# Patient Record
Sex: Male | Born: 1955 | Race: White | Hispanic: No | Marital: Married | Smoking: Former smoker
Health system: Southern US, Community
[De-identification: ages and names within clinical notes are randomized; demographics above are authoritative.]

## PROBLEM LIST (undated history)

## (undated) DIAGNOSIS — G43909 Migraine, unspecified, not intractable, without status migrainosus: Secondary | ICD-10-CM

## (undated) DIAGNOSIS — N189 Chronic kidney disease, unspecified: Secondary | ICD-10-CM

## (undated) DIAGNOSIS — K635 Polyp of colon: Secondary | ICD-10-CM

## (undated) DIAGNOSIS — T7840XA Allergy, unspecified, initial encounter: Secondary | ICD-10-CM

## (undated) DIAGNOSIS — G3184 Mild cognitive impairment, so stated: Secondary | ICD-10-CM

## (undated) DIAGNOSIS — C801 Malignant (primary) neoplasm, unspecified: Secondary | ICD-10-CM

## (undated) DIAGNOSIS — R208 Other disturbances of skin sensation: Secondary | ICD-10-CM

## (undated) DIAGNOSIS — I10 Essential (primary) hypertension: Secondary | ICD-10-CM

## (undated) HISTORY — DX: Migraine, unspecified, not intractable, without status migrainosus: G43.909

## (undated) HISTORY — PX: VARICOSE VEIN SURGERY: SHX832

## (undated) HISTORY — DX: Malignant (primary) neoplasm, unspecified: C80.1

## (undated) HISTORY — DX: Chronic kidney disease, unspecified: N18.9

## (undated) HISTORY — DX: Essential (primary) hypertension: I10

## (undated) HISTORY — DX: Mild cognitive impairment, so stated: G31.84

## (undated) HISTORY — DX: Allergy, unspecified, initial encounter: T78.40XA

## (undated) HISTORY — DX: Other disturbances of skin sensation: R20.8

## (undated) HISTORY — DX: Polyp of colon: K63.5

---
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
## 1999-07-15 DIAGNOSIS — C4491 Basal cell carcinoma of skin, unspecified: Secondary | ICD-10-CM

## 1999-07-15 HISTORY — DX: Basal cell carcinoma of skin, unspecified: C44.91

## 2009-04-06 HISTORY — PX: OTHER SURGICAL HISTORY: SHX169

## 2009-04-18 ENCOUNTER — Encounter: Admission: RE | Admit: 2009-04-18 | Discharge: 2009-04-18 | Payer: Self-pay | Admitting: Orthopedic Surgery

## 2010-04-06 HISTORY — PX: ABDOMINAL HERNIA REPAIR: SHX539

## 2010-04-28 ENCOUNTER — Encounter
Admission: RE | Admit: 2010-04-28 | Discharge: 2010-04-28 | Payer: Self-pay | Source: Home / Self Care | Attending: General Surgery | Admitting: General Surgery

## 2010-11-10 ENCOUNTER — Other Ambulatory Visit: Payer: Self-pay | Admitting: *Deleted

## 2010-11-10 DIAGNOSIS — M545 Low back pain, unspecified: Secondary | ICD-10-CM

## 2010-11-16 ENCOUNTER — Ambulatory Visit
Admission: RE | Admit: 2010-11-16 | Discharge: 2010-11-16 | Disposition: A | Discharge status: Home / Self Care | Payer: BC Managed Care – PPO | Source: Ambulatory Visit | Attending: *Deleted | Admitting: *Deleted

## 2010-11-16 DIAGNOSIS — M545 Low back pain, unspecified: Secondary | ICD-10-CM

## 2011-04-07 DIAGNOSIS — C801 Malignant (primary) neoplasm, unspecified: Secondary | ICD-10-CM

## 2011-04-07 HISTORY — DX: Malignant (primary) neoplasm, unspecified: C80.1

## 2011-07-14 DIAGNOSIS — D229 Melanocytic nevi, unspecified: Secondary | ICD-10-CM

## 2011-07-14 HISTORY — DX: Melanocytic nevi, unspecified: D22.9

## 2011-09-08 ENCOUNTER — Ambulatory Visit: Payer: BC Managed Care – PPO | Admitting: Family Medicine

## 2011-09-09 ENCOUNTER — Encounter: Payer: Self-pay | Admitting: Family Medicine

## 2011-09-09 ENCOUNTER — Ambulatory Visit (INDEPENDENT_AMBULATORY_CARE_PROVIDER_SITE_OTHER): Payer: BC Managed Care – PPO | Admitting: Family Medicine

## 2011-09-09 VITALS — BP 160/110 | HR 72 | Temp 97.7°F | Resp 12 | Ht 72.0 in | Wt 209.0 lb

## 2011-09-09 DIAGNOSIS — N200 Calculus of kidney: Secondary | ICD-10-CM

## 2011-09-09 DIAGNOSIS — K635 Polyp of colon: Secondary | ICD-10-CM | POA: Insufficient documentation

## 2011-09-09 DIAGNOSIS — F419 Anxiety disorder, unspecified: Secondary | ICD-10-CM | POA: Insufficient documentation

## 2011-09-09 DIAGNOSIS — C4491 Basal cell carcinoma of skin, unspecified: Secondary | ICD-10-CM | POA: Insufficient documentation

## 2011-09-09 DIAGNOSIS — I10 Essential (primary) hypertension: Secondary | ICD-10-CM

## 2011-09-09 DIAGNOSIS — F411 Generalized anxiety disorder: Secondary | ICD-10-CM

## 2011-09-09 DIAGNOSIS — D126 Benign neoplasm of colon, unspecified: Secondary | ICD-10-CM

## 2011-09-09 MED ORDER — LISINOPRIL-HYDROCHLOROTHIAZIDE 10-12.5 MG PO TABS: 1.0000 | ORAL_TABLET | Freq: Every day | ORAL | Status: DC

## 2011-09-09 NOTE — Patient Instructions (Signed)

## 2011-09-09 NOTE — Progress Notes (Signed)
  Subjective:    Patient ID: Micheal Graham, male    DOB: 11/29/1955, 56 y.o.   MRN: [id number]  HPI  New patient to establish care. Patient was seen recently by his primary care physician in another city [place]) and diagnosed with severe hypertension. He had presented there with increasing headaches. Blood pressure reportedly 217/134 5 days ago. Also reportedly had elevated heart rate around 110. Patient was placed on metoprolol 100 mg and headaches have improved. Home blood pressure readings ranging 453-646 systolic. Patient reportedly had lab work done last week and was told was normal. We do not have a copy of his labs. He had previously been diagnosed with mild hypertension several years ago and briefly took low-dose metoprolol then.  Question of social anxiety disorder. He was recently placed by other physician on Xanax 0.5 mg 3 times a day though he states he does not generally feel anxious most days, only in certain situations.  Other medical problems include history of kidney stones, colon polyps, and basal cell skin cancer. Recent dysplastic nevus removed from right leg.  Surgical history significant for appendectomy, varicose vein surgery, right knee arthroscopy, and abdominal hernia surgery.  Family history father had heart disease his 41s. Mother with hypertension. Paternal grandfather with stroke.  Patient is married. Ex-smoker. Quit 2001. Only occasional alcohol use. Walks occasionally for exercise. Works for [employer].   Review of Systems  Constitutional: Negative for fever, chills, activity change and appetite change.  HENT: Negative for trouble swallowing.   Eyes: Negative for visual disturbance.  Respiratory: Negative for cough, shortness of breath and wheezing.   Cardiovascular: Negative for chest pain, palpitations and leg swelling.  Gastrointestinal: Negative for abdominal pain.  Neurological: Positive for headaches. Negative for dizziness, syncope and weakness.    Hematological: Negative for adenopathy. Does not bruise/bleed easily.  Psychiatric/Behavioral: Negative for dysphoric mood. The patient is nervous/anxious.        Objective:   Physical Exam  Constitutional: He is oriented to person, place, and time. He appears well-developed and well-nourished.  HENT:  Mouth/Throat: Oropharynx is clear and moist.  Neck: Neck supple. No thyromegaly present.       No carotid bruits  Cardiovascular: Normal rate and regular rhythm.   Pulmonary/Chest: Effort normal and breath sounds normal. No respiratory distress. He has no wheezes. He has no rales.  Abdominal: Soft. He exhibits no distension and no mass. There is no tenderness. There is no rebound and no guarding.       No abdominal bruits  Musculoskeletal: He exhibits no edema.  Lymphadenopathy:    He has no cervical adenopathy.  Neurological: He is alert and oriented to person, place, and time. No cranial nerve deficit.  Psychiatric: He has a normal mood and affect. His behavior is normal.          Assessment & Plan:  #1 severe hypertension. Still considerably elevated today. Send for old labs. Baseline EKG. Start lisinopril HCTZ and continue metoprolol. Reassess him in 2 weeks. No heavy exertional activity until then. #2 history of basal cell skin cancers followed by dermatology  #3 history of colon polyps #4 history of kidney stones  EKG shows sinus rhythm with no acute changes

## 2011-09-22 ENCOUNTER — Encounter (HOSPITAL_COMMUNITY): Payer: Self-pay | Admitting: Emergency Medicine

## 2011-09-22 DIAGNOSIS — R51 Headache: Secondary | ICD-10-CM | POA: Insufficient documentation

## 2011-09-22 DIAGNOSIS — N289 Disorder of kidney and ureter, unspecified: Secondary | ICD-10-CM | POA: Insufficient documentation

## 2011-09-22 DIAGNOSIS — I10 Essential (primary) hypertension: Secondary | ICD-10-CM | POA: Insufficient documentation

## 2011-09-22 LAB — DIFFERENTIAL
Basophils Absolute: 0 10*3/uL (ref 0.0–0.1)
Basophils Relative: 0 % (ref 0–1)
Eosinophils Absolute: 0.1 10*3/uL (ref 0.0–0.7)
Eosinophils Relative: 1 % (ref 0–5)
Lymphocytes Relative: 16 % (ref 12–46)
Lymphs Abs: 1.7 10*3/uL (ref 0.7–4.0)
Monocytes Absolute: 0.5 10*3/uL (ref 0.1–1.0)
Monocytes Relative: 5 % (ref 3–12)
Neutro Abs: 7.9 10*3/uL — ABNORMAL HIGH (ref 1.7–7.7)
Neutrophils Relative %: 77 % (ref 43–77)

## 2011-09-22 LAB — BASIC METABOLIC PANEL
BUN: 29 mg/dL — ABNORMAL HIGH (ref 6–23)
CO2: 24 mEq/L (ref 19–32)
Calcium: 9.1 mg/dL (ref 8.4–10.5)
Chloride: 98 mEq/L (ref 96–112)
Creatinine, Ser: 1.68 mg/dL — ABNORMAL HIGH (ref 0.50–1.35)
GFR calc Af Amer: 51 mL/min — ABNORMAL LOW (ref >90)
GFR calc non Af Amer: 44 mL/min — ABNORMAL LOW (ref >90)
Glucose, Bld: 101 mg/dL — ABNORMAL HIGH (ref 70–99)
Potassium: 4.6 mEq/L (ref 3.5–5.1)
Sodium: 133 mEq/L — ABNORMAL LOW (ref 135–145)

## 2011-09-22 LAB — CBC
HCT: 39.5 % (ref 39.0–52.0)
Hemoglobin: 13.5 g/dL (ref 13.0–17.0)
MCH: 30.3 pg (ref 26.0–34.0)
MCHC: 34.2 g/dL (ref 30.0–36.0)
MCV: 88.6 fL (ref 78.0–100.0)
Platelets: 221 10*3/uL (ref 150–400)
RBC: 4.46 MIL/uL (ref 4.22–5.81)
RDW: 14.3 % (ref 11.5–15.5)
WBC: 10.3 10*3/uL (ref 4.0–10.5)

## 2011-09-22 NOTE — ED Notes (Signed)
Patient with headache since yesterday at 6pm.  Patient having dizziness, feeling weak, nauseated and feels like he might pass out.  Patient is flushed.  Mild photophobia.

## 2011-09-23 ENCOUNTER — Emergency Department (HOSPITAL_COMMUNITY)
Admission: EM | Admit: 2011-09-23 | Discharge: 2011-09-23 | Disposition: A | Discharge status: Home / Self Care | Payer: BC Managed Care – PPO | Attending: Emergency Medicine | Admitting: Emergency Medicine

## 2011-09-23 ENCOUNTER — Ambulatory Visit (INDEPENDENT_AMBULATORY_CARE_PROVIDER_SITE_OTHER): Payer: BC Managed Care – PPO | Admitting: Family Medicine

## 2011-09-23 ENCOUNTER — Encounter: Payer: Self-pay | Admitting: Family Medicine

## 2011-09-23 ENCOUNTER — Emergency Department (HOSPITAL_COMMUNITY): Payer: BC Managed Care – PPO

## 2011-09-23 ENCOUNTER — Encounter (HOSPITAL_COMMUNITY): Payer: Self-pay | Admitting: Radiology

## 2011-09-23 VITALS — BP 120/86 | Temp 98.0°F | Wt 203.0 lb

## 2011-09-23 DIAGNOSIS — I10 Essential (primary) hypertension: Secondary | ICD-10-CM

## 2011-09-23 DIAGNOSIS — R7989 Other specified abnormal findings of blood chemistry: Secondary | ICD-10-CM

## 2011-09-23 DIAGNOSIS — R42 Dizziness and giddiness: Secondary | ICD-10-CM

## 2011-09-23 DIAGNOSIS — N289 Disorder of kidney and ureter, unspecified: Secondary | ICD-10-CM

## 2011-09-23 DIAGNOSIS — R51 Headache: Secondary | ICD-10-CM

## 2011-09-23 DIAGNOSIS — R799 Abnormal finding of blood chemistry, unspecified: Secondary | ICD-10-CM

## 2011-09-23 MED ORDER — NAPROXEN 500 MG PO TABS: 500.0000 mg | ORAL_TABLET | Freq: Two times a day (BID) | ORAL | Status: DC

## 2011-09-23 MED ORDER — LISINOPRIL 10 MG PO TABS: 10.0000 mg | ORAL_TABLET | Freq: Every day | ORAL | Status: DC

## 2011-09-23 NOTE — Progress Notes (Signed)
  Subjective:    Patient ID: Micheal Graham, male    DOB: 05/10/1955, 56 y.o.   MRN: [id number]  HPI  Patient seen for followup. Went to emergency room last night with severe headache. More generalized headache. CT of head unremarkable. Had been seen here recently with severe hypertension. Had been already started on metoprolol and we added lisinopril HCTZ.   After just two days had some dizziness due to orthostatic type symptoms. He had some standing blood pressures around 532 systolic at home. Headache is fully resolved this time.    Patient had CT head unremarkable. Labs significant for creatinine 1.68 which is up from recent creatinine in late May of 1.17. Patient might have been somewhat dehydrated because of headache issues. His BUN was also elevated at 29. Sodium 133. CBC normal. Potassium normal.  Past Medical History  Diagnosis Date  . 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 Allergy   . Hypertension   . Chronic kidney disease   . Migraines   . Colon polyps    Past Surgical History  Procedure Date  . Appendectomy 1974    reports that he quit smoking about 12 years ago. His smoking use included Cigarettes. He has a 60 pack-year smoking history. He does not have any smokeless tobacco history on file. He reports that he does not drink alcohol or use illicit drugs. family history includes Cancer in his maternal uncle; Heart disease in his father; Hypertension in his mother; and Stroke in his paternal grandfather. No Known Allergies   Review of Systems  Constitutional: Negative for fatigue.  Eyes: Negative for visual disturbance.  Respiratory: Negative for cough, chest tightness and shortness of breath.   Cardiovascular: Negative for chest pain, palpitations and leg swelling.  Neurological: Negative for dizziness, syncope, weakness, light-headedness and headaches (none today).       Objective:   Physical Exam  Constitutional: He is oriented to person, place, and time. He appears  well-developed and well-nourished.  Neck: Neck supple. No thyromegaly present.  Cardiovascular: Normal rate and regular rhythm.  Exam reveals no gallop.   Pulmonary/Chest: Effort normal and breath sounds normal. No respiratory distress. He has no wheezes. He has no rales.  Lymphadenopathy:    He has no cervical adenopathy.  Neurological: He is alert and oriented to person, place, and time. No cranial nerve deficit.  Psychiatric: He has a normal mood and affect. His behavior is normal.          Assessment & Plan:  Severe hypertension tremendously improved. In fact he now has some orthostatic symptoms intermittently. Sitting blood pressure left arm today 101/68 and decreased to 90/60 standing. Discontinue lisinopril HCTZ and continue lisinopril 10 mg daily and Toprol. Recent elevation in creatinine-recheck basic metabolic panel in 2 weeks. Suspect this was dehydration related.  If creatinine still elevated at that point stop ACE inhibitor.

## 2011-09-23 NOTE — ED Notes (Signed)
Patient with dizziness, headache and feeling like he was going to pass out.  Patient has been diagnosed of recent hypertension and on two new medications.  Patient with equal hand grips, pedal pushes, no slurred speech, no facial droop.  Patient is CAOx3, family at bedside.

## 2011-09-23 NOTE — Discharge Instructions (Signed)
Your CT scan was normal, your blood work show that your kidneys are not working perfectly. I have attached her laboratory data so that he can shared this with your physician this week. Return to the hospital for severe or worsening pain, weakness, numbness or change in vision. Please take the Naprosyn twice a day to prevent headaches from coming on.

## 2011-09-23 NOTE — ED Provider Notes (Signed)
History     CSN: [id number]  Arrival date & time 09/22/11  1955   First MD Initiated Contact with Patient 09/23/11 0036      Chief Complaint  Patient presents with  . Dizziness  . Headache  . Blurred Vision    (Consider location/radiation/quality/duration/timing/severity/associated sxs/prior treatment) HPI Comments: 56 year old male with a history of recent diagnosis of hypertension who has started both a beta blocker, ACE inhibitor and hydrochlorothiazide in the last 3 weeks. He states that over the last 2 days he has had an intermittent headache, it starts in his right temporal area, spreads across his head and in supple and the left temporal area. This resolved spontaneously while he was waiting in the waiting room this evening. He admits to having associated strange symptoms in his eyes, nausea and a feeling of his whole body going weak though he did not notice that it was actually weak. He states he felt like he could not use his arms or legs right though they performed normal when he tried to function. There was no difficulty ambulating, has been no fevers, no stiff neck, at this time he has no complaints except for a feeling of a mild numbness on the top of his head. He denies chest pain, neck pain, back pain, swelling, rashes, belly pain. He does not have a history of seizures, no history of migraines, no history of trauma to the head.  Patient is a 56 y.o. male presenting with headaches. The history is provided by the patient, the spouse and a relative.  Headache     Past Medical History  Diagnosis Date  . 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 Allergy   . Hypertension   . Chronic kidney disease   . Migraines   . Colon polyps     Past Surgical History  Procedure Date  . Appendectomy 1974    Family History  Problem Relation Age of Onset  . Hypertension Mother   . Heart disease Father   . Cancer Maternal Uncle     prostate  . Stroke Paternal Grandfather     History    Substance Use Topics  . Smoking status: Former Smoker -- 2.0 packs/day for 30 years    Types: Cigarettes    Quit date: 09/09/1999  . Smokeless tobacco: Not on file  . Alcohol Use: No      Review of Systems  Neurological: Positive for headaches.  All other systems reviewed and are negative.    Allergies  Review of patient's allergies indicates no known allergies.  Home Medications   Current Outpatient Rx  Name Route Sig Dispense Refill  . ALPRAZOLAM 0.5 MG PO TABS Oral Take 0.5 mg by mouth 3 (three) times daily as needed. anxiety    . FEXOFENADINE HCL 180 MG PO TABS Oral Take 180 mg by mouth daily.    Marland Kitchen LISINOPRIL-HYDROCHLOROTHIAZIDE 10-12.5 MG PO TABS Oral Take 1 tablet by mouth daily. 30 tablet 11  . METOPROLOL SUCCINATE ER 100 MG PO TB24 Oral Take 100 mg by mouth daily. Take with or immediately following a meal.    . NAPROXEN 500 MG PO TABS Oral Take 1 tablet (500 mg total) by mouth 2 (two) times daily with a meal. 30 tablet 0    BP 130/77  Pulse 64  Temp 97.9 F (36.6 C) (Oral)  Resp 19  SpO2 100%  Physical Exam  Nursing note and vitals reviewed. Constitutional: He appears well-developed and well-nourished. No distress.  HENT:  Head: Normocephalic and atraumatic.  Mouth/Throat: Oropharynx is clear and moist. No oropharyngeal exudate.  Eyes: Conjunctivae and EOM are normal. Pupils are equal, round, and reactive to light. Right eye exhibits no discharge. Left eye exhibits no discharge. No scleral icterus.  Neck: Normal range of motion. Neck supple. No JVD present. No thyromegaly present.  Cardiovascular: Normal rate, regular rhythm, normal heart sounds and intact distal pulses.  Exam reveals no gallop and no friction rub.   No murmur heard. Pulmonary/Chest: Effort normal and breath sounds normal. No respiratory distress. He has no wheezes. He has no rales.  Abdominal: Soft. Bowel sounds are normal. He exhibits no distension and no mass. There is no tenderness.   Musculoskeletal: Normal range of motion. He exhibits no edema and no tenderness.  Lymphadenopathy:    He has no cervical adenopathy.  Neurological: He is alert. Coordination normal.       Neurologic exam:  Speech clear, pupils equal round reactive to light, extraocular movements intact  Normal peripheral visual fields Cranial nerves III through XII normal including no facial droop Follows commands, moves all extremities x4, normal strength to bilateral upper and lower extremities at all major muscle groups including grip Sensation normal to light touch and pinprick Coordination intact, no limb ataxia, finger-nose-finger normal Rapid alternating movements normal No pronator drift Gait normal   Skin: Skin is warm and dry. No rash noted. No erythema.  Psychiatric: He has a normal mood and affect. His behavior is normal.    ED Course  Procedures (including critical care time)  Labs Reviewed  DIFFERENTIAL - Abnormal; Notable for the following:    Neutro Abs 7.9 (*)     All other components within normal limits  BASIC METABOLIC PANEL - Abnormal; Notable for the following:    Sodium 133 (*)     Glucose, Bld 101 (*)     BUN 29 (*)     Creatinine, Ser 1.68 (*)     GFR calc non Af Amer 44 (*)     GFR calc Af Amer 51 (*)     All other components within normal limits  CBC   Ct Head Wo Contrast  09/23/2011  *RADIOLOGY REPORT*  Clinical Data: Severe headache.  CT HEAD WITHOUT CONTRAST  Technique:  Contiguous axial images were obtained from the base of the skull through the vertex without contrast.  Comparison: None.  Findings: There is no evidence of acute infarction, mass lesion, or intra- or extra-axial hemorrhage on CT.  Mild periventricular white matter change likely reflects small vessel ischemic microangiopathy.  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  The posterior fossa, including the cerebellum, brainstem and fourth ventricle, is within normal  limits.  The third and lateral ventricles, and basal ganglia are unremarkable in appearance.  The cerebral hemispheres are symmetric in appearance, with normal gray- white differentiation.  No mass effect or midline shift is seen.  There is no evidence of fracture; visualized osseous structures are unremarkable in appearance.  The visualized portions of the orbits are within normal limits.  The paranasal sinuses and mastoid air cells are well-aerated.  No significant soft tissue abnormalities are seen.  IMPRESSION:  1.  No acute intracranial pathology seen on CT. 2.  Mild small vessel ischemic microangiopathy,  Original Report Authenticated By: Santa Lighter, M.D.     1. Headache   2. Renal insufficiency       MDM  At this time the patient appears  stable, has normal vital signs other than a blood pressure of 158/75. He has a normal neurologic exam, clear heart and lung sounds and has no headache. Due to new onset of headache over 46 years old which has been associated with some neurologic signs this evening will obtain a CT scan of the head. He declines pain medications as he is not having any pain at this time. He has a followup with his doctor tomorrow to further evaluate his medications for his recent onset of hypertension.  CT negative, I especially reviewed this and find her to be no signs of hemorrhage, mass or stroke. I have reevaluated the patient at this time he has no headache, no symptoms, normal vital signs including blood pressure and appears stable for discharge. I have informed him of his results including his renal insufficiency. He will followup with his family doctor this week for a recheck.  Discharge Prescriptions include:  Naprosyn   Johnna Acosta, MD 09/23/11 [phone]

## 2011-10-16 ENCOUNTER — Telehealth: Payer: Self-pay | Admitting: Family Medicine

## 2011-10-16 NOTE — Telephone Encounter (Signed)
Pts spouse called req to get Kidney Function results from labs and any other lab results avail. Pt said that these labs were done at [facility] on 10/07/11 or 10/09/11. Leave detailed message if not avail.

## 2011-10-16 NOTE — Telephone Encounter (Signed)
Pt is aware MD out of office

## 2011-10-19 NOTE — Telephone Encounter (Signed)
Patient called to check status on getting his lab results. Please assist.

## 2011-10-19 NOTE — Telephone Encounter (Signed)
Pt was at work, [employer] on chart, pt wife Nevin Bloodgood informed

## 2011-10-19 NOTE — Telephone Encounter (Signed)
I do not see these labs scanned to chart yet, perhaps they are in your reports to review

## 2011-10-19 NOTE — Telephone Encounter (Signed)
Just signed off this AM.  They should be scanned in soon.  I do not recall any abnormalities.

## 2011-12-18 ENCOUNTER — Ambulatory Visit (INDEPENDENT_AMBULATORY_CARE_PROVIDER_SITE_OTHER): Payer: BC Managed Care – PPO | Admitting: Family Medicine

## 2011-12-18 ENCOUNTER — Encounter: Payer: Self-pay | Admitting: Family Medicine

## 2011-12-18 VITALS — BP 160/90 | Temp 98.2°F | Wt 212.0 lb

## 2011-12-18 DIAGNOSIS — I10 Essential (primary) hypertension: Secondary | ICD-10-CM

## 2011-12-18 MED ORDER — AMLODIPINE BESY-BENAZEPRIL HCL 5-10 MG PO CAPS: 1.0000 | ORAL_CAPSULE | Freq: Every day | ORAL | Status: DC

## 2011-12-18 NOTE — Progress Notes (Signed)
  Subjective:    Patient ID: Micheal Graham, male    DOB: Jun 23, 1955, 56 y.o.   MRN: [id number]  HPI  Followup hypertension. Refer to recent notes. Seen here with severe elevation of blood pressure initially and was taking only Toprol at that time. WE started lisinopril HCTZ and he had subsequent bump in creatinine and some dehydration issues and orthostasis. We transitioned to plain lisinopril. Tolerated well but blood pressures been inching back up. Has had several readings of 648E systolic and 72W diastolic. His headaches had improved greatly but a few mild headaches recently as blood pressure has increased. He stays quite active. No chest pains. No dyspnea. No orthostasis. No peripheral edema.   Review of Systems  Constitutional: Negative for fatigue.  Eyes: Negative for visual disturbance.  Respiratory: Negative for cough, chest tightness and shortness of breath.   Cardiovascular: Negative for chest pain, palpitations and leg swelling.  Neurological: Positive for headaches. Negative for dizziness, syncope, weakness and light-headedness.       Objective:   Physical Exam  Constitutional: He appears well-developed and well-nourished.  Neck: Neck supple. No thyromegaly present.  Cardiovascular: Normal rate and regular rhythm.   No murmur heard. Pulmonary/Chest: Effort normal and breath sounds normal. No respiratory distress. He has no wheezes. He has no rales.  Musculoskeletal: He exhibits no edema.          Assessment & Plan:  Hypertension. Increasing  Blood pressure over the past few weeks. Transition lisinopril to Lotrel 5/10 mg one daily. Reviewed possible side effects. Closely monitor the next couple weeks. Be in touch in 2-3 weeks if blood pressure is not consistently less than 140/90. Continue metoprolol

## 2011-12-18 NOTE — Patient Instructions (Signed)
Touch base in 2 weeks if BP not consistently < 140/90

## 2011-12-24 ENCOUNTER — Ambulatory Visit: Payer: BC Managed Care – PPO | Admitting: Family Medicine

## 2012-01-26 ENCOUNTER — Telehealth: Payer: Self-pay | Admitting: Family Medicine

## 2012-01-26 NOTE — Telephone Encounter (Signed)
Caller: Paula/Spouse; Patient Name: Micheal Graham; PCP: Carolann Littler [facility]); Best Callback Phone Number: [phone] Nevin Bloodgood is calling for [name] who is at work. They want to know what he can take for congestion due to his HTN. Hx of allergies. Afebrile. Onset sx's approx 7-10 days ago.  Upper Respiratory Infections, all ED sx's ruled out. Gave homecare and discussed call back parameters per guideline.

## 2012-01-27 ENCOUNTER — Ambulatory Visit (INDEPENDENT_AMBULATORY_CARE_PROVIDER_SITE_OTHER): Payer: BC Managed Care – PPO | Admitting: Family Medicine

## 2012-01-27 ENCOUNTER — Encounter: Payer: Self-pay | Admitting: Family Medicine

## 2012-01-27 VITALS — BP 130/70 | Temp 98.2°F | Wt 216.0 lb

## 2012-01-27 DIAGNOSIS — J209 Acute bronchitis, unspecified: Secondary | ICD-10-CM

## 2012-01-27 MED ORDER — AZITHROMYCIN 250 MG PO TABS: ORAL_TABLET | ORAL | Status: AC

## 2012-01-27 NOTE — Progress Notes (Signed)
  Subjective:    Patient ID: Micheal Graham, male    DOB: 06-Jan-1956, 56 y.o.   MRN: [id number]  HPI  Acute visit. Onset last weekend of cough and sore throat and now has sinus pressure and greenish nasal discharge. Symptoms are worsening. Chills off and on but no documented fever. He's had body aches and malaise. Quit smoking about 12 years ago. No headaches  Review of Systems  Constitutional: Positive for chills and fatigue.  HENT: Positive for congestion and sore throat.   Respiratory: Positive for cough. Negative for shortness of breath.        Objective:   Physical Exam  Constitutional: He appears well-developed and well-nourished.  HENT:  Right Ear: External ear normal.  Left Ear: External ear normal.       Oropharynx slightly erythematous. No exudate  Neck: Neck supple.  Cardiovascular: Normal rate and regular rhythm.   Pulmonary/Chest: Effort normal and breath sounds normal. No respiratory distress. He has no wheezes. He has no rales.  Lymphadenopathy:    He has no cervical adenopathy.          Assessment & Plan:  Acute bronchitis. Question viral. Patient feels symptoms are worsening after several days. Start Zithromax. Followup promptly for any fever worsening symptoms.

## 2012-01-27 NOTE — Patient Instructions (Addendum)

## 2012-02-09 ENCOUNTER — Other Ambulatory Visit: Payer: Self-pay | Admitting: *Deleted

## 2012-02-09 MED ORDER — AMLODIPINE BESY-BENAZEPRIL HCL 5-10 MG PO CAPS: 1.0000 | ORAL_CAPSULE | Freq: Every day | ORAL | Status: DC

## 2012-04-19 ENCOUNTER — Ambulatory Visit (INDEPENDENT_AMBULATORY_CARE_PROVIDER_SITE_OTHER): Payer: BC Managed Care – PPO | Admitting: Family Medicine

## 2012-04-19 ENCOUNTER — Encounter: Payer: Self-pay | Admitting: Family Medicine

## 2012-04-19 VITALS — BP 138/78 | Temp 98.6°F | Wt 218.0 lb

## 2012-04-19 DIAGNOSIS — I10 Essential (primary) hypertension: Secondary | ICD-10-CM

## 2012-04-19 MED ORDER — AMLODIPINE BESY-BENAZEPRIL HCL 5-20 MG PO CAPS: 1.0000 | ORAL_CAPSULE | Freq: Every day | ORAL | Status: DC

## 2012-04-19 NOTE — Patient Instructions (Addendum)
Monitor blood pressure and be in touch if BP consistently > 150/90

## 2012-04-19 NOTE — Progress Notes (Signed)
  Subjective:    Patient ID: Micheal Graham, male    DOB: 10-25-1955, 57 y.o.   MRN: [id number]  HPI  Followup hypertension. Patient takes metoprolol and amlodipine /benazepril. Recently has had several blood pressure readings up in the 553 systolic and high 74M to 27M diastolic. He had increased stress and anxiety with work and caring for his wife who has chronic back difficulties. He thinks this may be raising his blood pressure. Has felt more anxious. He takes alprazolam but rarely. Only getting about 6-7 hours sleep per night because of work issues. Denies depression. Rare headaches. No chest pain. No dizziness. Compliant with therapy.  Past Medical History  Diagnosis Date  . 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 Allergy   . Hypertension   . Chronic kidney disease   . Migraines   . Colon polyps    Past Surgical History  Procedure Date  . Appendectomy 1974    reports that he quit smoking about 12 years ago. His smoking use included Cigarettes. He has a 60 pack-year smoking history. He does not have any smokeless tobacco history on file. He reports that he does not drink alcohol or use illicit drugs. family history includes Cancer in his maternal uncle; Heart disease in his father; Hypertension in his mother; and Stroke in his paternal grandfather. No Known Allergies   Review of Systems  Constitutional: Negative for fatigue.  Eyes: Negative for visual disturbance.  Respiratory: Negative for cough, chest tightness and shortness of breath.   Cardiovascular: Negative for chest pain, palpitations and leg swelling.  Neurological: Negative for dizziness, syncope, weakness, light-headedness and headaches.       Objective:   Physical Exam  Constitutional: He appears well-developed and well-nourished.  Neck: Neck supple. No thyromegaly present.  Cardiovascular: Normal rate and regular rhythm.  Exam reveals no gallop.   Pulmonary/Chest: Effort normal and breath sounds normal. No respiratory  distress. He has no wheezes. He has no rales.  Musculoskeletal: He exhibits no edema.          Assessment & Plan:  Hypertension. Suboptimal control. Titrate Lotrel to 5/20 one daily. Continue metoprolol. He has followup in March. Monitor blood pressures and be in touch if consistently blood pressure greater than 150/90 over the next couple months

## 2012-06-17 ENCOUNTER — Ambulatory Visit (INDEPENDENT_AMBULATORY_CARE_PROVIDER_SITE_OTHER): Payer: BC Managed Care – PPO | Admitting: Family Medicine

## 2012-06-17 ENCOUNTER — Encounter: Payer: Self-pay | Admitting: Family Medicine

## 2012-06-17 VITALS — BP 120/68 | Temp 98.1°F | Wt 214.0 lb

## 2012-06-17 DIAGNOSIS — I10 Essential (primary) hypertension: Secondary | ICD-10-CM

## 2012-06-17 NOTE — Progress Notes (Signed)
  Subjective:    Patient ID: Micheal Graham, male    DOB: Feb 28, 1956, 57 y.o.   MRN: [id number]  HPI Hypertension  Last visit. Titrated Lotrel to 5/20 mg one daily Blood pressure is very contrast since then. No headaches or dizziness. Compliant with therapy. Generally feels well. Also remains on metoprolol. He's had several stress issues at home with wife's chronic illness  Past Medical History  Diagnosis Date  . 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 Allergy   . Hypertension   . Chronic kidney disease   . Migraines   . Colon polyps    Past Surgical History  Procedure Laterality Date  . Appendectomy  1974    reports that he quit smoking about 12 years ago. His smoking use included Cigarettes. He has a 60 pack-year smoking history. He does not have any smokeless tobacco history on file. He reports that he does not drink alcohol or use illicit drugs. family history includes Cancer in his maternal uncle; Heart disease in his father; Hypertension in his mother; and Stroke in his paternal grandfather. No Known Allergies    Review of Systems  Constitutional: Negative for fatigue.  Eyes: Negative for visual disturbance.  Respiratory: Negative for cough, chest tightness and shortness of breath.   Cardiovascular: Negative for chest pain, palpitations and leg swelling.  Neurological: Negative for dizziness, syncope, weakness, light-headedness and headaches.       Objective:   Physical Exam  Constitutional: He appears well-developed and well-nourished.  Cardiovascular: Normal rate and regular rhythm.   Pulmonary/Chest: Effort normal and breath sounds normal. No respiratory distress. He has no wheezes. He has no rales.  Musculoskeletal: He exhibits no edema.          Assessment & Plan:  Hypertension. Improved and at goal. Continue current medication. Routine followup 6 months

## 2012-06-17 NOTE — Patient Instructions (Addendum)

## 2012-07-15 ENCOUNTER — Ambulatory Visit (INDEPENDENT_AMBULATORY_CARE_PROVIDER_SITE_OTHER): Payer: BC Managed Care – PPO | Admitting: Family Medicine

## 2012-07-15 ENCOUNTER — Encounter: Payer: Self-pay | Admitting: Family Medicine

## 2012-07-15 VITALS — BP 120/70 | Temp 97.7°F | Wt 216.0 lb

## 2012-07-15 DIAGNOSIS — I10 Essential (primary) hypertension: Secondary | ICD-10-CM

## 2012-07-15 DIAGNOSIS — M25529 Pain in unspecified elbow: Secondary | ICD-10-CM

## 2012-07-15 DIAGNOSIS — M25521 Pain in right elbow: Secondary | ICD-10-CM

## 2012-07-15 NOTE — Patient Instructions (Signed)
REDUCE METOPROLOL TO ONE HALF TABLET AT NIGHT STAY WELL HYDRATED.

## 2012-07-15 NOTE — Progress Notes (Signed)
  Subjective:    Patient ID: Micheal Graham, male    DOB: Aug 29, 1955, 57 y.o.   MRN: [id number]  HPI  Seen for evaluation blood pressure tissues. Currently takes Lotrel 5/20 mg one daily and metoprolol 100 mg at night. Blood pressures have actually been very well controlled. One day this week he was moaning and blood pressure was 95/60. He felt slightly lightheaded but no syncope. No chest pains. No dyspnea. Some generalized fatigue. TSH last May was normal Denies any headaches. Good appetite. No major weight changes. Generally staying well hydrated  Patient also complains of some right anterior elbow pain. No injury. Possibly worse with lifting or gripping. No visible swelling. No ecchymosis. No shoulder neck pain. No alleviating factors. No weakness. Duration of symptoms is about 4-6 weeks  Past Medical History  Diagnosis Date  . 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 Allergy   . Hypertension   . Chronic kidney disease   . Migraines   . Colon polyps    Past Surgical History  Procedure Laterality Date  . Appendectomy  1974    reports that he quit smoking about 12 years ago. His smoking use included Cigarettes. He has a 60 pack-year smoking history. He does not have any smokeless tobacco history on file. He reports that he does not drink alcohol or use illicit drugs. family history includes Cancer in his maternal uncle; Heart disease in his father; Hypertension in his mother; and Stroke in his paternal grandfather. No Known Allergies   Review of Systems  Constitutional: Positive for fatigue. Negative for fever, activity change, appetite change and unexpected weight change.  HENT: Negative for ear pain.   Eyes: Negative for visual disturbance.  Respiratory: Negative for cough, chest tightness and shortness of breath.   Cardiovascular: Negative for chest pain, palpitations and leg swelling.  Gastrointestinal: Negative for abdominal pain.  Skin: Negative for rash.  Neurological: Positive  for dizziness and light-headedness. Negative for syncope, weakness and headaches.  Hematological: Negative for adenopathy.       Objective:   Physical Exam  Constitutional: He appears well-developed and well-nourished.  HENT:  Right Ear: External ear normal.  Left Ear: External ear normal.  Mouth/Throat: Oropharynx is clear and moist.  Neck: Neck supple. No thyromegaly present.  Cardiovascular: Normal rate and regular rhythm.  Exam reveals no gallop.   No murmur heard. Pulmonary/Chest: Effort normal and breath sounds normal. No respiratory distress. He has no wheezes. He has no rales.  Musculoskeletal: He exhibits no edema.  Normal Range of motion right elbow. No visible swelling. Minimal tenderness over distal biceps tendon. No pain with supination or pronation. No pain with flexion or extension of elbow. No pain with extension of wrist against resistance or flex her wrist against resistance. No lateral elbow tenderness.  Lymphadenopathy:    He has no cervical adenopathy.          Assessment & Plan:  Hypertension. Possible recent orthostasis. Blood pressure today sitting 105/63 and standing 105/60. Reduce metoprolol to 50 mg at night. Continue current dose of Lotrel. Stay well hydrated. Touch base one to 2 weeks if dizziness not resolving Right anterior elbow pain. Question tendinitis. Try icing and short-term use of anti-inflammatory.  Consider x-rays in one to 2 weeks if no improvement

## 2012-08-03 ENCOUNTER — Encounter: Payer: Self-pay | Admitting: Family Medicine

## 2012-08-08 ENCOUNTER — Encounter: Payer: Self-pay | Admitting: Family Medicine

## 2012-08-09 NOTE — Telephone Encounter (Signed)
I spoke with pt wife and was given [facility] phone number, [phone].  I was instructed to call and order, something new to keep cost down for pt. They would like MRI of the right elbow on June 9th at 8 am.  Diagnosis, right elbow pain, are you authorizing MRI?  I will call and arrange.

## 2012-08-11 ENCOUNTER — Telehealth: Payer: Self-pay | Admitting: *Deleted

## 2012-08-11 DIAGNOSIS — M25521 Pain in right elbow: Secondary | ICD-10-CM

## 2012-08-11 NOTE — Telephone Encounter (Signed)
Please see additional My-Chart encounters if needed. Pt reports he heard a snap in his right elbow area and is requesting an MRI.  "I don't feel an x-ray would show any of the surrounding ligaments properly.  I requested MRI be ordered at [facility].  We were not familiar with [facility], so I called pt wife.  It is a benefit of the [employer] with BC/BS.  I spoke with Alexander at [phone], ext [phone]. He will schedule the MRI at a local Imaging center and contact the pt with date/time etc. We can fax the MRI without contrast (or with contrast if you want a arthrogram).  Fax number [phone]  Pt requesting MRI on June 9 at 8am if possible as he will be on vacation.  Please advise with or without contrast MRI

## 2012-08-11 NOTE — Telephone Encounter (Signed)
OK to set up MRI right elbow without contrast.

## 2012-08-11 NOTE — Telephone Encounter (Signed)
I was finally able to talk both with pt and I called the [facility] center.  This is a benefit for pt of the [employer] with BC/BS.  We can fax an order for MRI Right Elbow without contrast, with contrast if you want an arthrogram.  Fax number [phone], and Sheppard Coil was my contact at ext [phone].  He will schedule at a local Imaging center and contact the pt and fax results to Dr Elease Hashimoto.   Are you approving the MRI of right elbow.  Pt wants scheduled on June 9 at 8am as he is on vacation

## 2012-08-24 ENCOUNTER — Telehealth: Payer: Self-pay | Admitting: *Deleted

## 2012-08-24 NOTE — Telephone Encounter (Signed)
Patient has been notified.  Orthopedic consult if symptoms persist.

## 2012-08-24 NOTE — Telephone Encounter (Signed)
I requested MRI report from [facility], received and on your desk for review

## 2012-08-24 NOTE — Telephone Encounter (Signed)
VM received from pt wife, " we are anxious to get the report of the MRI of elbow done at [place]"

## 2012-08-30 ENCOUNTER — Encounter: Payer: Self-pay | Admitting: Family Medicine

## 2012-11-16 ENCOUNTER — Encounter: Payer: Self-pay | Admitting: Family Medicine

## 2013-01-05 ENCOUNTER — Ambulatory Visit (INDEPENDENT_AMBULATORY_CARE_PROVIDER_SITE_OTHER): Payer: BC Managed Care – PPO | Admitting: Family Medicine

## 2013-01-05 ENCOUNTER — Encounter: Payer: Self-pay | Admitting: Family Medicine

## 2013-01-05 VITALS — BP 134/72 | HR 71 | Temp 98.0°F | Wt 216.0 lb

## 2013-01-05 DIAGNOSIS — J069 Acute upper respiratory infection, unspecified: Secondary | ICD-10-CM

## 2013-01-05 DIAGNOSIS — J029 Acute pharyngitis, unspecified: Secondary | ICD-10-CM

## 2013-01-05 DIAGNOSIS — I10 Essential (primary) hypertension: Secondary | ICD-10-CM

## 2013-01-05 DIAGNOSIS — F419 Anxiety disorder, unspecified: Secondary | ICD-10-CM

## 2013-01-05 DIAGNOSIS — F411 Generalized anxiety disorder: Secondary | ICD-10-CM

## 2013-01-05 LAB — POCT RAPID STREP A (OFFICE): Rapid Strep A Screen: NEGATIVE

## 2013-01-05 MED ORDER — SERTRALINE HCL 50 MG PO TABS: 50.0000 mg | ORAL_TABLET | Freq: Every day | ORAL | Status: DC

## 2013-01-05 MED ORDER — HYDROCODONE-HOMATROPINE 5-1.5 MG/5ML PO SYRP: 5.0000 mL | ORAL_SOLUTION | Freq: Four times a day (QID) | ORAL | Status: AC | PRN

## 2013-01-05 MED ORDER — AZITHROMYCIN 250 MG PO TABS: ORAL_TABLET | ORAL | Status: DC

## 2013-01-05 NOTE — Progress Notes (Signed)
  Subjective:    Patient ID: Micheal Graham, male    DOB: 1955/07/17, 57 y.o.   MRN: [id number]  HPI Acute visit Patient seen with onset of sore throat, cough, nasal congestion, malaise yesterday Cough productive of green sputum. No dyspnea. No confirmed fever. Cough has been severe at times. Not relieved with over-the-counter medications Patient is a nonsmoker.  Other issue is chronic pervasive daily anxiety symptoms. These do not occur at any specific situation. He frequently feels anxious (most days) and sometimes has physical manifestations such as tremor. No appetite or weight changes. No diarrhea. No tachycardia. Previously took Celexa which helped a bit he had some apparent weight gain. He has taken low-dose Xanax the past.  Past Medical History  Diagnosis Date  . 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 Allergy   . Hypertension   . Chronic kidney disease   . Migraines   . Colon polyps    Past Surgical History  Procedure Laterality Date  . Appendectomy  1974    reports that he quit smoking about 13 years ago. His smoking use included Cigarettes. He has a 60 pack-year smoking history. He does not have any smokeless tobacco history on file. He reports that he does not drink alcohol or use illicit drugs. family history includes Cancer in his maternal uncle; Heart disease in his father; Hypertension in his mother; Stroke in his paternal grandfather. No Known Allergies   Review of Systems  Constitutional: Positive for fatigue.  HENT: Positive for congestion and sore throat.   Eyes: Negative for visual disturbance.  Respiratory: Positive for cough. Negative for chest tightness, shortness of breath and wheezing.   Cardiovascular: Negative for chest pain, palpitations and leg swelling.  Neurological: Negative for dizziness, syncope, weakness, light-headedness and headaches.  Psychiatric/Behavioral: Negative for confusion, dysphoric mood and agitation. The patient is nervous/anxious.         Objective:   Physical Exam  Constitutional: He appears well-developed and well-nourished.  HENT:  Right Ear: External ear normal.  Left Ear: External ear normal.  Mouth/Throat: Oropharynx is clear and moist.  Neck: Neck supple. No thyromegaly present.  Cardiovascular: Normal rate and regular rhythm.   Pulmonary/Chest: Effort normal and breath sounds normal. No respiratory distress. He has no wheezes. He has no rales.  Psychiatric: He has a normal mood and affect. His behavior is normal. Judgment and thought content normal.          Assessment & Plan:  #1 acute bronchitis. We explained these are usually viral. Hycodan cough syrup 1 teaspoon each bedtime for severe cough. No antibiotics recommend this time but if he develops any fever worsening symptoms start Zithromax #2 chronic pervasive anxiety. Question generalized anxiety. Start sertraline 50 mg once daily. Reassess in 4 weeks. Discussed preference for avoiding benzodiazepines. #3 hypertension- stable and at goal.

## 2013-01-05 NOTE — Patient Instructions (Signed)

## 2013-01-23 ENCOUNTER — Telehealth: Payer: Self-pay | Admitting: Family Medicine

## 2013-01-23 MED ORDER — METOPROLOL SUCCINATE ER 100 MG PO TB24: 100.0000 mg | ORAL_TABLET | Freq: Every day | ORAL | Status: DC

## 2013-01-23 NOTE — Telephone Encounter (Signed)
Pt needs refill on  generic toprol  xl 100 mg #90 with refills call into [facility] [phone]

## 2013-01-23 NOTE — Telephone Encounter (Signed)
RX sent to pharmacy  

## 2013-01-25 ENCOUNTER — Telehealth: Payer: Self-pay | Admitting: Family Medicine

## 2013-01-25 NOTE — Telephone Encounter (Signed)
First he took his b/p and it was 88/60. Then 94/64 after waiting 2 hours. This morning 106/71. Lightheaded when he first stands up, patient is getting up slowly. Started on Zoloft 10 days ago. Thinks that is the reason why his b/p may be low. Pt stated that this happen one time in the spring and changed his toprol. Pt wants  To know if it okay if he cuts his toprol in half. Patient does not want to see to another provider, pt was informed that there are no openings today.

## 2013-01-25 NOTE — Telephone Encounter (Signed)
Pt informed

## 2013-01-25 NOTE — Telephone Encounter (Signed)
Sertraline does not typically lower blood pressure for any significant degree. Okay to reduce Toprol to one half tablet Stay well hydrated

## 2013-01-25 NOTE — Telephone Encounter (Signed)
Pt states his bp has gone down to double digits (94/64) . no appts.  pt refused to see another provider. Pt states this issue happened last summer. pls advise.

## 2013-02-09 ENCOUNTER — Encounter: Payer: Self-pay | Admitting: Family Medicine

## 2013-02-09 ENCOUNTER — Other Ambulatory Visit: Payer: Self-pay

## 2013-02-09 ENCOUNTER — Ambulatory Visit (INDEPENDENT_AMBULATORY_CARE_PROVIDER_SITE_OTHER): Payer: BC Managed Care – PPO | Admitting: Family Medicine

## 2013-02-09 VITALS — BP 132/70 | HR 61 | Temp 97.8°F | Wt 214.0 lb

## 2013-02-09 DIAGNOSIS — Z23 Encounter for immunization: Secondary | ICD-10-CM

## 2013-02-09 DIAGNOSIS — F411 Generalized anxiety disorder: Secondary | ICD-10-CM

## 2013-02-09 DIAGNOSIS — I10 Essential (primary) hypertension: Secondary | ICD-10-CM

## 2013-02-09 DIAGNOSIS — F419 Anxiety disorder, unspecified: Secondary | ICD-10-CM

## 2013-02-09 MED ORDER — SERTRALINE HCL 50 MG PO TABS: 50.0000 mg | ORAL_TABLET | Freq: Every day | ORAL | Status: DC

## 2013-02-09 NOTE — Progress Notes (Signed)
  Subjective:    Patient ID: Micheal Graham, male    DOB: 09-Jul-1955, 57 y.o.   MRN: [id number]  HPI Followup regarding anxiety issues. Refer to recent note. He's had daily pervasive anxiety for quite some time. He had been taking Xanax. We recommended sertraline and he has done extremely well. He's not experiencing the daily anxiety symptoms that were pervasive and interfering with his daily activities. He has not had any side effects other than mild decrease in libido.  Blood pressures been well controlled. Only rare episodes of orthostasis. Compliant with medications. No chest pains. Patient still needs flu vaccine.  Past Medical History  Diagnosis Date  . 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 Allergy   . Hypertension   . Chronic kidney disease   . Migraines   . Colon polyps    Past Surgical History  Procedure Laterality Date  . Appendectomy  1974    reports that he quit smoking about 13 years ago. His smoking use included Cigarettes. He has a 60 pack-year smoking history. He does not have any smokeless tobacco history on file. He reports that he does not drink alcohol or use illicit drugs. family history includes Cancer in his maternal uncle; Heart disease in his father; Hypertension in his mother; Stroke in his paternal grandfather. No Known Allergies    Review of Systems  Constitutional: Negative for fatigue.  Eyes: Negative for visual disturbance.  Respiratory: Negative for cough, chest tightness and shortness of breath.   Cardiovascular: Negative for chest pain, palpitations and leg swelling.  Neurological: Negative for dizziness, syncope, weakness, light-headedness and headaches.  Psychiatric/Behavioral: Negative for dysphoric mood.       Objective:   Physical Exam  Constitutional: He appears well-developed and well-nourished.  Cardiovascular: Normal rate and regular rhythm.   Pulmonary/Chest: Effort normal and breath sounds normal. No respiratory distress. He has no  wheezes. He has no rales.  Psychiatric: He has a normal mood and affect. His behavior is normal.          Assessment & Plan:  Chronic anxiety. Question generalized anxiety. Improved on sertraline. Refill for one year Hypertension. Well controlled. Continue current medications. Flu vaccine given. Routine followup 6 months

## 2013-02-09 NOTE — Addendum Note (Signed)
Addended by: Marcina Millard on: 02/09/2013 08:52 AM   Modules accepted: Orders

## 2013-03-23 ENCOUNTER — Encounter: Payer: Self-pay | Admitting: Family Medicine

## 2013-04-19 ENCOUNTER — Encounter: Payer: Self-pay | Admitting: Family Medicine

## 2013-04-19 ENCOUNTER — Telehealth: Payer: Self-pay

## 2013-04-19 ENCOUNTER — Telehealth: Payer: Self-pay | Admitting: Family Medicine

## 2013-04-19 NOTE — Telephone Encounter (Signed)
Pt needs refills on amlodipine-benazepril call into [facility] [place] [phone]

## 2013-04-19 NOTE — Telephone Encounter (Signed)
RX is sent to pharmacy

## 2013-04-19 NOTE — Telephone Encounter (Signed)
Refill Xanax  Last visit  02/09/13 No refills on the Xanax

## 2013-04-19 NOTE — Telephone Encounter (Signed)
I could not see that we had refilled.  This should only be used sparingly for severe anxiety. Refill #30 with no refill.

## 2013-04-20 MED ORDER — ALPRAZOLAM 0.5 MG PO TABS: 0.5000 mg | ORAL_TABLET | Freq: Three times a day (TID) | ORAL | Status: DC | PRN

## 2013-04-20 NOTE — Telephone Encounter (Signed)
RX called in to the pharmacy.

## 2013-06-07 ENCOUNTER — Encounter: Payer: Self-pay | Admitting: *Deleted

## 2013-06-08 ENCOUNTER — Encounter: Payer: Self-pay | Admitting: Family Medicine

## 2013-06-08 ENCOUNTER — Ambulatory Visit (INDEPENDENT_AMBULATORY_CARE_PROVIDER_SITE_OTHER): Payer: BC Managed Care – PPO | Admitting: Family Medicine

## 2013-06-08 VITALS — BP 130/78 | HR 82 | Wt 224.0 lb

## 2013-06-08 DIAGNOSIS — F419 Anxiety disorder, unspecified: Secondary | ICD-10-CM

## 2013-06-08 DIAGNOSIS — I10 Essential (primary) hypertension: Secondary | ICD-10-CM

## 2013-06-08 DIAGNOSIS — F411 Generalized anxiety disorder: Secondary | ICD-10-CM

## 2013-06-08 MED ORDER — SERTRALINE HCL 100 MG PO TABS: 100.0000 mg | ORAL_TABLET | Freq: Every day | ORAL | Status: DC

## 2013-06-08 NOTE — Patient Instructions (Signed)
Let me know in 2-3 weeks if anxiety and sleep are not improving.

## 2013-06-08 NOTE — Progress Notes (Signed)
   Subjective:    Patient ID: Micheal Graham, male    DOB: 09/05/1955, 58 y.o.   MRN: [id number]  HPI Patient here to discuss blood pressure and anxiety issues Blood pressures been very well controlled with amlodipine benazepril and metoprolol. No dizziness. No chest pains. No dyspnea. Compliant with therapy.  Patient complains of some anxiety symptoms which are almost daily in 10 to be late in the day. He takes occasional alprazolam it might for insomnia. On sertraline 50 mg daily. He's never been titrated higher doses. He does not relate anxiety symptoms to any particular triggers.  We considered generalized anxiety. He was initially doing well with the lower dose Zoloft. Denies any depressive symptoms. Still has some insomnia issues intermittently at night  Patient taking Tylenol PM without much success  Past Medical History  Diagnosis Date  . 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 Allergy   . Hypertension   . Chronic kidney disease   . Migraines   . Colon polyps    Past Surgical History  Procedure Laterality Date  . Appendectomy  1974    reports that he quit smoking about 13 years ago. His smoking use included Cigarettes. He has a 60 pack-year smoking history. He does not have any smokeless tobacco history on file. He reports that he does not drink alcohol or use illicit drugs. family history includes Cancer in his maternal uncle; Heart disease in his father; Hypertension in his mother; Stroke in his paternal grandfather. No Known Allergies    Review of Systems  Constitutional: Negative for fatigue.  Eyes: Negative for visual disturbance.  Respiratory: Negative for cough, chest tightness and shortness of breath.   Cardiovascular: Negative for chest pain, palpitations and leg swelling.  Genitourinary: Negative for dysuria.  Neurological: Negative for dizziness, syncope, weakness, light-headedness and headaches.  Psychiatric/Behavioral: Positive for sleep disturbance. Negative for  suicidal ideas, dysphoric mood and agitation. The patient is nervous/anxious.        Objective:   Physical Exam  Constitutional: He is oriented to person, place, and time. He appears well-developed and well-nourished.  Neck: Neck supple. No thyromegaly present.  Cardiovascular: Normal rate.  Exam reveals no gallop.   Pulmonary/Chest: Effort normal and breath sounds normal. No respiratory distress. He has no wheezes. He has no rales.  Musculoskeletal: He exhibits no edema.  Neurological: He is alert and oriented to person, place, and time.  Psychiatric: He has a normal mood and affect. His behavior is normal.          Assessment & Plan:  #1 hypertension. Well controlled. Continue current medications #2 pervasive anxiety. Question generalized anxiety. Titrate sertraline 100 mg once daily. Touch base in 2-3 weeks if not improved. #3 chronic insomnia. Sleep hygiene discussed. Very poor duration of sleep with the above. Consider low-dose clonazepam if symptoms persist

## 2013-06-08 NOTE — Progress Notes (Signed)
Pre visit review using our clinic review tool, if applicable. No additional management support is needed unless otherwise documented below in the visit note. 

## 2013-06-09 ENCOUNTER — Telehealth: Payer: Self-pay | Admitting: Family Medicine

## 2013-06-09 NOTE — Telephone Encounter (Signed)
Relevant patient education assigned to patient using Emmi. ° °

## 2013-06-16 ENCOUNTER — Encounter: Payer: Self-pay | Admitting: Family Medicine

## 2013-06-16 ENCOUNTER — Ambulatory Visit (INDEPENDENT_AMBULATORY_CARE_PROVIDER_SITE_OTHER): Payer: BC Managed Care – PPO | Admitting: Family Medicine

## 2013-06-16 VITALS — BP 136/76 | HR 60 | Wt 220.0 lb

## 2013-06-16 DIAGNOSIS — R5381 Other malaise: Secondary | ICD-10-CM

## 2013-06-16 DIAGNOSIS — R5383 Other fatigue: Secondary | ICD-10-CM

## 2013-06-16 DIAGNOSIS — G4700 Insomnia, unspecified: Secondary | ICD-10-CM

## 2013-06-16 MED ORDER — CLONAZEPAM 0.5 MG PO TABS: 0.5000 mg | ORAL_TABLET | Freq: Every evening | ORAL | Status: DC | PRN

## 2013-06-16 NOTE — Progress Notes (Signed)
Pre visit review using our clinic review tool, if applicable. No additional management support is needed unless otherwise documented below in the visit note. 

## 2013-06-16 NOTE — Progress Notes (Signed)
   Subjective:    Patient ID: Micheal Graham, male    DOB: 02-Jul-1955, 58 y.o.   MRN: [id number]  HPI Patient seen for followup regarding anxiety. We titrated his sertraline up to 100 mg. However, he had extreme lack of energy and difficulty getting out of bed. He feels he cannot tolerate this dose He continues to have some insomnia issues. He was sleeping somewhat better the first 2 days he took the sertraline. He only took this for a couple days. He has taken low-dose alprazolam in the past and frequently. No specific stressors. Refer to previous note for details.  Past Medical History  Diagnosis Date  . 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 Allergy   . Hypertension   . Chronic kidney disease   . Migraines   . Colon polyps    Past Surgical History  Procedure Laterality Date  . Appendectomy  1974    reports that he quit smoking about 13 years ago. His smoking use included Cigarettes. He has a 60 pack-year smoking history. He does not have any smokeless tobacco history on file. He reports that he does not drink alcohol or use illicit drugs. family history includes Cancer in his maternal uncle; Heart disease in his father; Hypertension in his mother; Stroke in his paternal grandfather. No Known Allergies    Review of Systems  Constitutional: Positive for fatigue.  Neurological: Negative for dizziness and headaches.       Objective:   Physical Exam  Constitutional: He is oriented to person, place, and time. He appears well-developed and well-nourished. No distress.  Cardiovascular: Normal rate and regular rhythm.   Pulmonary/Chest: Effort normal and breath sounds normal. No respiratory distress. He has no wheezes. He has no rales.  Neurological: He is alert and oriented to person, place, and time. No cranial nerve deficit.  Psychiatric: He has a normal mood and affect. His behavior is normal.          Assessment & Plan:  . Fatigue probably related to increased dose of sertraline.  We explained that people frequently develop tolerance to this type of side effects. However, he is not willing to give this further time. He will drop dosage back to 50 mg sertraline.  low-dose clonazepam 0.5 mg 1 each bedtime for severe insomnia. Discontinue alprazolam.

## 2013-10-08 ENCOUNTER — Encounter: Payer: Self-pay | Admitting: Family Medicine

## 2013-10-09 ENCOUNTER — Telehealth: Payer: Self-pay | Admitting: Family Medicine

## 2013-10-09 NOTE — Telephone Encounter (Signed)
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 Please fax to [facility] at [phone], pt states he needs today can not get his DOT card until he gets the note.

## 2013-10-09 NOTE — Telephone Encounter (Signed)
Left message on patient Vm. Tired faxing letter 3x and its coming back no answer.

## 2013-10-09 NOTE — Telephone Encounter (Signed)
Pt gave new fax # [phone], please re-send

## 2013-10-09 NOTE — Telephone Encounter (Signed)
Re-faxed letter with the new number still not going through.

## 2013-10-10 NOTE — Telephone Encounter (Signed)
Letter went through.

## 2013-10-11 ENCOUNTER — Telehealth: Payer: Self-pay | Admitting: Family Medicine

## 2013-10-11 NOTE — Telephone Encounter (Signed)
[employer] is doing the pt's DOT physical and they need pt letter changed.  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  The original letter needs to be changed from "to operate a vehicle" to " to operate a commercial vehicle" Letter needs to be faxed back to [place] at [phone] today Pt needs the letter today bc he cannot work until he gets his DOT card [employer] is going to fax back the original letter.

## 2013-10-11 NOTE — Telephone Encounter (Signed)
Changed letter and faxed letter to [facility]

## 2013-10-12 ENCOUNTER — Ambulatory Visit (INDEPENDENT_AMBULATORY_CARE_PROVIDER_SITE_OTHER): Payer: BC Managed Care – PPO | Admitting: Family Medicine

## 2013-10-12 ENCOUNTER — Encounter: Payer: Self-pay | Admitting: Family Medicine

## 2013-10-12 VITALS — BP 120/74 | HR 60 | Temp 98.0°F | Wt 225.0 lb

## 2013-10-12 DIAGNOSIS — S6000XA Contusion of unspecified finger without damage to nail, initial encounter: Secondary | ICD-10-CM

## 2013-10-12 DIAGNOSIS — S6010XA Contusion of unspecified finger with damage to nail, initial encounter: Secondary | ICD-10-CM

## 2013-10-12 DIAGNOSIS — R809 Proteinuria, unspecified: Secondary | ICD-10-CM

## 2013-10-12 LAB — POCT URINALYSIS DIPSTICK
Bilirubin, UA: NEGATIVE
Blood, UA: NEGATIVE
Glucose, UA: NEGATIVE
Ketones, UA: NEGATIVE
Leukocytes, UA: NEGATIVE
Nitrite, UA: NEGATIVE
Spec Grav, UA: 1.015
Urobilinogen, UA: 0.2
pH, UA: 5.5

## 2013-10-12 NOTE — Progress Notes (Signed)
   Subjective:    Patient ID: Micheal Graham, male    DOB: 01-24-1956, 59 y.o.   MRN: [id number]  HPI Patient seen for the following issues  Recent DOT physical. He had apparently some trace protein on dipstick and was referred here. He has hypertension which has been well controlled. No history of any renal disorder. No recent peripheral edema.  Patient noted incidentally couple weeks ago what appears to be subungual hematoma underneath the base of the nail left fourth finger. Does not recall any trauma. No pain. He has appointment with dermatologist in 2 weeks.  No recent appetite or weight changes.  Past Medical History  Diagnosis Date  . 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 Allergy   . Hypertension   . Chronic kidney disease   . Migraines   . Colon polyps    Past Surgical History  Procedure Laterality Date  . Appendectomy  1974    reports that he quit smoking about 14 years ago. His smoking use included Cigarettes. He has a 60 pack-year smoking history. He does not have any smokeless tobacco history on file. He reports that he does not drink alcohol or use illicit drugs. family history includes Cancer in his maternal uncle; Heart disease in his father; Hypertension in his mother; Stroke in his paternal grandfather. No Known Allergies    Review of Systems  Constitutional: Negative for fever, chills, appetite change, fatigue and unexpected weight change.  Eyes: Negative for visual disturbance.  Respiratory: Negative for cough, chest tightness and shortness of breath.   Cardiovascular: Negative for chest pain, palpitations and leg swelling.  Neurological: Negative for dizziness, syncope, weakness, light-headedness and headaches.       Objective:   Physical Exam  Constitutional: He is oriented to person, place, and time. He appears well-developed and well-nourished.  HENT:  Right Ear: External ear normal.  Left Ear: External ear normal.  Mouth/Throat: Oropharynx is clear and  moist.  Eyes: Pupils are equal, round, and reactive to light.  Neck: Neck supple. No thyromegaly present.  Cardiovascular: Normal rate and regular rhythm.   Pulmonary/Chest: Effort normal and breath sounds normal. No respiratory distress. He has no wheezes. He has no rales.  Musculoskeletal: He exhibits no edema.  Neurological: He is alert and oriented to person, place, and time.  Skin:  Left fourth finger reveals what appears to be small subungual hematoma at the base of the nail and extending underneath the nail matrix region.          Assessment & Plan:  #1 proteinuria. 1+ protein on dipstick. We explained this is very nonspecific and very unlikely to be related to any significant pathology. He does not have any uncontrolled blood pressure, history of diabetes, peripheral edema, etc. to suggest significant pathology. We explained this is very nonspecific and recommended adequate hydration. No further evaluation this time #2 probable subungual hematoma left fourth digit. He has followup with dermatologist in 2 weeks. Suspect this does represent subungual hematoma and not melanoma. Evaluate as above

## 2013-10-12 NOTE — Progress Notes (Signed)
Pre visit review using our clinic review tool, if applicable. No additional management support is needed unless otherwise documented below in the visit note. 

## 2013-10-12 NOTE — Patient Instructions (Signed)
Proteinuria Proteinuria is a condition in which urine contains more protein than is normal. Proteinuria is either a sign that your body is producing too much protein or a sign that there is a problem with the kidneys. Healthy kidneys prevent most substances that the body needs, including proteins, from leaving the bloodstream and ending up in urine. CAUSES  Proteinuria may be caused by a temporary event or condition such as stress, exercise, or fever, and go away on its own. Proteinuria may also be a symptom of a more serious condition or disease. Causes of proteinuria include:  A kidney disease caused by:  Diabetes.  High blood pressure (hypertension).   A disease that affects the immune system, such as lupus.  A genetic disease, such as Alport's syndrome.  Medicines that damage the kidneys, such as long-term nonsteroidal anti-inflammatory drugs (NSAIDs).  Poisoning or exposure to toxic substances.  A reoccurring kidney or urinary infection.  Excess protein production in the body caused by:  Multiple myeloma.  Amyloidosis. SYMPTOMS You may have proteinuria without having noticeable symptoms. If there is a large amount of protein in your urine, your urine may look foamy. You may also notice swelling (edema) in your hands, feet, abdomen, or face. DIAGNOSIS To determine whether you have proteinuria, you will need to provide a urine sample. Your urine will then be tested for too much protein and the main blood protein albumin. If your test shows that you have proteinuria, you may need to take additional tests to determine its cause, how much protein is in your urine, and what type of protein is being lost. Tests may include:  Blood tests.  Urine tests.  A blood pressure measurement.  Imaging tests. TREATMENT  Treatment will depend on the cause of your proteinuria. Your caregiver will discuss treatment options with you after you have been diagnosed. If your proteinuria is mild or  temporary, no treatment may be necessary. HOME CARE INSTRUCTIONS Ask your caregiver if monitoring the level of protein in your urine at home using simple testing strips is appropriate for you. Early detection of proteinuria can lead to early and often successful treatment of the condition causing it. Document Released: 05/13/2005 Document Revised: 12/16/2011 Document Reviewed: 08/21/2011 [employer] Patient Information 2015 [employer]. This information is not intended to replace advice given to you by your health care provider. Make sure you discuss any questions you have with your health care provider.  

## 2013-11-29 ENCOUNTER — Ambulatory Visit (INDEPENDENT_AMBULATORY_CARE_PROVIDER_SITE_OTHER): Payer: BC Managed Care – PPO | Admitting: Family Medicine

## 2013-11-29 ENCOUNTER — Encounter: Payer: Self-pay | Admitting: Family Medicine

## 2013-11-29 VITALS — BP 130/72 | HR 60 | Temp 97.7°F | Wt 231.0 lb

## 2013-11-29 DIAGNOSIS — R202 Paresthesia of skin: Secondary | ICD-10-CM

## 2013-11-29 DIAGNOSIS — R209 Unspecified disturbances of skin sensation: Secondary | ICD-10-CM

## 2013-11-29 LAB — TSH: TSH: 0.66 u[IU]/mL (ref 0.35–4.50)

## 2013-11-29 LAB — BASIC METABOLIC PANEL
BUN: 11 mg/dL (ref 6–23)
CO2: 25 mEq/L (ref 19–32)
Calcium: 8.1 mg/dL — ABNORMAL LOW (ref 8.4–10.5)
Chloride: 104 mEq/L (ref 96–112)
Creatinine, Ser: 1.4 mg/dL (ref 0.4–1.5)
GFR: 56.83 mL/min — ABNORMAL LOW (ref >60.00)
Glucose, Bld: 104 mg/dL — ABNORMAL HIGH (ref 70–99)
Potassium: 3.7 mEq/L (ref 3.5–5.1)
Sodium: 135 mEq/L (ref 135–145)

## 2013-11-29 LAB — VITAMIN B12: Vitamin B-12: 263 pg/mL (ref 211–911)

## 2013-11-29 MED ORDER — CLONAZEPAM 0.5 MG PO TABS: 0.5000 mg | ORAL_TABLET | Freq: Every evening | ORAL | Status: DC | PRN

## 2013-11-29 NOTE — Patient Instructions (Signed)
Paresthesia Paresthesia is an abnormal burning or prickling sensation. This sensation is generally felt in the hands, arms, legs, or feet. However, it may occur in any part of the body. It is usually not painful. The feeling may be described as:  Tingling or numbness.  "Pins and needles."  Skin crawling.  Buzzing.  Limbs "falling asleep."  Itching. Most people experience temporary (transient) paresthesia at some time in their lives. CAUSES  Paresthesia may occur when you breathe too quickly (hyperventilation). It can also occur without any apparent cause. Commonly, paresthesia occurs when pressure is placed on a nerve. The feeling quickly goes away once the pressure is removed. For some people, however, paresthesia is a long-lasting (chronic) condition caused by an underlying disorder. The underlying disorder may be:  A traumatic, direct injury to nerves. Examples include a:  Broken (fractured) neck.  Fractured skull.  A disorder affecting the brain and spinal cord (central nervous system). Examples include:  Transverse myelitis.  Encephalitis.  Transient ischemic attack.  Multiple sclerosis.  Stroke.  Tumor or blood vessel problems, such as an arteriovenous malformation pressing against the brain or spinal cord.  A condition that damages the peripheral nerves (peripheral neuropathy). Peripheral nerves are not part of the brain and spinal cord. These conditions include:  Diabetes.  Peripheral vascular disease.  Nerve entrapment syndromes, such as carpal tunnel syndrome.  Shingles.  Hypothyroidism.  Vitamin B12 deficiencies.  Alcoholism.  Heavy metal poisoning (lead, arsenic).  Rheumatoid arthritis.  Systemic lupus erythematosus. DIAGNOSIS  Your caregiver will attempt to find the underlying cause of your paresthesia. Your caregiver may:  Take your medical history.  Perform a physical exam.  Order various lab tests.  Order imaging tests. TREATMENT   Treatment for paresthesia depends on the underlying cause. HOME CARE INSTRUCTIONS  Avoid drinking alcohol.  You may consider massage or acupuncture to help relieve your symptoms.  Keep all follow-up appointments as directed by your caregiver. SEEK IMMEDIATE MEDICAL CARE IF:   You feel weak.  You have trouble walking or moving.  You have problems with speech or vision.  You feel confused.  You cannot control your bladder or bowel movements.  You feel numbness after an injury.  You faint.  Your burning or prickling feeling gets worse when walking.  You have pain, cramps, or dizziness.  You develop a rash. MAKE SURE YOU:  Understand these instructions.  Will watch your condition.  Will get help right away if you are not doing well or get worse. Document Released: 03/13/2002 Document Revised: 06/15/2011 Document Reviewed: 12/12/2010 [facility] Patient Information 2015 [place], [place]. This information is not intended to replace advice given to you by your health care provider. Make sure you discuss any questions you have with your health care provider.

## 2013-11-29 NOTE — Progress Notes (Signed)
Pre visit review using our clinic review tool, if applicable. No additional management support is needed unless otherwise documented below in the visit note. 

## 2013-11-29 NOTE — Progress Notes (Signed)
   Subjective:    Patient ID: Micheal Graham, male    DOB: 01/25/1956, 58 y.o.   MRN: [id number]  Foot Pain Associated symptoms include numbness. Pertinent negatives include no chest pain, chills, coughing, fever or weakness.   Patient seen for evaluation Pierce lesions of the feet. Onset about one month ago left foot and 2 weeks ago right foot. He has some partial numbness to tingling sensation. All toes of left foot involved and second through fifth toes of right foot. No clear exacerbating factors. No recent change of shoes. His symptoms are continuous. No alleviating factors. Denies any back pain. No leg or thigh numbness. No loss of bladder or bowel control. No history of diabetes. He has hypertension which is been well controlled. No specific risk factors for B12 deficiency.  Past Medical History  Diagnosis Date  . 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 Allergy   . Hypertension   . Chronic kidney disease   . Migraines   . Colon polyps    Past Surgical History  Procedure Laterality Date  . Appendectomy  1974    reports that he quit smoking about 14 years ago. His smoking use included Cigarettes. He has a 60 pack-year smoking history. He does not have any smokeless tobacco history on file. He reports that he does not drink alcohol or use illicit drugs. family history includes Cancer in his maternal uncle; Heart disease in his father; Hypertension in his mother; Stroke in his paternal grandfather. No Known Allergies    Review of Systems  Constitutional: Negative for fever, chills, appetite change and unexpected weight change.  Respiratory: Negative for cough and shortness of breath.   Cardiovascular: Negative for chest pain.  Endocrine: Negative for polydipsia and polyuria.  Neurological: Positive for numbness. Negative for weakness.  Hematological: Negative for adenopathy.       Objective:   Physical Exam  Constitutional: He appears well-developed and well-nourished. No distress.    Neck: Neck supple. No thyromegaly present.  Cardiovascular: Normal rate and regular rhythm.   Pulmonary/Chest: Effort normal and breath sounds normal. No respiratory distress. He has no wheezes. He has no rales.  Musculoskeletal: He exhibits no edema.  Lymphadenopathy:    He has no cervical adenopathy.  Neurological:  Full-strength lower extremities. Symmetric reflexes. He has subjective minimal impairment to touch toes bilaterally.  Skin: No rash noted.          Assessment & Plan:  Paresthesias involving distal feet and toes bilaterally. No history of diabetes. Doubt peripheral nerve compression. No evidence clinically for lumbar involvement. Check blood sugar, TSH, B12. If all normal, consider neurology referral

## 2013-12-01 ENCOUNTER — Telehealth: Payer: Self-pay | Admitting: Family Medicine

## 2013-12-01 NOTE — Telephone Encounter (Signed)
Pt informed lab results.

## 2013-12-01 NOTE — Telephone Encounter (Signed)
Pt called to ask for his test results

## 2013-12-12 ENCOUNTER — Encounter: Payer: Self-pay | Admitting: Family Medicine

## 2013-12-13 ENCOUNTER — Other Ambulatory Visit: Payer: Self-pay | Admitting: Family Medicine

## 2013-12-13 DIAGNOSIS — R251 Tremor, unspecified: Secondary | ICD-10-CM

## 2014-01-23 ENCOUNTER — Ambulatory Visit (INDEPENDENT_AMBULATORY_CARE_PROVIDER_SITE_OTHER): Payer: BC Managed Care – PPO | Admitting: Neurology

## 2014-01-23 ENCOUNTER — Encounter: Payer: Self-pay | Admitting: Neurology

## 2014-01-23 VITALS — BP 145/88 | HR 67 | Temp 98.1°F | Resp 14 | Ht 73.0 in | Wt 226.0 lb

## 2014-01-23 DIAGNOSIS — R449 Unspecified symptoms and signs involving general sensations and perceptions: Secondary | ICD-10-CM

## 2014-01-23 DIAGNOSIS — R270 Ataxia, unspecified: Secondary | ICD-10-CM | POA: Insufficient documentation

## 2014-01-23 DIAGNOSIS — R4189 Other symptoms and signs involving cognitive functions and awareness: Principal | ICD-10-CM

## 2014-01-23 DIAGNOSIS — R278 Other lack of coordination: Secondary | ICD-10-CM

## 2014-01-23 DIAGNOSIS — R208 Other disturbances of skin sensation: Secondary | ICD-10-CM

## 2014-01-23 DIAGNOSIS — G3184 Mild cognitive impairment, so stated: Secondary | ICD-10-CM

## 2014-01-23 HISTORY — DX: Unspecified symptoms and signs involving general sensations and perceptions: R44.9

## 2014-01-23 HISTORY — DX: Mild cognitive impairment of uncertain or unknown etiology: G31.84

## 2014-01-23 NOTE — Progress Notes (Signed)
[facility]   Provider:  Larey Seat, M D  Referring Provider: [name], MD Primary Care Physician:  [name], MD  Chief Complaint  Patient presents with  . NP sleep    Rm 11, Wife    HPI:  Micheal Graham is a 58 y.o. male , who is seen here as a referral from Dr. 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 There was no vaccine given and no new medication given at that time. He had started klonopin for sleep a while earlier. He now reports a feeling as" if a pencil is scratching the bottom of his feet. He felt another sensory abnormality on the right about a month later, not to the intensity of the left- more  of a sandpaper scratching feeling, not numb but asleep. He feels sometimes as if something in his feet.  The week of labour day his right hand started shaking. He had 2 more spells of this. One last week. He has a sedentary life style. No  He had a normal MRI of the brain and some Vit B12 injection almost 8 years ago. At that time he dropped objects. That's still the case, he used the term "clumsy "/ .  He now has word finding and naming problems, that are new.  He will replace words ( sweet with smell) .  He sleeps about 5-6 hours at night. He is not restored or refreshed in the morning , but most on top  of his game at 2-3 PM.   I added Dr. Bernardo Heater notes form years ago ; he reviewed an MRI of the neck spine and several lab tests. Only B12 deficiency was noted.   Review of Systems: Out of a complete 14 system review, the patient complains of only the following symptoms, and all other reviewed systems are negative. Memory, word-finding problems. Clumsy, sensory loss.    Epworth score 4  , Fatigue severity score 47 , depression score not obtained.    History    Social History  . Marital Status: Single    Spouse Name: [name]    Number of Children: [name]  . Years of Education: [name]   Occupational History  . Not on file.   Social History Main Topics  . Smoking status: Former Smoker -- 2.00 packs/day for 30 years    Types: Cigarettes    Quit date: 09/09/1999  . Smokeless tobacco: Not on file  . Alcohol Use: No     Comment: occasional beer  . Drug Use: No  . Sexual Activity: Not on file   Other Topics Concern  . Not on file   Social History Narrative   Right handed, caffeine 2 cups daily, FT -truck driver, Married, 2 kids, HS graduate    Family History  Problem Relation Age of Onset  . Hypertension Mother   . Heart disease Father   . Cancer Maternal Uncle     prostate  . Stroke Paternal Grandfather     Past Medical History  Diagnosis Date  . 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 Allergy   . Hypertension   . Chronic kidney disease   . Migraines   . Colon polyps     Past Surgical History  Procedure  Laterality Date  . Appendectomy  1974  . Abdominal hernia repair  2012  . Left knee  2011    surgery    Current Outpatient Prescriptions  Medication Sig Dispense Refill  . amLODipine-benazepril (LOTREL) 5-20 MG per capsule Take 1 capsule by mouth daily.  90 capsule  3  . diphenhydramine-acetaminophen (TYLENOL PM) 25-500 MG TABS Take 2 tablets by mouth at bedtime.      . fexofenadine (ALLEGRA) 180 MG tablet Take 180 mg by mouth daily.      . metoprolol succinate (TOPROL-XL) 100 MG 24 hr tablet Take 50 mg by mouth at bedtime. Take ONE TABLET DAILY      . sertraline (ZOLOFT) 100 MG tablet Take 50 mg by mouth daily. One half tablet daily       No current facility-administered medications for this visit.    Allergies as of 01/23/2014  . (No Known Allergies)    Vitals: BP 145/88  Pulse 67  Temp(Src) 98.1 F (36.7 C) (Oral)  Resp 14  Ht 6' 1"  (1.854 m)  Wt 226 lb (102.513 kg)  BMI 29.82 kg/m2 Last Weight:  Wt Readings from  Last 1 Encounters:  01/23/14 226 lb (102.513 kg)       Last Height:   Ht Readings from Last 1 Encounters:  01/23/14 6' 1"  (1.854 m)    Physical exam:  General: The patient is awake, alert and appears not in acute distress. The patient is well groomed. Head: Normocephalic, atraumatic. Neck is supple. Mallampati 3,  neck circumference: 17.5  Nasal airflow unrestricted , TMJ is evident. Retrognathia is seen.  Cardiovascular:  Regular rate and rhythm , without  murmurs or carotid bruit, and without distended neck veins. Respiratory: Lungs are clear to auscultation. Skin:  Without evidence of edema, or rash Trunk: BMI is  elevated and patient  has normal posture.  Neurologic exam : The patient is awake and alert, oriented to place and time.   Memory subjective  described as intact. There is a normal attention span & concentration ability.  Speech is fluent without  dysarthria, dysphonia or aphasia. Mood and affect are appropriate.  Cranial nerves: Pupils are equal and briskly reactive to light. Funduscopic exam without  evidence of pallor or edema. Extraocular movements  in vertical and horizontal planes intact and without nystagmus. Visual fields by finger perimetry are intact. Hearing to finger rub intact.  Facial sensation intact to fine touch. Facial motor strength is symmetric and tongue and uvula move midline.  Motor exam: Normal tone, muscle bulk and symmetric, strength in all extremities.  Sensory:  Fine touch, pinprick and vibration were tested in all extremities.   He has a reduced vibratory and fine filament  sensory in the left foot, Varicose veins .  Proprioception is tested in the upper extremities only. This was  normal.  Coordination: Rapid alternating movements in the fingers/hands is normal. Finger-to-nose maneuver dysmetria on the right, the left side normal without evidence of ataxia, dysmetria or tremor.  Gait and station: Patient walks without assistive device and  is able unassisted to climb up to the exam table. Strength within normal limits. Stance is stable and normal. Tandem gait is abnormal, ataxic.  Romberg testing is  negative.  Deep tendon reflexes: in the upper and lower extremities are symmetric and intact. Babinski maneuver response is downgoing.   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  2) decline in cognitive abilities, gradual.  3) right hand dysmetria- this is the hand That he reported to "tremble " .   The patient was advised of the nature of the diagnosed sleep disorder , the treatment options and risks for general a health and wellness arising from not treating the condition.  Visit duration was 45 minutes.   Plan:  Treatment plan and additional workup : 1) NCS and EMG, right wrist and left lower extremity.  2) Labs for neuropathy. Hba1c and TSH, Vit D and Vit B12 - he had pernicious anemia.  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      Asencion Partridge Maximina Pirozzi MD  01/23/2014

## 2014-01-23 NOTE — Patient Instructions (Signed)

## 2014-01-25 LAB — METHYLMALONIC ACID, SERUM: Methylmalonic Acid: 393 nmol/L — ABNORMAL HIGH (ref 0–378)

## 2014-01-25 LAB — COMPREHENSIVE METABOLIC PANEL
ALT: 16 IU/L (ref 0–44)
AST: 17 IU/L (ref 0–40)
Albumin/Globulin Ratio: 1.4 (ref 1.1–2.5)
Albumin: 4.4 g/dL (ref 3.6–4.8)
Alkaline Phosphatase: 145 IU/L — ABNORMAL HIGH (ref 39–117)
BUN/Creatinine Ratio: 10 (ref 10–22)
BUN: 14 mg/dL (ref 8–27)
CO2: 21 mmol/L (ref 18–29)
Calcium: 8.8 mg/dL (ref 8.6–10.2)
Chloride: 99 mmol/L (ref 97–108)
Creatinine, Ser: 1.4 mg/dL — ABNORMAL HIGH (ref 0.76–1.27)
GFR calc Af Amer: 63 mL/min/{1.73_m2} (ref >59)
GFR calc non Af Amer: 54 mL/min/{1.73_m2} — ABNORMAL LOW (ref >59)
Globulin, Total: 3.2 g/dL (ref 1.5–4.5)
Glucose: 97 mg/dL (ref 65–99)
Potassium: 4.4 mmol/L (ref 3.5–5.2)
Sodium: 138 mmol/L (ref 134–144)
Total Bilirubin: 0.2 mg/dL (ref 0.0–1.2)
Total Protein: 7.6 g/dL (ref 6.0–8.5)

## 2014-01-25 LAB — CBC WITH DIFFERENTIAL/PLATELET
Basophils Absolute: 0 10*3/uL (ref 0.0–0.2)
Basos: 0 %
Eos: 3 %
Eosinophils Absolute: 0.2 10*3/uL (ref 0.0–0.4)
HCT: 39.2 % (ref 37.5–51.0)
Hemoglobin: 13.5 g/dL (ref 12.6–17.7)
Immature Grans (Abs): 0 10*3/uL (ref 0.0–0.1)
Immature Granulocytes: 0 %
Lymphocytes Absolute: 1.8 10*3/uL (ref 0.7–3.1)
Lymphs: 25 %
MCH: 29.9 pg (ref 26.6–33.0)
MCHC: 34.4 g/dL (ref 31.5–35.7)
MCV: 87 fL (ref 79–97)
Monocytes Absolute: 0.6 10*3/uL (ref 0.1–0.9)
Monocytes: 8 %
Neutrophils Absolute: 4.5 10*3/uL (ref 1.4–7.0)
Neutrophils Relative %: 64 %
RBC: 4.51 x10E6/uL (ref 4.14–5.80)
RDW: 15.1 % (ref 12.3–15.4)
WBC: 7.1 10*3/uL (ref 3.4–10.8)

## 2014-01-25 LAB — TSH: TSH: 2.03 u[IU]/mL (ref 0.450–4.500)

## 2014-01-31 ENCOUNTER — Ambulatory Visit (INDEPENDENT_AMBULATORY_CARE_PROVIDER_SITE_OTHER): Payer: BC Managed Care – PPO | Admitting: Neurology

## 2014-01-31 ENCOUNTER — Encounter (INDEPENDENT_AMBULATORY_CARE_PROVIDER_SITE_OTHER): Payer: Self-pay

## 2014-01-31 DIAGNOSIS — Z0289 Encounter for other administrative examinations: Secondary | ICD-10-CM

## 2014-01-31 DIAGNOSIS — R208 Other disturbances of skin sensation: Secondary | ICD-10-CM

## 2014-01-31 DIAGNOSIS — R449 Unspecified symptoms and signs involving general sensations and perceptions: Secondary | ICD-10-CM

## 2014-01-31 DIAGNOSIS — R278 Other lack of coordination: Secondary | ICD-10-CM

## 2014-01-31 DIAGNOSIS — R4189 Other symptoms and signs involving cognitive functions and awareness: Secondary | ICD-10-CM

## 2014-01-31 DIAGNOSIS — R270 Ataxia, unspecified: Secondary | ICD-10-CM

## 2014-01-31 NOTE — Progress Notes (Signed)
  [facility]    Provider:  Dr Jaynee Eagles Referring Provider: [name], MD Primary Care Physician:  [name], MD  HPI:  Micheal Graham is a 58 y.o. male here for evaluation of right hand weakness and sensory changes in the feet. He reports right hand clumsiness; he is dropping objects and can't touch nose with hand. Feels like he has a wadded up sock on the bottom of his feet. Denies numbness, burning or tingling in the fingers. Hand symptoms are not worse with use and not waking him up at night. Has tremors in the right arm/hand for 3-4 seconds, episodic. Endorses neck and low back pain but denies radicular symptoms.  Summary  Nerve conduction studies were performed on the right upper and bilateral lower extremities:   Right Median motor nerve showed prolonged distal onset latency(4.55m, N<4) with normal F Wave latency.  Right Ulnar motor nerve showed normal conductions with normal F Wave latency Bilateral Peroneal and Tibial motor nerves were within normal limits with normal F Wave latencies  Right second-digit Median sensory nerve showed prolonged distal peak latency (4.4, N<3.9)   Right fifth-digit Ulnar sensory nerve was within normal limits  Bilateral Sural sensory nerves were within normal limits   Bilateral H Reflexes showed borderline latencies (337m N<32)  EMG Needle study was performed on selected right upper and bilateral lower extremity muscles:   The right Abductor Hallucis showed increased spontaneous activity, increased motor unit amplitude, prolonged motor unit duration and reduced motor unit recruitment. The left Abductor Hallucis showed increased spontaneous activity, increased motor unit amplitude and decreased motor unit recruitment.  The following muscles were normal bilaterally: Vastus Medialis, Anterior Tibialis, Medial Gastrocnemius, Extensor Hallucis Longus. The following right upper extremity muscles were normal: Right deltoid,  right Triceps, right Pronator Teres, right Flexor Digitorum Profundus (ulner heads), right Opponens Pollicis and right First Dorsal Interosseous.  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 There is also concomitant right median compression at the wrist. Clinical Correlation recommended.   AnSarina IllMD  [facility] [address] [id number] [phone]

## 2014-02-01 ENCOUNTER — Other Ambulatory Visit: Payer: Self-pay | Admitting: Diagnostic Neuroimaging

## 2014-02-01 DIAGNOSIS — R270 Ataxia, unspecified: Secondary | ICD-10-CM

## 2014-02-01 DIAGNOSIS — R449 Unspecified symptoms and signs involving general sensations and perceptions: Secondary | ICD-10-CM

## 2014-02-01 DIAGNOSIS — G3184 Mild cognitive impairment, so stated: Secondary | ICD-10-CM

## 2014-02-01 DIAGNOSIS — R278 Other lack of coordination: Secondary | ICD-10-CM

## 2014-02-01 DIAGNOSIS — R4189 Other symptoms and signs involving cognitive functions and awareness: Principal | ICD-10-CM

## 2014-02-06 NOTE — Progress Notes (Signed)
Quick Note:  Results given to pt. He had additional questions about kidney function. He will address when in for 02-14-14 appt with Dr. Brett Fairy. He verbalized understanding. ______

## 2014-02-14 ENCOUNTER — Ambulatory Visit (INDEPENDENT_AMBULATORY_CARE_PROVIDER_SITE_OTHER): Payer: BC Managed Care – PPO | Admitting: Neurology

## 2014-02-14 ENCOUNTER — Encounter: Payer: Self-pay | Admitting: Neurology

## 2014-02-14 VITALS — BP 147/91 | HR 63 | Temp 97.5°F | Resp 14 | Ht 70.25 in | Wt 226.0 lb

## 2014-02-14 DIAGNOSIS — G609 Hereditary and idiopathic neuropathy, unspecified: Secondary | ICD-10-CM

## 2014-02-14 DIAGNOSIS — G5601 Carpal tunnel syndrome, right upper limb: Secondary | ICD-10-CM

## 2014-02-14 MED ORDER — B COMPLEX PO TABS: 1.0000 | ORAL_TABLET | Freq: Every day | ORAL | Status: DC

## 2014-02-14 MED ORDER — ASPIRIN 81 MG PO CHEW: 81.0000 mg | CHEWABLE_TABLET | ORAL | Status: DC

## 2014-02-14 NOTE — Patient Instructions (Addendum)

## 2014-02-14 NOTE — Progress Notes (Signed)
[facility]   Provider:  Larey Seat, M D  Referring Provider: [name], MD Primary Care Physician:  [name], MD  Chief Complaint  Patient presents with  . RV bil numbness in feet    Rm 10, wife.    HPI:  Micheal Graham is a 58 y.o. male , who is seen here as a referral from Dr. 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 There was no vaccine given and no new medication given at that time. He had started klonopin for sleep a while earlier. He now reports a feeling as" if a pencil is scratching the bottom of his feet. He felt another sensory abnormality on the right about a month later, not to the intensity of the left- more  of a sandpaper scratching feeling, not numb but asleep. He feels sometimes as if something in his feet.  The week of labour day his right hand started shaking. He had 2 more spells of this. One last week. He has a sedentary life style. No  He had a normal MRI of the brain and some Vit B12 injection almost 8 years ago. At that time he dropped objects. That's still the case, he used the term "clumsy "/ .  He now has word finding and naming problems, that are new.  He will replace words ( sweet with smell) .  He sleeps about 5-6 hours at night. He is not restored or refreshed in the morning , but most on top  of his game at 2-3 PM.   I added Dr. Bernardo Heater notes form years ago ; he reviewed an MRI of the neck spine and several lab tests. Only B12 deficiency was noted.    02-14-14 carpal tunnel :  The right Abductor Hallucis showed increased spontaneous activity, increased motor unit amplitude, prolonged motor unit duration and reduced motor unit recruitment. The left Abductor Hallucis showed increased spontaneous activity, increased motor unit amplitude and  decreased motor unit recruitment. The following muscles were normal bilaterally: Vastus Medialis, Anterior Tibialis, Medial Gastrocnemius, Extensor Hallucis Longus. The following right upper extremity muscles were normal: Right deltoid, right Triceps, right Pronator Teres, right Flexor Digitorum Profundus (ulner heads), right Opponens Pollicis and right First Dorsal Interosseous.  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 There is also concomitant right median compression at the wrist. Clinical Correlation recommended.   Micheal Ill, MD   Review of Systems: Out of a complete 14 system review, the patient complains of only the following symptoms, and all other reviewed systems are negative. Memory, word-finding problems. Clumsy, sensory loss.  Neuropathy and carpal tunnel on the right - dropping objects.    Epworth score 4  , Fatigue severity score 47 , depression score not obtained.    History   Social History  . Marital Status: Single    Spouse Name: [name]    Number of Children: [name]  . Years of Education: [name]   Occupational History  . Not on file.   Social History Main Topics  . Smoking status: Former Smoker -- 2.00 packs/day for 30 years    Types: Cigarettes    Quit date: 09/09/1999  . Smokeless tobacco: Not on file  .  Alcohol Use: No     Comment: occasional beer  . Drug Use: No  . Sexual Activity: Not on file   Other Topics Concern  . Not on file   Social History Narrative   Right handed, caffeine 2 cups daily, FT -truck driver, Married, 2 kids, HS graduate    Family History  Problem Relation Age of Onset  . Hypertension Mother   . Heart disease Father   . Cancer Maternal Uncle     prostate  . Stroke Paternal Grandfather     Past Medical History  Diagnosis Date  . 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 Allergy   . Hypertension   . Chronic kidney disease    . Migraines   . Colon polyps   . Cognitive impairment, mild, so stated 01/23/2014  . 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 Appendectomy  1974  . Abdominal hernia repair  2012  . Left knee  2011    surgery    Current Outpatient Prescriptions  Medication Sig Dispense Refill  . amLODipine-benazepril (LOTREL) 5-20 MG per capsule Take 1 capsule by mouth daily. 90 capsule 3  . diphenhydramine-acetaminophen (TYLENOL PM) 25-500 MG TABS Take 2 tablets by mouth at bedtime.    . fexofenadine (ALLEGRA) 180 MG tablet Take 180 mg by mouth daily.    . metoprolol succinate (TOPROL-XL) 100 MG 24 hr tablet Take 50 mg by mouth at bedtime. Take ONE TABLET DAILY    . sertraline (ZOLOFT) 100 MG tablet Take 50 mg by mouth daily. One half tablet daily     No current facility-administered medications for this visit.    Allergies as of 02/14/2014  . (No Known Allergies)    Vitals: BP 147/91 mmHg  Pulse 63  Temp(Src) 97.5 F (36.4 C) (Oral)  Resp 14  Ht 5' 10.25" (1.784 m)  Wt 226 lb (102.513 kg)  BMI 32.21 kg/m2 Last Weight:  Wt Readings from Last 1 Encounters:  02/14/14 226 lb (102.513 kg)       Last Height:   Ht Readings from Last 1 Encounters:  02/14/14 5' 10.25" (1.784 m)    Physical exam:  General: The patient is awake, alert and appears not in acute distress. The patient is well groomed. Head: Normocephalic, atraumatic. Neck is supple. Mallampati 3,  neck circumference: 17.5  Nasal airflow unrestricted , TMJ is evident. Retrognathia is seen.  Cardiovascular:  Regular rate and rhythm , without  murmurs or carotid bruit, and without distended neck veins. Respiratory: Lungs are clear to auscultation. Skin:  Without evidence of edema, or rash Trunk: BMI is  elevated and patient  has normal posture.  Neurologic exam : The patient is awake and alert, oriented to place and time.   Memory subjective  described as intact. There is a  normal attention span & concentration ability.  Speech is fluent without  dysarthria, dysphonia or aphasia. Mood and affect are appropriate.  Cranial nerves: Pupils are equal and briskly reactive to light. Funduscopic exam without  evidence of pallor or edema. Extraocular movements  in vertical and horizontal planes intact and without nystagmus. Visual fields by finger perimetry are intact. Hearing to finger rub intact.  Facial sensation intact to fine touch. Facial motor strength is symmetric and tongue and uvula move midline.  Motor exam: Normal tone, muscle bulk and symmetric, strength in all extremities.  Sensory:  Fine touch, pinprick and vibration were tested in all  extremities.   He has a reduced vibratory and fine filament  sensory in the left foot, Varicose veins .  Proprioception is tested in the upper extremities only. This was  normal.  Coordination: Rapid alternating movements in the fingers/hands is normal. Finger-to-nose maneuver dysmetria on the right, the left side normal without evidence of ataxia, dysmetria or tremor.  Gait and station: Patient walks without assistive device and is able unassisted to climb up to the exam table. Strength within normal limits. Stance is stable and normal. Tandem gait is abnormal, ataxic.  Romberg testing is  negative.  Deep tendon reflexes: in the upper and lower extremities are symmetric and intact. Babinski maneuver response is downgoing.   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 He has elevated creatinine and alk phos.   Vit B complex, hydrate and  Wear sensible shoes and socks.  2) decline in cognitive abilities, gradual.  MR brain reviewed, small vessel disease. No stroke.  3) right hand dysmetria- this is the hand That he  reported to "tremble " . Carpal tunnel explains this.    The patient was advised of the nature of the diagnosed  disorder ,  Visit duration was 30 minutes.   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  Wife reports snoring , he feels unrested , needs SPLIT , sleeps only 5 hours.      Asencion Partridge Len Kluver MD  02/14/2014

## 2014-03-20 ENCOUNTER — Other Ambulatory Visit: Payer: Self-pay | Admitting: *Deleted

## 2014-03-20 ENCOUNTER — Ambulatory Visit (INDEPENDENT_AMBULATORY_CARE_PROVIDER_SITE_OTHER): Payer: BC Managed Care – PPO | Admitting: Family Medicine

## 2014-03-20 ENCOUNTER — Encounter: Payer: Self-pay | Admitting: Family Medicine

## 2014-03-20 VITALS — BP 138/78 | HR 62 | Temp 97.7°F | Wt 225.0 lb

## 2014-03-20 DIAGNOSIS — J0191 Acute recurrent sinusitis, unspecified: Secondary | ICD-10-CM

## 2014-03-20 DIAGNOSIS — F418 Other specified anxiety disorders: Secondary | ICD-10-CM

## 2014-03-20 MED ORDER — ALPRAZOLAM 0.5 MG PO TABS: 0.5000 mg | ORAL_TABLET | Freq: Three times a day (TID) | ORAL | Status: DC | PRN

## 2014-03-20 MED ORDER — MOMETASONE FUROATE 0.1 % EX SOLN: Freq: Every day | CUTANEOUS | Status: DC

## 2014-03-20 MED ORDER — HYDROCODONE-HOMATROPINE 5-1.5 MG/5ML PO SYRP: 5.0000 mL | ORAL_SOLUTION | Freq: Four times a day (QID) | ORAL | Status: AC | PRN

## 2014-03-20 MED ORDER — AMOXICILLIN 875 MG PO TABS: 875.0000 mg | ORAL_TABLET | Freq: Two times a day (BID) | ORAL | Status: DC

## 2014-03-20 NOTE — Progress Notes (Signed)
Pre visit review using our clinic review tool, if applicable. No additional management support is needed unless otherwise documented below in the visit note. 

## 2014-03-20 NOTE — Progress Notes (Signed)
   Subjective:    Patient ID: Micheal Graham, male    DOB: February 18, 1956, 58 y.o.   MRN: [id number]  HPI Patient seen with sinus pressure, postnasal drip, sneezing, coughing, malaise, bodyaches over the past week. No definite fever. Occasional chills. No nausea or vomiting. No sick contacts.  Upcoming DOT physical. He has severe white coat syndrome. He states he has taken low-dose alprazolam the past secondary to white coat syndrome. He is requesting 3 tablets to take 1-1 hour prior to that exam. He is also use to take preflight for severe anxiety.  Past Medical History  Diagnosis Date  . 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 Allergy   . Hypertension   . Chronic kidney disease   . Migraines   . Colon polyps   . Cognitive impairment, mild, so stated 01/23/2014  . 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 Appendectomy  1974  . Abdominal hernia repair  2012  . Left knee  2011    surgery    reports that he quit smoking about 14 years ago. His smoking use included Cigarettes. He has a 60 pack-year smoking history. He does not have any smokeless tobacco history on file. He reports that he does not drink alcohol or use illicit drugs. family history includes Cancer in his maternal uncle; Heart disease in his father; Hypertension in his mother; Stroke in his paternal grandfather. No Known Allergies    Review of Systems  Constitutional: Positive for chills and fatigue. Negative for fever.  HENT: Positive for congestion.   Respiratory: Positive for cough. Negative for shortness of breath and wheezing.   Neurological: Positive for headaches.       Objective:   Physical Exam  Constitutional: He appears well-developed and well-nourished.  HENT:  Right Ear: External ear normal.  Left Ear: External ear normal.  Mouth/Throat: Oropharynx is clear and moist.  Neck: Neck supple.  Cardiovascular: Normal rate and regular rhythm.   Pulmonary/Chest: Effort  normal and breath sounds normal. No respiratory distress. He has no wheezes. He has no rales.  Lymphadenopathy:    He has no cervical adenopathy.          Assessment & Plan:  #1 acute upper respiratory infection with cough. We explained this is likely viral. Nonfocal exam. Would recommend observation. Hycodan cough syrup 1 teaspoon daily at bedtime for severe cough. We've not recommended antibiotics but would only start amoxicillin 875 mg twice a day for 10 days if he develops any progressive symptoms or persistent symptoms #2 situational anxiety. Xanax 0.5 mg 1 tablet 1 hour prior to flying #3 tablets with no refill

## 2014-03-20 NOTE — Patient Instructions (Signed)

## 2014-03-21 ENCOUNTER — Telehealth: Payer: Self-pay

## 2014-03-21 NOTE — Telephone Encounter (Signed)
Mometasone 0.1%  Only comes in as a cream, ointment, and solution. Which one would you like me to change it to.

## 2014-03-21 NOTE — Telephone Encounter (Signed)
Solution.

## 2014-03-22 ENCOUNTER — Telehealth: Payer: Self-pay

## 2014-03-22 MED ORDER — BENZONATATE 200 MG PO CAPS: 200.0000 mg | ORAL_CAPSULE | Freq: Three times a day (TID) | ORAL | Status: DC | PRN

## 2014-03-22 NOTE — Telephone Encounter (Signed)
Called in solution for the patient.

## 2014-03-22 NOTE — Telephone Encounter (Signed)
[facility] Night - Client [place] Call Center Patient Name: Micheal Graham Gender: Male DOB: 1956-02-14 Age: 58 Y 2 M 8 D Return Phone Number: [phone] (Primary), [phone] (Secondary) Address: [address]. City/State/Zip: [place] [address] Client [facility] Night - Client Client Site [facility] - Night Physician Carolann Littler Contact Type Call Call Type Triage / [place] Name [facility] Relationship To Patient Spouse Return Phone Number [phone] (Primary) Chief Complaint Cough Initial Comment Caller says that her husband has been coughing non stop, and the drainage is making him gag, but no vomiting. And has a sore throat PreDisposition Call Doctor Nurse Assessment Nurse: Glennon Mac, RN, Amber Date/Time Eilene Ghazi Time): 03/19/2014 10:33:08 PM Confirm and document reason for call. If symptomatic, describe symptoms. ---Caller states her husband has been experiencing symptoms since Sunday morning. Symptoms consist of coughing, drainage, sneezing. No fever noted. Stated he felt rattling in his chest. Drainage is making him nauseated. Has the patient traveled out of the country within the last 30 days? ---No Does the patient require triage? ---Yes Related visit to physician within the last 2 weeks? ---No Does the PT have any chronic conditions? (i.e. diabetes, asthma, etc.) ---No Guidelines Guideline Title Affirmed Question Affirmed Notes Nurse Date/Time (Eastern Time) Common Cold [1] SEVERE sore throat AND [2] present > 24 hours Glennon Mac, RN, [employer] 03/19/2014 10:37:30 PM Disp. Time Eilene Ghazi Time) Disposition Final User 03/19/2014 10:40:55 PM See Physician within 24 Hours Yes Glennon Mac, Therapist, sports, Agricultural consultant Understands: Yes Disagree/Comply: Comply PLEASE NOTE: All timestamps contained within this report are represented as [place] Standard  Time. CONFIDENTIALTY NOTICE: This fax transmission is intended only for the addressee. It contains information that is legally privileged, confidential or otherwise protected from use or disclosure. If you are not the intended recipient, you are strictly prohibited from reviewing, disclosing, copying using or disseminating any of this information or taking any action in reliance on or regarding this information. If you have received this fax in error, please notify us immediately by telephone so that we can arrange for its return to [place]. Phone: [phone], Toll-Free: [phone], Fax: [phone] Page: 2 of 2 Call Id: [id number] Care Advice Given Per Guideline SEE PHYSICIAN WITHIN 24 HOURS: * IF OFFICE WILL BE OPEN: You need to be examined within the next 24 hours. Call your doctor when the office opens, and make an appointment. SORE THROAT - For relief of sore throat: * Sip warm chicken broth or apple juice. * Suck on hard candy or a throat lozenge (OTC). * Gargle with warm salt water four times a day. To make salt water, put 1/2 teaspoon of salt in 8 oz (240 ml) of warm water. PAIN MEDICINES: * For pain relief, take acetaminophen, ibuprofen, or naproxen. * Use the lowest amount that makes your pain feel better. FOR A STUFFY NOSE - USE NASAL WASHES: * Introduction: Saline (salt water) nasal irrigation (nasal wash) is an effective and simple home remedy for treating stuffy nose and sinus congestion. The nose can be irrigated by pouring, spraying, or squirting salt water into the nose and then letting it run back out. * STEP 1: Lean over a sink. * STEP 2: Gently squirt or spray warm salt water into one of your nostrils. * STEP 3: Some of the water may run into the back of your throat. Spit this out. If you swallow the salt water it will not hurt you. * STEP 4: Blow  your nose to clean out the water and mucus. * STEP 5: Repeat steps 1-4 for the other nostril. You can do this a couple times a day if it  seems to help you. CALL BACK IF: * Fever over 104 F * You have difficulty breathing * You become worse. HUMIDIFIER: If the air in your home is dry, use a humidifier. CARE ADVICE given per Colds (Adult) guideline. After Care Instructions Given Call Event Type User Date / Time Description Referrals REFERRED TO PCP OFFICE

## 2014-03-22 NOTE — Telephone Encounter (Signed)
Pt informed. Per Dr. B to send in tessalon 250m one tablet every 8 hours prn for cough.

## 2014-03-26 ENCOUNTER — Ambulatory Visit: Payer: Self-pay

## 2014-03-26 ENCOUNTER — Other Ambulatory Visit: Payer: Self-pay | Admitting: Occupational Medicine

## 2014-03-26 DIAGNOSIS — R52 Pain, unspecified: Secondary | ICD-10-CM

## 2014-04-02 ENCOUNTER — Telehealth: Payer: Self-pay | Admitting: *Deleted

## 2014-04-02 NOTE — Telephone Encounter (Signed)
I do not feel that short-term (couple of weeks) use of diclofenac would be a significant interaction

## 2014-04-02 NOTE — Telephone Encounter (Signed)
[facility] Night - Client [place] Call Center Patient Name: Micheal Graham Gender: Male DOB: 07-17-1955 Age: 58 Y 2 M 21 D Return Phone Number: [phone] (Primary), [phone] (Secondary) Address: [address] City/State/Zip: [place] [address] Client [facility] Night - Client Client Site [facility] - Night Physician Carolann Littler Contact Type Call Call Type Triage / Clinical Relationship To Patient Self Return Phone Number [phone] (Primary) Chief Complaint Medication Question (non symptomatic) Initial Comment Caller states that he broke some ribs last week, went to doctor, given anti inflammatory, wants to see if it will react with his other medication. PreDisposition Home Care Nurse Assessment Nurse: Markus Daft, RN, Sherre Poot Date/Time Eilene Ghazi Time): 04/01/2014 7:08:40 PM Confirm and document reason for call. If symptomatic, describe symptoms. ---Caller states that he broke some ribs last week, went to employer's doctor as it is WC case. He was given anti inflammatory, Diclofenac NA 75 mg #30 and he's not needed it until today. He coughed and felt something "pop". And now pain with coughing, and almost any pain. He wants to see if it will react with his other medication - Toprol XL, Amlodipine/ benazepril, Allegra. -- RN advised per [employer] Drug interaction checker that significant interactions and should verify with MD. Caller verb. understanding. diclofenac + metoprolol diclofenac decreases effects of metoprolol by pharmacodynamic antagonism. Significant interaction possible, monitor closely. Long term (>1 wk) NSAID use. NSAIDs decrease prostaglandin synthesis. benazepril + diclofenac Interaction warnings: 'benazepril, diclofenac. Either increases toxicity of the other by Other (see comment). Significant interaction possible, monitor closely. Comment: May result  in renal function deterioration, particularly in elderly or volume depleted individuals. metoprolol + amlodipine metoprolol and amlodipine both increase anti-hypertensive channel blocking. Potential for dangerous interaction. Use with caution and monitor closely. diclofenac + benazepril diclofenac decreases effects of benazepril by pharmacodynamic antagonism. Potential for dangerous interaction. Use with caution and monitor closely. Potential for dangerous interaction. Use with caution and monitor closely. NSAIDs decrease synthesis of vasodilating renal prostaglandins, and thus affect fluid homeostasis and may diminish antihypertensive effect. metoprolol + diclofenac metoprolol and diclofenac both increase serum potassium. Potential for interaction, monitor." Has the patient traveled out of the country within the last 30 days? ---Not Applicable PLEASE NOTE: All timestamps contained within this report are represented as [place] Standard Time. CONFIDENTIALTY NOTICE: This fax transmission is intended only for the addressee. It contains information that is legally privileged, confidential or otherwise protected from use or disclosure. If you are not the intended recipient, you are strictly prohibited from reviewing, disclosing, copying using or disseminating any of this information or taking any action in reliance on or regarding this information. If you have received this fax in error, please notify us immediately by telephone so that we can arrange for its return to [place]. Phone: [phone], Toll-Free: [phone], Fax: [phone] Page: 2 of 2 Call Id: [id number] Nurse Assessment Does the patient require triage? ---Yes Related visit to physician within the last 2 weeks? ---Yes Does the PT have any chronic conditions? (i.e. diabetes, asthma, etc.) ---Yes List chronic conditions. ---HTN Guidelines Guideline Title Affirmed Question Affirmed Notes Nurse Date/Time Eilene Ghazi Time) Chest  Injury - Bending Lifting or Twisting [1] High-risk adult (e.g., age > 84, osteoporosis, chronic steroid use) AND [2] still hurts Markus Daft, RN, [facility] 04/01/2014 7:15:23 PM Disp. Time Eilene Ghazi Time) Disposition Final User 04/01/2014 7:16:53 PM See Physician within 24 Hours Yes Markus Daft, RN, Sherre Poot Caller Understands: Yes Disagree/Comply: Comply Care Advice  Given Per Guideline SEE PHYSICIAN WITHIN 24 HOURS: * IF OFFICE WILL BE OPEN: You need to be examined within the next 24 hours. Call your doctor when the office opens, and make an appointment. PAIN MEDICINES: * For pain relief, take acetaminophen. * Before taking any medicine, read all the instructions on the package. CALL BACK IF: * Severe pain persists over 2 hours after pain medicine and ice * Difficulty breathing occurs * You become worse. CARE ADVICE given per Chest Injury - Bending, Lifting, or Twisting (Adult) guideline. After Care Instructions Given Call Event Type User Date / Time Description Comments User: Mayford Knife, RN Date/Time Eilene Ghazi Time): 04/01/2014 7:19:22 PM RN advised that pt call his work to see if what they would recommend on whether he should go to their doctor again. Caller verb. understanding. Referrals REFERRED TO PCP OFFICE

## 2014-04-02 NOTE — Telephone Encounter (Signed)
Pt informed

## 2014-04-10 ENCOUNTER — Other Ambulatory Visit: Payer: Self-pay | Admitting: Family Medicine

## 2014-05-07 ENCOUNTER — Encounter: Payer: Self-pay | Admitting: Neurology

## 2014-05-28 ENCOUNTER — Ambulatory Visit (INDEPENDENT_AMBULATORY_CARE_PROVIDER_SITE_OTHER): Payer: BLUE CROSS/BLUE SHIELD | Admitting: Family Medicine

## 2014-05-28 ENCOUNTER — Encounter: Payer: Self-pay | Admitting: Family Medicine

## 2014-05-28 VITALS — BP 134/80 | HR 64 | Temp 97.7°F | Wt 228.0 lb

## 2014-05-28 DIAGNOSIS — J0110 Acute frontal sinusitis, unspecified: Secondary | ICD-10-CM

## 2014-05-28 MED ORDER — METOPROLOL SUCCINATE ER 50 MG PO TB24
50.0000 mg | ORAL_TABLET | Freq: Every day | ORAL | Status: DC
Start: 2014-05-28 — End: 2015-05-26

## 2014-05-28 MED ORDER — AMOXICILLIN 875 MG PO TABS: 875.0000 mg | ORAL_TABLET | Freq: Two times a day (BID) | ORAL | Status: DC

## 2014-05-28 NOTE — Progress Notes (Signed)
Pre visit review using our clinic review tool, if applicable. No additional management support is needed unless otherwise documented below in the visit note. 

## 2014-05-28 NOTE — Patient Instructions (Signed)

## 2014-05-28 NOTE — Progress Notes (Signed)
   Subjective:    Patient ID: Micheal Graham, male    DOB: 08-01-1955, 59 y.o.   MRN: [id number]  HPI Acute visit. Patient here with congestion mostly frontal sinuses for about 3 weeks now. Some postnasal drip. He has frequent pressure in the right ear. No fever. Frequent headaches. Some colored nasal discharge intermittently. Occasional cough. Increased malaise  Patient takes Toprol 100 mg XL and has been cutting these in half. Requesting change of prescription to 50 mg. He has sometimes difficulty cutting these in half.  Past Medical History  Diagnosis Date  . 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 Allergy   . Hypertension   . Chronic kidney disease   . Migraines   . Colon polyps   . Cognitive impairment, mild, so stated 01/23/2014  . 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 Appendectomy  1974  . Abdominal hernia repair  2012  . Left knee  2011    surgery    reports that he quit smoking about 14 years ago. His smoking use included Cigarettes. He has a 60 pack-year smoking history. He does not have any smokeless tobacco history on file. He reports that he does not drink alcohol or use illicit drugs. family history includes Cancer in his maternal uncle; Heart disease in his father; Hypertension in his mother; Stroke in his paternal grandfather. No Known Allergies    Review of Systems  Constitutional: Negative for fever, chills and appetite change.  HENT: Positive for congestion, postnasal drip and sinus pressure.   Respiratory: Positive for cough.   Neurological: Positive for headaches.       Objective:   Physical Exam  Constitutional: He appears well-developed and well-nourished.  HENT:  Head: Normocephalic and atraumatic.  Right Ear: External ear normal.  Left Ear: External ear normal.  Mouth/Throat: Oropharynx is clear and moist.  Neck: Neck supple. No thyromegaly present.  Cardiovascular: Normal rate and regular rhythm.     Pulmonary/Chest: Effort normal and breath sounds normal. No respiratory distress. He has no wheezes. He has no rales.          Assessment & Plan:  Acute sinusitis. Given duration of symptoms amoxicillin 875 mrem twice daily for 10 days.

## 2014-07-06 ENCOUNTER — Encounter: Payer: Self-pay | Admitting: Family Medicine

## 2014-07-09 ENCOUNTER — Other Ambulatory Visit: Payer: Self-pay | Admitting: Family Medicine

## 2014-07-09 DIAGNOSIS — R29818 Other symptoms and signs involving the nervous system: Principal | ICD-10-CM

## 2014-07-09 DIAGNOSIS — R449 Unspecified symptoms and signs involving general sensations and perceptions: Secondary | ICD-10-CM

## 2014-07-17 ENCOUNTER — Telehealth: Payer: Self-pay | Admitting: Family Medicine

## 2014-07-17 NOTE — Telephone Encounter (Signed)
PER VASCULAR AND VEIN  Wanted to know why this patient need to be seen . Need to be more  Specific than  Sensory deficit present what is  The doctor asking for pt to have done . Please advise

## 2014-07-19 NOTE — Telephone Encounter (Signed)
We do not expect a vascular problem will explain his sensory issues.  He has previously been worked up per neurology.  Pt states he had some type of "varicose vein surgery" in his 45s.  HE (the patient) is requesting evaluation.  The patient has been dogmatic in requesting referral.

## 2014-07-31 ENCOUNTER — Other Ambulatory Visit: Payer: Self-pay | Admitting: Family Medicine

## 2014-08-02 ENCOUNTER — Telehealth: Payer: Self-pay | Admitting: Neurology

## 2014-08-10 ENCOUNTER — Telehealth: Payer: Self-pay

## 2014-08-10 NOTE — Telephone Encounter (Signed)
Patient has a doctor's apt on Monday and wife wants to pick up NCV/EMG by 11:00 am .

## 2014-08-21 ENCOUNTER — Encounter: Payer: Self-pay | Admitting: Vascular Surgery

## 2014-08-23 ENCOUNTER — Encounter: Payer: Self-pay | Admitting: Vascular Surgery

## 2014-08-24 ENCOUNTER — Ambulatory Visit (HOSPITAL_COMMUNITY)
Admission: RE | Admit: 2014-08-24 | Discharge: 2014-08-24 | Disposition: A | Discharge status: Home / Self Care | Payer: BLUE CROSS/BLUE SHIELD | Source: Ambulatory Visit | Attending: Vascular Surgery | Admitting: Vascular Surgery

## 2014-08-24 ENCOUNTER — Encounter: Payer: Self-pay | Admitting: Vascular Surgery

## 2014-08-24 ENCOUNTER — Ambulatory Visit (INDEPENDENT_AMBULATORY_CARE_PROVIDER_SITE_OTHER): Payer: BLUE CROSS/BLUE SHIELD | Admitting: Vascular Surgery

## 2014-08-24 ENCOUNTER — Other Ambulatory Visit: Payer: Self-pay | Admitting: *Deleted

## 2014-08-24 VITALS — BP 121/70 | HR 77 | Resp 16 | Ht 72.0 in | Wt 228.0 lb

## 2014-08-24 DIAGNOSIS — M79604 Pain in right leg: Secondary | ICD-10-CM | POA: Insufficient documentation

## 2014-08-24 DIAGNOSIS — M79605 Pain in left leg: Secondary | ICD-10-CM | POA: Diagnosis not present

## 2014-08-24 DIAGNOSIS — G609 Hereditary and idiopathic neuropathy, unspecified: Secondary | ICD-10-CM | POA: Diagnosis not present

## 2014-08-24 NOTE — Progress Notes (Signed)
Patient name: Micheal Graham  MRN: [id number] DOB: May 19, 1955 Sex: male   Referred by: Elease Hashimoto  Reason for referral:  Chief Complaint  Patient presents with  . New Evaluation    bilateral foot pain,numbness and right hip pain    HISTORY OF PRESENT ILLNESS: The patient presents today to evaluate lower extremity neurologic changes to rule out arterial pathology. He is very pleasant 59 year old gentleman who reports that over the last 8-10 months he's had the numbness in his feet. He reports his again initially in his left toes and then extended up towards the ball of his foot. It then began affecting his right foot similarly as well. He also reports occasional shooting pain to his left hip knee and into his calf. He reports that this is not painful in his feet. He reports that is irritated and annoying. He has no sensory or motor dysfunction. Denies any prior history of stroke does not have any cardiac difficulty. Is not diabetic.  Past Medical History  Diagnosis Date  . 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 Allergy   . Hypertension   . Chronic kidney disease   . Migraines   . Colon polyps   . Cognitive impairment, mild, so stated 01/23/2014  . 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 Appendectomy  1974  . Abdominal hernia repair  2012  . Left knee  2011    surgery  . Varicose vein surgery      History   Social History  . Marital Status: Single    Spouse Name: [name]  . Number of Children: [name]  . Years of Education: [name]   Occupational History  . Not on file.   Social History Main Topics  . Smoking status: Former Smoker -- 2.00 packs/day for 30 years    Types: Cigarettes    Quit date: 09/09/1999  . Smokeless tobacco: Never Used  . Alcohol Use: No     Comment: occasional beer  . Drug Use: No  . Sexual Activity: Not on file   Other Topics Concern  . Not on file   Social History Narrative   Right handed, caffeine 2 cups  daily, FT -truck driver, Married, 2 kids, HS graduate    Family History  Problem Relation Age of Onset  . Hypertension Mother   . Heart disease Father   . Cancer Maternal Uncle     prostate  . Stroke Paternal Grandfather     Allergies as of 08/24/2014  . (No Known Allergies)    Current Outpatient Prescriptions on File Prior to Visit  Medication Sig Dispense Refill  . ALPRAZolam (XANAX) 0.5 MG tablet Take 1 tablet (0.5 mg total) by mouth 3 (three) times daily as needed for anxiety. 3 tablet 0  . amLODipine-benazepril (LOTREL) 5-20 MG per capsule TAKE 1 CAPSULE BY MOUTH DAILY. 90 capsule 3  . amoxicillin (AMOXIL) 875 MG tablet Take 1 tablet (875 mg total) by mouth 2 (two) times daily. 20 tablet 0  . b complex vitamins tablet Take 1 tablet by mouth daily. 30 tablet 11  . diphenhydramine-acetaminophen (TYLENOL PM) 25-500 MG TABS Take 2 tablets by mouth at bedtime.    . fexofenadine (ALLEGRA) 180 MG tablet Take 180 mg by mouth daily.    . metoprolol succinate (TOPROL-XL) 50 MG 24 hr tablet Take 1 tablet (50 mg total) by mouth daily. Take with or immediately following a meal. 90 tablet 3  .  sertraline (ZOLOFT) 100 MG tablet TAKE 1 TABLET BY MOUTH EVERY DAY 90 tablet 2  . aspirin (ASPIRIN CHILDRENS) 81 MG chewable tablet Chew 1 tablet (81 mg total) by mouth every other day. (Patient not taking: Reported on 08/24/2014) 30 tablet 1  . mometasone (ELOCON) 0.1 % lotion Apply topically daily. (Patient not taking: Reported on 08/24/2014) 60 mL 1   No current facility-administered medications on file prior to visit.     REVIEW OF SYSTEMS:  Positives indicated with an "X"  CARDIOVASCULAR:  [ ]  chest pain   [ ]  chest pressure   [ ]  palpitations   [ ]  orthopnea   [ ]  dyspnea on exertion   [ ]  claudication   [ ]  rest pain   [ ]  DVT   [ ]  phlebitis PULMONARY:   [ ]  productive cough   [ ]  asthma   [ ]  wheezing NEUROLOGIC:   [ x] weakness  [ ]  paresthesias  [ ]  aphasia  [ ]  amaurosis  [x ]  dizziness HEMATOLOGIC:   [ ]  bleeding problems   [ ]  clotting disorders MUSCULOSKELETAL:  [ ]  joint pain   [ ]  joint swelling GASTROINTESTINAL: [ ]   blood in stool  [ ]   hematemesis GENITOURINARY:  [ ]   dysuria  [ ]   hematuria PSYCHIATRIC:  [ ]  history of major depression INTEGUMENTARY:  [ ]  rashes  [ ]  ulcers CONSTITUTIONAL:  [ ]  fever   [ ]  chills  PHYSICAL EXAMINATION:  General: The patient is a well-nourished male, in no acute distress. Vital signs are BP 121/70 mmHg  Pulse 77  Resp 16  Ht 6' (1.829 m)  Wt 228 lb (103.42 kg)  BMI 30.92 kg/m2 Pulmonary: There is a good air exchange bilaterally without wheezing or rales. Abdomen: Soft and non-tender with normal pitch bowel sounds. No bruits noted Musculoskeletal: There are no major deformities.  There is no significant extremity pain. Neurologic: No focal weakness or paresthesias are detected, Skin: There are no ulcer or rashes noted. Psychiatric: The patient has normal affect. Cardiovascular: There is a regular rate and rhythm without significant murmur appreciated. Pulse status: 2+ radial 2+ femoral and 2+ dorsalis pedis and posterior tibial bilaterally Carotid arteries without bruits bilaterally  VVS Vascular Lab Studies:  Ordered and Independently Reviewed her extremity studies revealed normal ankle arm index and normal triphasic waveforms in the posterior tibial and dorsalis pedis and digital waveforms bilaterally  Impression and Plan:  I discussed this with the patient. I explained that there is no evidence of arterial insufficiency to explain his sensory changes in his feet. He does have some telangiectasia most prominently in his left calf and ankle. Explained this does not go along with venous pathology as well. Explained that the this does appear to be a neuropathy of some type. He was reassured that this is not arterial continue his follow-up with Dr. Pearline Cables [facility]  [place] Office: [phone]

## 2014-09-05 NOTE — Telephone Encounter (Signed)
Error

## 2014-11-23 ENCOUNTER — Encounter: Payer: Self-pay | Admitting: Family Medicine

## 2014-11-23 ENCOUNTER — Ambulatory Visit (INDEPENDENT_AMBULATORY_CARE_PROVIDER_SITE_OTHER): Payer: BLUE CROSS/BLUE SHIELD | Admitting: Family Medicine

## 2014-11-23 VITALS — BP 136/80 | HR 68 | Temp 98.1°F | Wt 226.0 lb

## 2014-11-23 DIAGNOSIS — I10 Essential (primary) hypertension: Secondary | ICD-10-CM

## 2014-11-23 DIAGNOSIS — G549 Nerve root and plexus disorder, unspecified: Secondary | ICD-10-CM

## 2014-11-23 MED ORDER — ALPRAZOLAM 0.5 MG PO TABS: 0.5000 mg | ORAL_TABLET | Freq: Three times a day (TID) | ORAL | Status: DC | PRN

## 2014-11-23 NOTE — Progress Notes (Signed)
   Subjective:    Patient ID: Micheal Graham, male    DOB: 03-14-1956, 59 y.o.   MRN: [id number]  HPI  Patient seen for medical follow-up. He's had couple year history of sensory neuropathy involving his feet. This involves only the bottom of both feet and extends somewhat laterally. He has previously seen a neurologist. He had normal B12 and TSH levels. He takes B vitamin supplement. He's had previous nerve conduction velocities which were fairly nonspecific. He had ABIs and consult with vascular surgeon several months ago and that evaluation was normal. He has never noted any motor weakness. He does not have any total numbness but more dysesthesias involving the bottoms of both feet. No associated pain.  Hypertension which is been well controlled with metoprolol and Lotrel. No recent orthostasis. Nonsmoker. No high-risk for heavy metal exposure. He has not had any prior serum protein electrophoresis testing.  No history of diabetes.  Past Medical History  Diagnosis Date  . 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 Allergy   . Hypertension   . Chronic kidney disease   . Migraines   . Colon polyps   . Cognitive impairment, mild, so stated 01/23/2014  . 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 Appendectomy  1974  . Abdominal hernia repair  2012  . Left knee  2011    surgery  . Varicose vein surgery      reports that he quit smoking about 15 years ago. His smoking use included Cigarettes. He has a 60 pack-year smoking history. He has never used smokeless tobacco. He reports that he does not drink alcohol or use illicit drugs. family history includes Cancer in his maternal uncle; Heart disease in his father; Hypertension in his mother; Stroke in his paternal grandfather. No Known Allergies   Review of Systems  Constitutional: Negative for fever, chills, appetite change, fatigue and unexpected weight change.  Eyes: Negative for visual disturbance.    Respiratory: Negative for cough, chest tightness and shortness of breath.   Cardiovascular: Negative for chest pain, palpitations and leg swelling.  Gastrointestinal: Negative for abdominal pain.  Neurological: Positive for numbness. Negative for dizziness, syncope, weakness, light-headedness and headaches.       Objective:   Physical Exam  Constitutional: He is oriented to person, place, and time. He appears well-developed and well-nourished.  HENT:  Right Ear: External ear normal.  Left Ear: External ear normal.  Mouth/Throat: Oropharynx is clear and moist.  Eyes: Pupils are equal, round, and reactive to light.  Neck: Neck supple. No thyromegaly present.  Cardiovascular: Normal rate and regular rhythm.   Pulmonary/Chest: Effort normal and breath sounds normal. No respiratory distress. He has no wheezes. He has no rales.  Musculoskeletal: He exhibits no edema.  Neurological: He is alert and oriented to person, place, and time.  Intact sensation with monofilament testing. He has full strength throughout lower extremities. 2+ patellar reflexes bilaterally and trace ankle bilaterally          Assessment & Plan:  Bilateral sensory neuropathy involving both feet. We explained this is likely idiopathic. No history of diabetes. Previous workup as above. We recommended heavy metal screen and serum protein electropheresis for completeness. Continue B vitamin supplement.

## 2014-11-23 NOTE — Progress Notes (Signed)
Pre visit review using our clinic review tool, if applicable. No additional management support is needed unless otherwise documented below in the visit note. 

## 2014-11-27 ENCOUNTER — Telehealth: Payer: Self-pay | Admitting: Family Medicine

## 2014-11-27 LAB — PROTEIN ELECTROPHORESIS, SERUM
Albumin ELP: 2.5 g/dL — ABNORMAL LOW (ref 3.8–4.8)
Alpha-1-Globulin: 0.2 g/dL (ref 0.2–0.3)
Alpha-2-Globulin: 0.5 g/dL (ref 0.5–0.9)
Beta 2: 0.3 g/dL (ref 0.2–0.5)
Beta Globulin: 0.3 g/dL — ABNORMAL LOW (ref 0.4–0.6)
Gamma Globulin: 0.9 g/dL (ref 0.8–1.7)
Total Protein, Serum Electrophoresis: 4.6 g/dL — ABNORMAL LOW (ref 6.1–8.1)

## 2014-11-27 NOTE — Telephone Encounter (Addendum)
Pharm is calling their system is not recognizing dr Elease Hashimoto dea # I verify dr Elease Hashimoto dea # and pharm would like to put alprazolam rx under another provider

## 2014-11-28 NOTE — Telephone Encounter (Signed)
Spoke with pharmacist  

## 2014-11-29 ENCOUNTER — Ambulatory Visit (INDEPENDENT_AMBULATORY_CARE_PROVIDER_SITE_OTHER): Payer: BLUE CROSS/BLUE SHIELD | Admitting: Family Medicine

## 2014-11-29 ENCOUNTER — Encounter: Payer: Self-pay | Admitting: Family Medicine

## 2014-11-29 VITALS — BP 130/80 | HR 78 | Temp 97.8°F | Wt 222.0 lb

## 2014-11-29 DIAGNOSIS — G609 Hereditary and idiopathic neuropathy, unspecified: Secondary | ICD-10-CM

## 2014-11-29 NOTE — Progress Notes (Signed)
   Subjective:    Patient ID: Micheal Graham, male    DOB: Sep 17, 1955, 59 y.o.   MRN: [id number]  HPI  patient seen for follow-up regarding his sensory neuropathy involving the feet. Refer to prior note:  "Patient seen for medical follow-up. He's had couple year history of sensory neuropathy involving his feet. This involves only the bottom of both feet and extends somewhat laterally. He has previously seen a neurologist. He had normal B12 and TSH levels. He takes B vitamin supplement. He's had previous nerve conduction velocities which were fairly nonspecific. He had ABIs and consult with vascular surgeon several months ago and that evaluation was normal. He has never noted any motor weakness. He does not have any total numbness but more dysesthesias involving the bottoms of both feet. No associated pain."   He has already seen neurologist regarding this. We added heavy metal screen which is pending. Serum protein electrophoresis. Patient was concerned because he had noted that he had some proteins that were low but none of them are elevated and we explained this is nonspecific and not concerning  He denies any low back pain and no peripheral weakness. No risk factors for HIV.  Past Medical History  Diagnosis Date  . 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 Allergy   . Hypertension   . Chronic kidney disease   . Migraines   . Colon polyps   . Cognitive impairment, mild, so stated 01/23/2014  . 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 Appendectomy  1974  . Abdominal hernia repair  2012  . Left knee  2011    surgery  . Varicose vein surgery      reports that he quit smoking about 15 years ago. His smoking use included Cigarettes. He has a 60 pack-year smoking history. He has never used smokeless tobacco. He reports that he does not drink alcohol or use illicit drugs. family history includes Cancer in his maternal uncle; Heart disease in his father;  Hypertension in his mother; Stroke in his paternal grandfather. No Known Allergies     Review of Systems  Constitutional: Negative for appetite change and unexpected weight change.  Musculoskeletal: Negative for back pain.  Neurological: Negative for dizziness, weakness and headaches.  Hematological: Negative for adenopathy.       Objective:   Physical Exam  Constitutional: He appears well-developed and well-nourished. No distress.  Cardiovascular: Normal rate and regular rhythm.   Pulmonary/Chest: Effort normal and breath sounds normal. No respiratory distress. He has no wheezes. He has no rales.  Musculoskeletal: He exhibits no edema.  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 Urine heavy metal pending. SPEP unremarkable. We recommend observation at this time. We have offered to get back in to see neurology but at this point this is not causing any major problems. He has more dysesthesia than true numbness

## 2014-11-29 NOTE — Progress Notes (Signed)
Pre visit review using our clinic review tool, if applicable. No additional management support is needed unless otherwise documented below in the visit note. 

## 2014-11-30 ENCOUNTER — Telehealth: Payer: Self-pay

## 2014-11-30 NOTE — Telephone Encounter (Signed)
Pt informed

## 2014-11-30 NOTE — Telephone Encounter (Signed)
Lab called from [facility] and stated that are not able to do the Heavy Metal urine because of a 5 day process and the urine was collected on Monday and the next time they run the urine it will be on Saturday and that is not within the 5 days.

## 2014-11-30 NOTE — Telephone Encounter (Signed)
We were not notified of this initially.. Let pt know and I guess we will have to reorder-recollect.

## 2014-12-06 LAB — HEAVY METALS SCREEN, URINE
Arsenic, 24H Ur: 3 mcg/L (ref <81)
Lead, Urine (24 Hr): <1 mcg/L (ref <80)
Mercury 24 Hr Urine: <2 mcg/L (ref <21)

## 2014-12-31 ENCOUNTER — Encounter: Payer: Self-pay | Admitting: Family Medicine

## 2015-01-08 ENCOUNTER — Ambulatory Visit (INDEPENDENT_AMBULATORY_CARE_PROVIDER_SITE_OTHER): Payer: BLUE CROSS/BLUE SHIELD | Admitting: Family Medicine

## 2015-01-08 DIAGNOSIS — Z23 Encounter for immunization: Secondary | ICD-10-CM

## 2015-03-21 ENCOUNTER — Other Ambulatory Visit: Payer: Self-pay | Admitting: Family Medicine

## 2015-04-30 ENCOUNTER — Other Ambulatory Visit: Payer: Self-pay | Admitting: Family Medicine

## 2015-05-22 ENCOUNTER — Other Ambulatory Visit: Payer: Self-pay | Admitting: Family Medicine

## 2015-05-26 ENCOUNTER — Other Ambulatory Visit: Payer: Self-pay | Admitting: Family Medicine

## 2015-05-27 ENCOUNTER — Encounter: Payer: Self-pay | Admitting: Family Medicine

## 2015-05-27 ENCOUNTER — Other Ambulatory Visit: Payer: Self-pay | Admitting: Family Medicine

## 2015-06-12 ENCOUNTER — Ambulatory Visit (INDEPENDENT_AMBULATORY_CARE_PROVIDER_SITE_OTHER): Payer: BLUE CROSS/BLUE SHIELD | Admitting: Family Medicine

## 2015-06-12 ENCOUNTER — Encounter: Payer: Self-pay | Admitting: Family Medicine

## 2015-06-12 VITALS — BP 130/84 | HR 70 | Temp 98.1°F | Ht 71.75 in | Wt 226.4 lb

## 2015-06-12 DIAGNOSIS — Z0000 Encounter for general adult medical examination without abnormal findings: Secondary | ICD-10-CM

## 2015-06-12 LAB — HEPATIC FUNCTION PANEL
ALT: 20 U/L (ref 0–53)
AST: 15 U/L (ref 0–37)
Albumin: 4.2 g/dL (ref 3.5–5.2)
Alkaline Phosphatase: 105 U/L (ref 39–117)
Bilirubin, Direct: 0.1 mg/dL (ref 0.0–0.3)
Total Bilirubin: 0.4 mg/dL (ref 0.2–1.2)
Total Protein: 7.1 g/dL (ref 6.0–8.3)

## 2015-06-12 LAB — CBC WITH DIFFERENTIAL/PLATELET
Basophils Absolute: 0 10*3/uL (ref 0.0–0.1)
Basophils Relative: 0.2 % (ref 0.0–3.0)
Eosinophils Absolute: 0.1 10*3/uL (ref 0.0–0.7)
Eosinophils Relative: 1.4 % (ref 0.0–5.0)
HCT: 38.9 % — ABNORMAL LOW (ref 39.0–52.0)
Hemoglobin: 13.2 g/dL (ref 13.0–17.0)
Lymphocytes Relative: 23.8 % (ref 12.0–46.0)
Lymphs Abs: 2 10*3/uL (ref 0.7–4.0)
MCHC: 33.9 g/dL (ref 30.0–36.0)
MCV: 90.9 fl (ref 78.0–100.0)
Monocytes Absolute: 0.6 10*3/uL (ref 0.1–1.0)
Monocytes Relative: 7.1 % (ref 3.0–12.0)
Neutro Abs: 5.6 10*3/uL (ref 1.4–7.7)
Neutrophils Relative %: 67.5 % (ref 43.0–77.0)
Platelets: 168 10*3/uL (ref 150.0–400.0)
RBC: 4.28 Mil/uL (ref 4.22–5.81)
RDW: 17.1 % — ABNORMAL HIGH (ref 11.5–15.5)
WBC: 8.3 10*3/uL (ref 4.0–10.5)

## 2015-06-12 LAB — BASIC METABOLIC PANEL
BUN: 17 mg/dL (ref 6–23)
CO2: 24 mEq/L (ref 19–32)
Calcium: 9.1 mg/dL (ref 8.4–10.5)
Chloride: 105 mEq/L (ref 96–112)
Creatinine, Ser: 1.24 mg/dL (ref 0.40–1.50)
GFR: 62.9 mL/min (ref >60.00)
Glucose, Bld: 95 mg/dL (ref 70–99)
Potassium: 4.2 mEq/L (ref 3.5–5.1)
Sodium: 138 mEq/L (ref 135–145)

## 2015-06-12 LAB — LIPID PANEL
Cholesterol: 199 mg/dL (ref 0–200)
HDL: 34.1 mg/dL — ABNORMAL LOW (ref >39.00)
NonHDL: 164.61
Total CHOL/HDL Ratio: 6
Triglycerides: 271 mg/dL — ABNORMAL HIGH (ref 0.0–149.0)
VLDL: 54.2 mg/dL — ABNORMAL HIGH (ref 0.0–40.0)

## 2015-06-12 LAB — TSH: TSH: 1.3 u[IU]/mL (ref 0.35–4.50)

## 2015-06-12 LAB — PSA: PSA: 0.3 ng/mL (ref 0.10–4.00)

## 2015-06-12 LAB — LDL CHOLESTEROL, DIRECT: Direct LDL: 124 mg/dL

## 2015-06-12 NOTE — Progress Notes (Signed)
Pre visit review using our clinic review tool, if applicable. No additional management support is needed unless otherwise documented below in the visit note. 

## 2015-06-12 NOTE — Progress Notes (Signed)
Subjective:    Patient ID: Micheal Graham, male    DOB: [birth year], 60 y.o.   MRN: [id number]  HPI  Patient here for complete physical. His chronic problems include history of hypertension, kidney stones, idiopathic peripheral neuropathy, and basal cell skin cancer. He is followed regularly by dermatology. Colonoscopy up-to-date. No progression and neuropathy symptoms. Workup last year was unrevealing. He has hypertension which has been well controlled. He has history of some chronic anxiety which is stable on sertraline. Quit smoking back in 2001.  Past Medical History  Diagnosis Date  . 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 Allergy   . Hypertension   . Chronic kidney disease   . Migraines   . Colon polyps   . Cognitive impairment, mild, so stated 01/23/2014  . 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 Appendectomy  1974  . Abdominal hernia repair  2012  . Left knee  2011    surgery  . Varicose vein surgery      reports that he quit smoking about 15 years ago. His smoking use included Cigarettes. He has a 60 pack-year smoking history. He has never used smokeless tobacco. He reports that he does not drink alcohol or use illicit drugs. family history includes Cancer in his maternal uncle; Heart disease in his father; Hypertension in his mother; Stroke in his paternal grandfather. No Known Allergies    Review of Systems  Constitutional: Negative for fever, activity change, appetite change and fatigue.  HENT: Negative for congestion, ear pain and trouble swallowing.   Eyes: Negative for pain and visual disturbance.  Respiratory: Negative for cough, shortness of breath and wheezing.   Cardiovascular: Negative for chest pain and palpitations.  Gastrointestinal: Negative for nausea, vomiting, abdominal pain, diarrhea, constipation, blood in stool, abdominal distention and rectal pain.  Endocrine: Negative for polydipsia.  Genitourinary: Negative  for dysuria, hematuria and testicular pain.  Musculoskeletal: Negative for joint swelling and arthralgias.  Skin: Negative for rash.  Neurological: Negative for dizziness, syncope and headaches.  Hematological: Negative for adenopathy.  Psychiatric/Behavioral: Negative for confusion and dysphoric mood.       Objective:   Physical Exam  Constitutional: He is oriented to person, place, and time. He appears well-developed and well-nourished. No distress.  HENT:  Head: Normocephalic and atraumatic.  Right Ear: External ear normal.  Left Ear: External ear normal.  Mouth/Throat: Oropharynx is clear and moist.  Eyes: Conjunctivae and EOM are normal. Pupils are equal, round, and reactive to light.  Neck: Normal range of motion. Neck supple. No thyromegaly present.  Cardiovascular: Normal rate, regular rhythm and normal heart sounds.   No murmur heard. Pulmonary/Chest: Effort normal and breath sounds normal. No respiratory distress. He has no wheezes. He has no rales.  Abdominal: Soft. Bowel sounds are normal. He exhibits no distension and no mass. There is no tenderness. There is no rebound and no guarding.  Musculoskeletal: He exhibits no edema.  Lymphadenopathy:    He has no cervical adenopathy.  Neurological: He is alert and oriented to person, place, and time. He displays normal reflexes. No cranial nerve deficit.  Skin: No rash noted.  Psychiatric: He has a normal mood and affect.          Assessment & Plan:   Physical exam. Immunizations up-to-date. Colonoscopy up-to-date. Obtain screening lab works. We discussed pros and cons of PSA testing and he requests this as well. Continue regular blood pressure  medications. Continue close monitoring. He is encouraged try to lose some weight.

## 2015-08-25 ENCOUNTER — Other Ambulatory Visit: Payer: Self-pay | Admitting: Family Medicine

## 2015-08-26 ENCOUNTER — Encounter: Payer: Self-pay | Admitting: Family Medicine

## 2015-08-27 ENCOUNTER — Other Ambulatory Visit: Payer: Self-pay | Admitting: Family Medicine

## 2015-08-27 MED ORDER — SERTRALINE HCL 100 MG PO TABS: 100.0000 mg | ORAL_TABLET | Freq: Every day | ORAL | Status: DC

## 2015-08-27 MED ORDER — METOPROLOL SUCCINATE ER 50 MG PO TB24: ORAL_TABLET | ORAL | Status: DC

## 2016-01-22 ENCOUNTER — Ambulatory Visit (INDEPENDENT_AMBULATORY_CARE_PROVIDER_SITE_OTHER): Payer: BLUE CROSS/BLUE SHIELD | Admitting: Family Medicine

## 2016-01-22 ENCOUNTER — Encounter: Payer: Self-pay | Admitting: Family Medicine

## 2016-01-22 VITALS — BP 132/80 | HR 70 | Temp 97.9°F | Ht 71.75 in | Wt 232.5 lb

## 2016-01-22 DIAGNOSIS — Z23 Encounter for immunization: Secondary | ICD-10-CM

## 2016-01-22 DIAGNOSIS — M542 Cervicalgia: Secondary | ICD-10-CM | POA: Diagnosis not present

## 2016-01-22 DIAGNOSIS — I10 Essential (primary) hypertension: Secondary | ICD-10-CM

## 2016-01-22 DIAGNOSIS — F419 Anxiety disorder, unspecified: Secondary | ICD-10-CM | POA: Diagnosis not present

## 2016-01-22 MED ORDER — METHOCARBAMOL 500 MG PO TABS: 500.0000 mg | ORAL_TABLET | Freq: Three times a day (TID) | ORAL | 0 refills | Status: DC | PRN

## 2016-01-22 MED ORDER — SERTRALINE HCL 100 MG PO TABS: 100.0000 mg | ORAL_TABLET | Freq: Every day | ORAL | 3 refills | Status: DC

## 2016-01-22 MED ORDER — AMLODIPINE BESY-BENAZEPRIL HCL 5-20 MG PO CAPS: ORAL_CAPSULE | ORAL | 3 refills | Status: DC

## 2016-01-22 MED ORDER — PREDNISONE 10 MG PO TABS: ORAL_TABLET | ORAL | 0 refills | Status: DC

## 2016-01-22 NOTE — Progress Notes (Signed)
Pre visit review using our clinic review tool, if applicable. No additional management support is needed unless otherwise documented below in the visit note. 

## 2016-01-22 NOTE — Patient Instructions (Signed)
Follow up for any upper extremity numbness or weakness.

## 2016-01-22 NOTE — Progress Notes (Signed)
Subjective:     Patient ID: Micheal Graham, male   DOB: December 12, 1955, 60 y.o.   MRN: [id number]  HPI Patient seen today for the following issues  New concern of 2 month history of some left upper back and lower neck symptoms. He states he woke up with what he thought was a "crick in the neck ". Denies any injury. He noticed some tightness and pain which is somewhat intermittent and occasionally worse with lateral bending and rotation. He's also noted frequent tingling sensation from the base of the left neck toward the shoulder but never below the shoulder. Denies any upper extremity weakness. He does not any pain radiating down below the shoulder region and no pain with range of motion left shoulder. He had MRI cervical spine 2009 per neurology which showed foraminal stenosis on the left at multiple levels with degenerative disc disease diffusely. Pain is relatively mild.  Hypertension treated with benazepril-amlodipine combination pill along with metoprolol. Requesting refills of Lotrel. Blood pressures been well controlled by home readings. No headaches. No dizziness. No chest pains.  History of chronic anxiety which has been controlled well with sertraline 100 mg daily. Denies side effects. Requesting refills.  Past Medical History:  Diagnosis Date  . Allergy   . Cancer [facility]) 2013   right shin  . Chronic kidney disease   . Cognitive impairment, mild, so stated 01/23/2014  . Colon polyps   . 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 Hypertension   . Migraines    Past Surgical History:  Procedure Laterality Date  . ABDOMINAL HERNIA REPAIR  2012  . APPENDECTOMY  1974  . left knee  2011   surgery  . VARICOSE VEIN SURGERY      reports that he quit smoking about 16 years ago. His smoking use included Cigarettes. He has a 60.00 pack-year smoking history. He has never used smokeless tobacco. He reports that he does not drink alcohol or use drugs. family history includes Cancer in his maternal uncle;  Heart disease in his father; Hypertension in his mother; Stroke in his paternal grandfather. No Known Allergies   Review of Systems  Constitutional: Negative for chills, fatigue, fever and unexpected weight change.  Eyes: Negative for visual disturbance.  Respiratory: Negative for cough, chest tightness and shortness of breath.   Cardiovascular: Negative for chest pain, palpitations and leg swelling.  Musculoskeletal: Positive for neck pain and neck stiffness.  Neurological: Negative for dizziness, syncope, weakness, light-headedness and headaches.  Hematological: Negative for adenopathy.       Objective:   Physical Exam  Constitutional: He is oriented to person, place, and time. He appears well-developed and well-nourished.  HENT:  Right Ear: External ear normal.  Left Ear: External ear normal.  Mouth/Throat: Oropharynx is clear and moist.  Eyes: Pupils are equal, round, and reactive to light.  Neck: Neck supple. No thyromegaly present.  Cardiovascular: Normal rate and regular rhythm.   Pulmonary/Chest: Effort normal and breath sounds normal. No respiratory distress. He has no wheezes. He has no rales.  Musculoskeletal: He exhibits no edema.  No spinal tenderness. Full range of motion. He has some minimal left paracervical muscle tenderness  Neurological: He is alert and oriented to person, place, and time.       Assessment:     #1 left-sided neck pain and upper back pain. He has known history of cervical spondylosis and suspect he may have some cervical nerve root impingement. Nonfocal neuro exam  #2 hypertension stable.  #3  chronic anxiety. Stable on sertraline.    Plan:     -Try moist heat along with muscle massage -Robaxin 500 mg daily at bedtime when necessary -Prednisone taper over the next week -Touch base in 2 weeks if not improved and sooner for any of her extremity weakness or numbness -Refill sertraline and Lotrel for one year  [name] MD [place]  [facility] at [facility]

## 2016-02-24 ENCOUNTER — Ambulatory Visit (INDEPENDENT_AMBULATORY_CARE_PROVIDER_SITE_OTHER): Payer: BLUE CROSS/BLUE SHIELD | Admitting: Family Medicine

## 2016-02-24 ENCOUNTER — Encounter: Payer: Self-pay | Admitting: Family Medicine

## 2016-02-24 VITALS — BP 104/80 | HR 74 | Temp 97.7°F | Ht 71.75 in | Wt 229.0 lb

## 2016-02-24 DIAGNOSIS — R0982 Postnasal drip: Secondary | ICD-10-CM

## 2016-02-24 DIAGNOSIS — J309 Allergic rhinitis, unspecified: Secondary | ICD-10-CM | POA: Diagnosis not present

## 2016-02-24 DIAGNOSIS — R197 Diarrhea, unspecified: Secondary | ICD-10-CM | POA: Diagnosis not present

## 2016-02-24 MED ORDER — AZELASTINE HCL 0.1 % NA SOLN
1.0000 | Freq: Two times a day (BID) | NASAL | 12 refills | Status: DC
Start: 2016-02-24 — End: 2016-11-11

## 2016-02-24 NOTE — Patient Instructions (Signed)

## 2016-02-24 NOTE — Progress Notes (Signed)
Pre visit review using our clinic review tool, if applicable. No additional management support is needed unless otherwise documented below in the visit note. 

## 2016-02-24 NOTE — Progress Notes (Signed)
Subjective:     Patient ID: Micheal Graham, male   DOB: 06-16-1955, 60 y.o.   MRN: [id number]  HPI Patient seen with 3 day history of diarrhea. He states late last week he developed increased allergy symptoms with postnasal drip. Mostly clear nasal discharge. Over the weekend he developed increased nonbloody diarrhea which he thinks was related to postnasal drip. One episode of vomiting Friday and has had some slight nausea since then but no further episodes of vomiting. He tried to follow bland diet over the weekend but yesterday had some peanut butter which worsened his diarrhea again. No recent antibiotics. No recent travels. Already takes Allegra daily for allergy symptoms. No nasal sprays.  Past Medical History:  Diagnosis Date  . Allergy   . Cancer [facility]) 2013   right shin  . Chronic kidney disease   . Cognitive impairment, mild, so stated 01/23/2014  . Colon polyps   . 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 Hypertension   . Migraines    Past Surgical History:  Procedure Laterality Date  . ABDOMINAL HERNIA REPAIR  2012  . APPENDECTOMY  1974  . left knee  2011   surgery  . VARICOSE VEIN SURGERY      reports that he quit smoking about 16 years ago. His smoking use included Cigarettes. He has a 60.00 pack-year smoking history. He has never used smokeless tobacco. He reports that he does not drink alcohol or use drugs. family history includes Cancer in his maternal uncle; Heart disease in his father; Hypertension in his mother; Stroke in his paternal grandfather. No Known Allergies   Review of Systems  Constitutional: Negative for chills and fever.  HENT: Positive for congestion and postnasal drip. Negative for sinus pressure and sore throat.   Respiratory: Negative for shortness of breath.   Gastrointestinal: Positive for diarrhea. Negative for abdominal pain and blood in stool.       Objective:   Physical Exam  Constitutional: He appears well-developed and well-nourished.  HENT:   Mouth/Throat: Oropharynx is clear and moist.  Neck: Neck supple.  Cardiovascular: Normal rate and regular rhythm.   Pulmonary/Chest: Effort normal and breath sounds normal. No respiratory distress. He has no wheezes. He has no rales.  Abdominal: Soft. Bowel sounds are normal. He exhibits no distension and no mass. There is no tenderness. There is no rebound and no guarding.       Assessment:     Diarrhea possibly related to allergic postnasal drip symptoms. Does not appear dehydrated    Plan:     -Continue Allegra one daily -Add Astelin nasal spray 1-2 sprays per nostril twice daily -Discussed bland diet -Consider Imodium if diarrhea persist -Follow-up immediately for any fever, bloody stools, or other concerning symptoms  [name] MD [place] [facility] at [facility]

## 2016-04-08 ENCOUNTER — Ambulatory Visit (INDEPENDENT_AMBULATORY_CARE_PROVIDER_SITE_OTHER): Payer: BLUE CROSS/BLUE SHIELD | Admitting: Family Medicine

## 2016-04-08 ENCOUNTER — Encounter: Payer: Self-pay | Admitting: Family Medicine

## 2016-04-08 VITALS — BP 118/70 | HR 67 | Temp 97.9°F | Wt 231.3 lb

## 2016-04-08 DIAGNOSIS — I878 Other specified disorders of veins: Secondary | ICD-10-CM

## 2016-04-08 DIAGNOSIS — R600 Localized edema: Secondary | ICD-10-CM | POA: Diagnosis not present

## 2016-04-08 NOTE — Patient Instructions (Addendum)
Start knee high compression garments at 20-30 mmHg pressure and wear them daily. Elevate legs frequently. Venous Stasis or Chronic Venous Insufficiency Chronic venous insufficiency, also called venous stasis, is a condition that affects the veins in the legs. The condition prevents blood from being pumped through these veins effectively. Blood may no longer be pumped effectively from the legs back to the heart. This condition can range from mild to severe. With proper treatment, you should be able to continue with an active life. CAUSES  Chronic venous insufficiency occurs when the vein walls become stretched, weakened, or damaged or when valves within the vein are damaged. Some common causes of this include:  High blood pressure inside the veins (venous hypertension).  Increased blood pressure in the leg veins from long periods of sitting or standing.  A blood clot that blocks blood flow in a vein (deep vein thrombosis).  Inflammation of a superficial vein (phlebitis) that causes a blood clot to form. RISK FACTORS Various things can make you more likely to develop chronic venous insufficiency, including:  Family history of this condition.  Obesity.  Pregnancy.  Sedentary lifestyle.  Smoking.  Jobs requiring long periods of standing or sitting in one place.  Being a certain age. Women in their 6s and 3s and men in their 74s are more likely to develop this condition. SIGNS AND SYMPTOMS  Symptoms may include:   Varicose veins.  Skin breakdown or ulcers.  Reddened or discolored skin on the leg.  Brown, smooth, tight, and painful skin just above the ankle, usually on the inside surface (lipodermatosclerosis).  Swelling. DIAGNOSIS  To diagnose this condition, your health care provider will take a medical history and do a physical exam. The following tests may be ordered to confirm the diagnosis:  Duplex ultrasound-A procedure that produces a picture of a blood vessel and  nearby organs and also provides information on blood flow through the blood vessel.  Plethysmography-A procedure that tests blood flow.  A venogram, or venography-A procedure used to look at the veins using X-ray and dye. TREATMENT The goals of treatment are to help you return to an active life and to minimize pain or disability. Treatment will depend on the severity of the condition. Medical procedures may be needed for severe cases. Treatment options may include:   Use of compression stockings. These can help with symptoms and lower the chances of the problem getting worse, but they do not cure the problem.  Sclerotherapy-A procedure involving an injection of a material that "dissolves" the damaged veins. Other veins in the network of blood vessels take over the function of the damaged veins.  Surgery to remove the vein or cut off blood flow through the vein (vein stripping or laser ablation surgery).  Surgery to repair a valve. HOME CARE INSTRUCTIONS   Wear compression stockings as directed by your health care provider.  Only take over-the-counter or prescription medicines for pain, discomfort, or fever as directed by your health care provider.  Follow up with your health care provider as directed. SEEK MEDICAL CARE IF:   You have redness, swelling, or increasing pain in the affected area.  You see a red streak or line that extends up or down from the affected area.  You have a breakdown or loss of skin in the affected area, even if the breakdown is small.  You have an injury to the affected area. SEEK IMMEDIATE MEDICAL CARE IF:   You have an injury and open wound in the  affected area.  Your pain is severe and does not improve with medicine.  You have sudden numbness or weakness in the foot or ankle below the affected area, or you have trouble moving your foot or ankle.  You have a fever or persistent symptoms for more than 2-3 days.  You have a fever and your symptoms  suddenly get worse. MAKE SURE YOU:   Understand these instructions.  Will watch your condition.  Will get help right away if you are not doing well or get worse. This information is not intended to replace advice given to you by your health care provider. Make sure you discuss any questions you have with your health care provider. Document Released: 07/27/2006 Document Revised: 01/11/2013 Document Reviewed: 11/28/2012 Elsevier Interactive Patient Education  2017 [employer].

## 2016-04-08 NOTE — Progress Notes (Signed)
Pre visit review using our clinic review tool, if applicable. No additional management support is needed unless otherwise documented below in the visit note. 

## 2016-04-08 NOTE — Progress Notes (Signed)
Subjective:     Patient ID: Micheal Graham, male   DOB: 1955-07-28, 61 y.o.   MRN: [id number]  HPI Patient seen with some late day edema ankles and lower legs. He has noticed this over the past few weeks especially. He has long history of venous stasis and has had varicose vein surgery years ago. He takes Lotrel and metoprolol for hypertension. Denies any dyspnea. No orthopnea. Denies any major dietary changes recently. He did have some diarrhea for about a week and was eating lots of saltine crackers which might have elevated sodium intake transiently but is back to regular diet at this time. His edema resolves almost fully each night when he elevates his legs. His job requires lots of driving. Does not use any compression.  Past Medical History:  Diagnosis Date  . Allergy   . 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 Chronic kidney disease   . Cognitive impairment, mild, so stated 01/23/2014  . Colon polyps   . 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 Hypertension   . Migraines    Past Surgical History:  Procedure Laterality Date  . ABDOMINAL HERNIA REPAIR  2012  . APPENDECTOMY  1974  . left knee  2011   surgery  . VARICOSE VEIN SURGERY      reports that he quit smoking about 16 years ago. His smoking use included Cigarettes. He has a 60.00 pack-year smoking history. He has never used smokeless tobacco. He reports that he does not drink alcohol or use drugs. family history includes Cancer in his maternal uncle; Heart disease in his father; Hypertension in his mother; Stroke in his paternal grandfather. No Known Allergies   Review of Systems  Constitutional: Negative for fatigue and unexpected weight change.  Eyes: Negative for visual disturbance.  Respiratory: Negative for cough, chest tightness and shortness of breath.   Cardiovascular: Positive for leg swelling. Negative for chest pain and palpitations.  Neurological: Negative for dizziness, syncope, weakness, light-headedness and headaches.        Objective:   Physical Exam  Constitutional: He appears well-developed and well-nourished.  Cardiovascular: Normal rate and regular rhythm.  Exam reveals no gallop.   Pulmonary/Chest: Effort normal and breath sounds normal. No respiratory distress. He has no wheezes. He has no rales.  Musculoskeletal: He exhibits edema.  He has varicosities in both legs. Trace pitting edema lower legs bilaterally.       Assessment:     Mild bilateral leg edema. Suspect largely related to venous stasis. No ulcerations and no dermatitis changes.    Plan:     -Recommend knee-high compression garments 20-30 mmHg and wear daily -Continue current medications -Could consider low-dose diuretic as needed but would avoid at this point as his symptoms are relatively mild -Watch sodium intake closely  [name] MD [facility]

## 2016-08-24 ENCOUNTER — Other Ambulatory Visit: Payer: Self-pay | Admitting: Family Medicine

## 2016-09-14 ENCOUNTER — Encounter: Payer: Self-pay | Admitting: Family Medicine

## 2016-11-11 ENCOUNTER — Ambulatory Visit (INDEPENDENT_AMBULATORY_CARE_PROVIDER_SITE_OTHER): Payer: BLUE CROSS/BLUE SHIELD | Admitting: Family Medicine

## 2016-11-11 ENCOUNTER — Encounter: Payer: Self-pay | Admitting: Family Medicine

## 2016-11-11 VITALS — BP 110/80 | HR 77 | Temp 98.3°F | Wt 230.0 lb

## 2016-11-11 DIAGNOSIS — M75102 Unspecified rotator cuff tear or rupture of left shoulder, not specified as traumatic: Secondary | ICD-10-CM | POA: Diagnosis not present

## 2016-11-11 DIAGNOSIS — M6289 Other specified disorders of muscle: Secondary | ICD-10-CM

## 2016-11-11 MED ORDER — METHYLPREDNISOLONE ACETATE 80 MG/ML IJ SUSP
80.0000 mg | Freq: Once | INTRAMUSCULAR | Status: AC
  Administered 2016-11-11: 80 mg via INTRAMUSCULAR

## 2016-11-11 NOTE — Progress Notes (Signed)
Subjective:     Patient ID: Micheal Graham, male   DOB: 06/04/1955, 61 y.o.   MRN: [id number]  HPI Patient seen for the following issues  Left shoulder pain. Approximately 2 month duration. Right-hand dominant. No injury. He's had some chronic neck pains in the past but this pain is different. Worse with abduction at about 90. Denies any prior history of rotator cuff injury. No upper extremity numbness or weakness. Pain becoming progressive at night. No relief with over-the-counter medications  Patient complains of increased muscle fatigue and general upper and lower extremities. Much less muscle stamina than usual with doing activities. Denies any muscle cramps. No statin use.  Muscle fatigue > weakness.  No myalgia.  Past Medical History:  Diagnosis Date  . Allergy   . 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 Chronic kidney disease   . Cognitive impairment, mild, so stated 01/23/2014  . Colon polyps   . 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 Hypertension   . Migraines    Past Surgical History:  Procedure Laterality Date  . ABDOMINAL HERNIA REPAIR  2012  . APPENDECTOMY  1974  . left knee  2011   surgery  . VARICOSE VEIN SURGERY      reports that he quit smoking about 17 years ago. His smoking use included Cigarettes. He has a 60.00 pack-year smoking history. He has never used smokeless tobacco. He reports that he does not drink alcohol or use drugs. family history includes Cancer in his maternal uncle; Heart disease in his father; Hypertension in his mother; Stroke in his paternal grandfather. No Known Allergies   Review of Systems  Constitutional: Negative for fatigue.  Eyes: Negative for visual disturbance.  Respiratory: Negative for cough, chest tightness and shortness of breath.   Cardiovascular: Negative for chest pain, palpitations and leg swelling.  Musculoskeletal: Negative for myalgias.  Skin: Negative for rash.  Neurological: Negative for dizziness, syncope, weakness,  light-headedness and headaches.  Hematological: Negative for adenopathy. Does not bruise/bleed easily.       Objective:   Physical Exam  Constitutional: He is oriented to person, place, and time. He appears well-developed and well-nourished.  HENT:  Right Ear: External ear normal.  Left Ear: External ear normal.  Mouth/Throat: Oropharynx is clear and moist.  Eyes: Pupils are equal, round, and reactive to light.  Neck: Neck supple. No thyromegaly present.  Cardiovascular: Normal rate and regular rhythm.   Pulmonary/Chest: Effort normal and breath sounds normal. No respiratory distress. He has no wheezes. He has no rales.  Musculoskeletal: He exhibits no edema.  No muscle atrophy. Shoulders appear symmetric. Pain with abduction shoulder at 90. No pain with internal rotation. Mild subacromial tenderness left side  Neurological: He is alert and oriented to person, place, and time.       Assessment:     #1 left shoulder pain. Suspect rotator cuff syndrome  #2 increased muscle fatigue    Plan:     -Return for labs with TSH, 25 hydroxy vitamin D, basic metabolic panel, testosterone level - Discussed risks and benefits of corticosteroid injection and patient consented.  After prepping skin with betadine, injected 1 cc depomedrol and 2 cc of plain xylocaine with 25 gauge one and one half inch needle using posterior lateral approach and pt tolerated well. -Gentle range of motion as tolerated. Touch base in 2 weeks if not improving  [name] MD [facility]

## 2016-11-11 NOTE — Patient Instructions (Signed)
Shoulder Impingement Syndrome Shoulder impingement syndrome is a condition that causes pain when connective tissues (tendons) surrounding the shoulder joint become pinched. These tendons are part of the group of muscles and tissues that help to stabilize the shoulder (rotator cuff). Beneath the rotator cuff is a fluid-filled sac (bursa) that allows the muscles and tendons to glide smoothly. The bursa may become swollen or irritated (bursitis). Bursitis, swelling in the rotator cuff tendons, or both conditions can decrease how much space is under a bone in the shoulder joint (acromion), resulting in impingement. What are the causes? Shoulder impingement syndrome can be caused by bursitis or swelling of the rotator cuff tendons, which may result from:  Repetitive overhead arm movements.  Falling onto the shoulder.  Weakness in the shoulder muscles.  What increases the risk? You may be more likely to develop this condition if you are an athlete who participates in:  Sports that involve throwing, such as baseball.  Tennis.  Swimming.  Volleyball.  Some people are also more likely to develop impingement syndrome because of the shape of their acromion bone. What are the signs or symptoms? The main symptom of this condition is pain on the front or side of the shoulder. Pain may:  Get worse when lifting or raising the arm.  Get worse at night.  Wake you up from sleeping.  Feel sharp when the shoulder is moved, and then fade to an ache.  Other signs and symptoms may include:  Tenderness.  Stiffness.  Inability to raise the arm above shoulder level or behind the body.  Weakness.  How is this diagnosed? This condition may be diagnosed based on:  Your symptoms.  Your medical history.  A physical exam.  Imaging tests, such as: ? X-rays. ? MRI. ? Ultrasound.  How is this treated? Treatment for this condition may include:  Resting your shoulder and avoiding all  activities that cause pain or put stress on the shoulder.  Icing your shoulder.  NSAIDs to help reduce pain and swelling.  One or more injections of medicines to numb the area and reduce inflammation.  Physical therapy.  Surgery. This may be needed if nonsurgical treatments have not helped. Surgery may involve repairing the rotator cuff, reshaping the acromion, or removing the bursa.  Follow these instructions at home: Managing pain, stiffness, and swelling  If directed, apply ice to the injured area. ? Put ice in a plastic bag. ? Place a towel between your skin and the bag. ? Leave the ice on for 20 minutes, 2-3 times a day. Activity  Rest and return to your normal activities as told by your health care provider. Ask your health care provider what activities are safe for you.  Do exercises as told by your health care provider. General instructions  Do not use any tobacco products, including cigarettes, chewing tobacco, or e-cigarettes. Tobacco can delay healing. If you need help quitting, ask your health care provider.  Ask your health care provider when it is safe for you to drive.  Take over-the-counter and prescription medicines only as told by your health care provider.  Keep all follow-up visits as told by your health care provider. This is important. How is this prevented?  Give your body time to rest between periods of activity.  Be safe and responsible while being active to avoid falls.  Maintain physical fitness, including strength and flexibility. Contact a health care provider if:  Your symptoms have not improved after 1-2 months of treatment and  rest.  You cannot lift your arm away from your body. This information is not intended to replace advice given to you by your health care provider. Make sure you discuss any questions you have with your health care provider. Document Released: 03/23/2005 Document Revised: 11/28/2015 Document Reviewed:  02/23/2015 Elsevier Interactive Patient Education  [employer].

## 2016-11-12 LAB — BASIC METABOLIC PANEL
BUN: 22 mg/dL (ref 6–23)
CO2: 24 mEq/L (ref 19–32)
Calcium: 8.6 mg/dL (ref 8.4–10.5)
Chloride: 106 mEq/L (ref 96–112)
Creatinine, Ser: 1.46 mg/dL (ref 0.40–1.50)
GFR: 51.86 mL/min — ABNORMAL LOW (ref >60.00)
Glucose, Bld: 168 mg/dL — ABNORMAL HIGH (ref 70–99)
Potassium: 4.2 mEq/L (ref 3.5–5.1)
Sodium: 139 mEq/L (ref 135–145)

## 2016-11-12 LAB — VITAMIN D 25 HYDROXY (VIT D DEFICIENCY, FRACTURES): VITD: 25.01 ng/mL — ABNORMAL LOW (ref 30.00–100.00)

## 2016-11-12 LAB — TESTOSTERONE: Testosterone: 178.17 ng/dL — ABNORMAL LOW (ref 300.00–890.00)

## 2016-11-12 LAB — TSH: TSH: 1.79 u[IU]/mL (ref 0.35–4.50)

## 2016-11-12 NOTE — Addendum Note (Signed)
Addended by: Tomi Likens on: 11/12/2016 07:58 AM   Modules accepted: Orders

## 2016-11-13 ENCOUNTER — Encounter: Payer: Self-pay | Admitting: Family Medicine

## 2016-11-30 ENCOUNTER — Encounter: Payer: Self-pay | Admitting: Family Medicine

## 2016-11-30 ENCOUNTER — Ambulatory Visit (INDEPENDENT_AMBULATORY_CARE_PROVIDER_SITE_OTHER): Payer: BLUE CROSS/BLUE SHIELD | Admitting: Family Medicine

## 2016-11-30 VITALS — BP 110/70 | HR 82 | Temp 97.6°F | Wt 224.4 lb

## 2016-11-30 DIAGNOSIS — Z23 Encounter for immunization: Secondary | ICD-10-CM | POA: Diagnosis not present

## 2016-11-30 DIAGNOSIS — Z8379 Family history of other diseases of the digestive system: Secondary | ICD-10-CM | POA: Diagnosis not present

## 2016-11-30 DIAGNOSIS — E559 Vitamin D deficiency, unspecified: Secondary | ICD-10-CM | POA: Diagnosis not present

## 2016-11-30 DIAGNOSIS — R5383 Other fatigue: Secondary | ICD-10-CM | POA: Diagnosis not present

## 2016-11-30 DIAGNOSIS — R7989 Other specified abnormal findings of blood chemistry: Secondary | ICD-10-CM

## 2016-11-30 DIAGNOSIS — R739 Hyperglycemia, unspecified: Secondary | ICD-10-CM | POA: Diagnosis not present

## 2016-11-30 LAB — CBC WITH DIFFERENTIAL/PLATELET
Basophils Absolute: 0 10*3/uL (ref 0.0–0.1)
Basophils Relative: 0.6 % (ref 0.0–3.0)
Eosinophils Absolute: 0.2 10*3/uL (ref 0.0–0.7)
Eosinophils Relative: 2.7 % (ref 0.0–5.0)
HCT: 40.8 % (ref 39.0–52.0)
Hemoglobin: 13.6 g/dL (ref 13.0–17.0)
Lymphocytes Relative: 28.7 % (ref 12.0–46.0)
Lymphs Abs: 2.2 10*3/uL (ref 0.7–4.0)
MCHC: 33.4 g/dL (ref 30.0–36.0)
MCV: 92.4 fl (ref 78.0–100.0)
Monocytes Absolute: 0.5 10*3/uL (ref 0.1–1.0)
Monocytes Relative: 6.3 % (ref 3.0–12.0)
Neutro Abs: 4.7 10*3/uL (ref 1.4–7.7)
Neutrophils Relative %: 61.7 % (ref 43.0–77.0)
Platelets: 163 10*3/uL (ref 150.0–400.0)
RBC: 4.41 Mil/uL (ref 4.22–5.81)
RDW: 15.8 % — ABNORMAL HIGH (ref 11.5–15.5)
WBC: 7.6 10*3/uL (ref 4.0–10.5)

## 2016-11-30 LAB — BASIC METABOLIC PANEL
BUN: 25 mg/dL — ABNORMAL HIGH (ref 6–23)
CO2: 30 mEq/L (ref 19–32)
Calcium: 9.1 mg/dL (ref 8.4–10.5)
Chloride: 102 mEq/L (ref 96–112)
Creatinine, Ser: 1.44 mg/dL (ref 0.40–1.50)
GFR: 52.68 mL/min — ABNORMAL LOW (ref >60.00)
Glucose, Bld: 100 mg/dL — ABNORMAL HIGH (ref 70–99)
Potassium: 5.2 mEq/L — ABNORMAL HIGH (ref 3.5–5.1)
Sodium: 136 mEq/L (ref 135–145)

## 2016-11-30 LAB — HEMOGLOBIN A1C: Hgb A1c MFr Bld: 5.9 % (ref 4.6–6.5)

## 2016-11-30 LAB — PSA: PSA: 0.22 ng/mL (ref 0.10–4.00)

## 2016-11-30 LAB — TESTOSTERONE: Testosterone: 286.45 ng/dL — ABNORMAL LOW (ref 300.00–890.00)

## 2016-11-30 NOTE — Progress Notes (Signed)
Subjective:     Patient ID: Micheal Graham, male   DOB: September 29, 1955, 61 y.o.   MRN: [id number]  HPI Patient recently seen with increased fatigue. Labs came back with normal TSH, low vitamin D, low testosterone 178. He is here today for follow-up. He also had blood sugar 168 but this was nonfasting. He has had minimally elevated glucose previously. No polyuria or polydipsia.  In addition to fatigue, patient has low initiative frequently and sometimes low libido.  Positive family history of celiac disease in a daughter and couple other relatives. Patient requesting screening. No recent diarrhea. No history of anemia.  Past Medical History:  Diagnosis Date  . Allergy   . 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 Chronic kidney disease   . Cognitive impairment, mild, so stated 01/23/2014  . Colon polyps   . 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 Hypertension   . Migraines    Past Surgical History:  Procedure Laterality Date  . ABDOMINAL HERNIA REPAIR  2012  . APPENDECTOMY  1974  . left knee  2011   surgery  . VARICOSE VEIN SURGERY      reports that he quit smoking about 17 years ago. His smoking use included Cigarettes. He has a 60.00 pack-year smoking history. He has never used smokeless tobacco. He reports that he does not drink alcohol or use drugs. family history includes Cancer in his maternal uncle; Heart disease in his father; Hypertension in his mother; Stroke in his paternal grandfather. No Known Allergies   Review of Systems  Constitutional: Positive for fatigue. Negative for appetite change and unexpected weight change.  Respiratory: Negative for cough and shortness of breath.   Cardiovascular: Negative for chest pain.  Gastrointestinal: Negative for abdominal pain, nausea and vomiting.  Endocrine: Negative for polydipsia and polyuria.  Genitourinary: Negative for dysuria.       Objective:   Physical Exam  Constitutional: He is oriented to person, place, and time. He appears  well-developed and well-nourished.  HENT:  Right Ear: External ear normal.  Left Ear: External ear normal.  Mouth/Throat: Oropharynx is clear and moist.  Eyes: Pupils are equal, round, and reactive to light.  Neck: Neck supple. No thyromegaly present.  Cardiovascular: Normal rate and regular rhythm.   Pulmonary/Chest: Effort normal and breath sounds normal. No respiratory distress. He has no wheezes. He has no rales.  Musculoskeletal: He exhibits no edema.  Neurological: He is alert and oriented to person, place, and time.       Assessment:     #1 fatigue. Question multifactorial. Possibly related to low testosterone  #2 hypertension stable and at goal  #3 low testosterone by recent screen  #4 hyperglycemia with recent nonfasting blood sugar 168  #5 low vitamin D  #6 positive family history celiac disease    Plan:     -Check further labs with fasting glucose, hemoglobin A1c, PSA, CBC, repeat total testosterone level -Patient also requests celiac antibody panel because of family history -Continue vitamin D 2000 international units once daily and consider follow-up vitamin D level in about 3 months  [name] MD [facility]

## 2016-12-01 ENCOUNTER — Other Ambulatory Visit: Payer: Self-pay | Admitting: Family Medicine

## 2016-12-01 MED ORDER — TESTOSTERONE 20.25 MG/ACT (1.62%) TD GEL: 1.0000 | Freq: Every day | TRANSDERMAL | 1 refills | Status: DC

## 2016-12-24 ENCOUNTER — Encounter: Payer: Self-pay | Admitting: Family Medicine

## 2017-01-11 ENCOUNTER — Encounter: Payer: Self-pay | Admitting: Family Medicine

## 2017-01-11 ENCOUNTER — Ambulatory Visit (INDEPENDENT_AMBULATORY_CARE_PROVIDER_SITE_OTHER): Payer: BLUE CROSS/BLUE SHIELD | Admitting: Family Medicine

## 2017-01-11 VITALS — BP 122/78 | HR 76 | Temp 97.5°F | Resp 16 | Wt 224.0 lb

## 2017-01-11 DIAGNOSIS — R7989 Other specified abnormal findings of blood chemistry: Secondary | ICD-10-CM | POA: Diagnosis not present

## 2017-01-11 LAB — TESTOSTERONE: Testosterone: 310.38 ng/dL (ref 300.00–890.00)

## 2017-01-11 NOTE — Progress Notes (Signed)
Subjective:     Patient ID: Micheal Graham, male   DOB: 09-26-1955, 61 y.o.   MRN: [id number]  HPI Patient seen for follow-up regarding low testosterone. Has had decreased libido and low energy. He had 2 successive low total testosterone levels. Recent CBC and PSA normal. Patient elected to go on testosterone replacement. He is currently on AndroGel 1.62% 1 pump spray per arm once daily. He has not seen much change in terms of fatigue thus far but relies this may take more time. He is here today for follow-up levels. Denies any adverse side effects. He does appear to be taking this appropriately in the morning  Past Medical History:  Diagnosis Date  . Allergy   . 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 Chronic kidney disease   . Cognitive impairment, mild, so stated 01/23/2014  . Colon polyps   . 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 Hypertension   . Migraines    Past Surgical History:  Procedure Laterality Date  . ABDOMINAL HERNIA REPAIR  2012  . APPENDECTOMY  1974  . left knee  2011   surgery  . VARICOSE VEIN SURGERY      reports that he quit smoking about 17 years ago. His smoking use included Cigarettes. He has a 60.00 pack-year smoking history. He has never used smokeless tobacco. He reports that he does not drink alcohol or use drugs. family history includes Cancer in his maternal uncle; Heart disease in his father; Hypertension in his mother; Stroke in his paternal grandfather. No Known Allergies   Review of Systems  Constitutional: Negative for fatigue.  Eyes: Negative for visual disturbance.  Respiratory: Negative for cough, chest tightness and shortness of breath.   Cardiovascular: Negative for chest pain, palpitations and leg swelling.  Neurological: Negative for dizziness, syncope, weakness, light-headedness and headaches.       Objective:   Physical Exam  Constitutional: He is oriented to person, place, and time. He appears well-developed and well-nourished.  HENT:  Right  Ear: External ear normal.  Left Ear: External ear normal.  Mouth/Throat: Oropharynx is clear and moist.  Eyes: Pupils are equal, round, and reactive to light.  Neck: Neck supple. No thyromegaly present.  Cardiovascular: Normal rate and regular rhythm.   Pulmonary/Chest: Effort normal and breath sounds normal. No respiratory distress. He has no wheezes. He has no rales.  Musculoskeletal: He exhibits no edema.  Neurological: He is alert and oriented to person, place, and time.       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

## 2017-01-11 NOTE — Patient Instructions (Signed)
Testosterone Why am I having this test? Testosterone is a hormone made by the male's testicles and by the adrenal glands, which are a pair of glands on top of the kidneys. Starting at puberty, testosterone stimulates the development of secondary sex characteristics. This includes a deeper voice, growth of muscles and body hair, and penis enlargement. Females also produce testosterone in both the adrenal glands and ovaries. A male's body converts testosterone into estradiol, the main male sex hormone. An abnormal level of testosterone can cause health issues in both males and females. You may have this test if your health care provider suspects that an abnormal testosterone level is causing or contributing to other health problems. In males, symptoms of an abnormal testosterone level include:  Infertility.  Erectile dysfunction.  Delayed puberty or premature puberty.  In females, symptoms of an abnormally high testosterone level include:  Infertility.  Polycystic ovarian syndrome (PCOS).  Developing masculine features (virilization).  What kind of sample is taken? This test requires a blood sample taken from a vein in your arm or hand. The sample for this test is usually collected in the morning. The amount of testosterone in your blood is highest at that time. What do the results mean? It is your responsibility to obtain your test results. Ask the lab or department performing the test when and how you will get your results. Contact your health care provider to discuss any questions you have about your results. The result of a blood test for testosterone will be given as a range of values. A testosterone level that is outside the normal range may indicate a health problem. Testosterone is measured in nanograms per deciliter (ng/dL). Range of normal values Ranges for normal values may vary among different labs and hospitals. You should always check with your health care provider after  having lab work or other tests done to discuss whether your values are considered within normal limits. Normal levels of total testosterone are as follows:  Male: ? 7 months to 63 years old: less than 30 ng/dL. ? 61-50 years old: less than 300 ng/dL. ? 59-2 years old: 170-540 ng/dL. ? 51-57 years old: 250-910 ng/dL. ? 63 years old and over: 280-1,080 ng/dL.  Male: ? 7 months to 63 years old: less than 30 ng/dL. ? 23-74 years old: less than 40 ng/dL. ? 65-67 years old: less than 60 ng/dL. ? 21-67 years old: less than 70 ng/dL. ? 63 years old and over: less than 70 ng/dL.  Meaning of results outside normal value ranges A testosterone level that is too low or too high can indicate a number of health problems. In males:  A high testosterone level can occur if you: ? Have certain types of tumors. ? Have an overactive thyroid gland (hyperthyroidism). ? Use anabolic steroids. ? Are starting puberty early (precocious puberty). ? Have an inherited disorder that affects the adrenal glands (congenital adrenal hyperplasia).  A low testosterone level can occur if you: ? Have certain genetic diseases. ? Have had certain viral infections, such as mumps. ? Have pituitary disease. ? Have had an injury to the testicles. ? Are an alcoholic.  In females:  A high testosterone level can occur if you have: ? Certain types of tumors. ? An inherited disorder that affects certain cells in the adrenal glands (congenital adrenocortical hyperplasia). ? PCOS.  A low testosterone level does not cause health problems.  Discuss the results of your testosterone test with your health care provider. Your health care  provider will use the results of this test and other tests to make a diagnosis. Talk with your health care provider to discuss your results, treatment options, and if necessary, the need for more tests. Talk with your health care provider if you have any questions about your results. This  information is not intended to replace advice given to you by your health care provider. Make sure you discuss any questions you have with your health care provider. Document Released: 04/09/2004 Document Revised: 11/23/2015 Document Reviewed: 07/19/2013 Elsevier Interactive Patient Education  [employer].

## 2017-01-24 ENCOUNTER — Encounter: Payer: Self-pay | Admitting: Family Medicine

## 2017-01-27 ENCOUNTER — Other Ambulatory Visit: Payer: Self-pay | Admitting: Family Medicine

## 2017-02-07 ENCOUNTER — Encounter: Payer: Self-pay | Admitting: Family Medicine

## 2017-02-08 MED ORDER — TESTOSTERONE 20.25 MG/ACT (1.62%) TD GEL: TRANSDERMAL | 1 refills | Status: DC

## 2017-02-08 NOTE — Telephone Encounter (Signed)
Rx called in and med list updated.

## 2017-02-09 ENCOUNTER — Encounter: Payer: Self-pay | Admitting: Family Medicine

## 2017-02-09 ENCOUNTER — Other Ambulatory Visit: Payer: Self-pay | Admitting: Family Medicine

## 2017-02-10 ENCOUNTER — Telehealth: Payer: Self-pay | Admitting: Family Medicine

## 2017-02-10 MED ORDER — TESTOSTERONE 20.25 MG/ACT (1.62%) TD GEL: TRANSDERMAL | 1 refills | Status: DC

## 2017-02-10 NOTE — Telephone Encounter (Signed)
rx called in

## 2017-02-10 NOTE — Telephone Encounter (Signed)
° ° ° ° °  The below was sent to the wrong pharmacy    Testosterone 20.25 MG/ACT (1.62%) GEL    [facility] [place]

## 2017-02-11 ENCOUNTER — Encounter: Payer: Self-pay | Admitting: Family Medicine

## 2017-02-12 NOTE — Telephone Encounter (Signed)
Error

## 2017-02-21 ENCOUNTER — Other Ambulatory Visit: Payer: Self-pay | Admitting: Family Medicine

## 2017-02-21 ENCOUNTER — Encounter: Payer: Self-pay | Admitting: Family Medicine

## 2017-02-22 ENCOUNTER — Telehealth: Payer: Self-pay | Admitting: Family Medicine

## 2017-02-22 MED ORDER — TESTOSTERONE 20.25 MG/ACT (1.62%) TD GEL: TRANSDERMAL | 1 refills | Status: DC

## 2017-02-22 NOTE — Telephone Encounter (Signed)
Okay to print a new Rx for Andro Gel?

## 2017-02-22 NOTE — Addendum Note (Signed)
Addended by: Westley Hummer B on: 02/22/2017 02:46 PM   Modules accepted: Orders

## 2017-02-22 NOTE — Telephone Encounter (Signed)
Prescription faxed and confirmed

## 2017-02-22 NOTE — Telephone Encounter (Signed)
Per Joellen Jersey [facility] request is for MD's ofice tosend hard copy so that it can be placed on file;  Fax number is [phone]; will route to [facility] pool; see CRM #[id number] dated 02/22/17

## 2017-02-22 NOTE — Telephone Encounter (Signed)
Copied from [place] [id number]. Topic: Inquiry >> Feb 22, 2017 12:03 PM Oliver Pila B wrote: Reason for CRM: Pam from [facility] called and stated they had a floater last week that may have misplace the Rx for Andra Gel for the pt, and the pharmacy is needing a hard copy of the Rx faxed to [phone], contact the [facility] on [facility] for any questions

## 2017-02-22 NOTE — Telephone Encounter (Signed)
OK 

## 2017-03-25 ENCOUNTER — Other Ambulatory Visit: Payer: Self-pay | Admitting: Family Medicine

## 2017-03-26 NOTE — Telephone Encounter (Signed)
Refills OK.

## 2017-03-26 NOTE — Telephone Encounter (Signed)
Last refill 02/22/17 and last office visit with labs 01/11/17.  Okay to fill?

## 2017-03-29 ENCOUNTER — Telehealth: Payer: Self-pay

## 2017-03-29 NOTE — Telephone Encounter (Signed)
Spoke with pt pharmacy stated that pt has a refill on hold due to refill too soon, pharmacist stated that Rx is due for refill on 04/02/2017.

## 2017-04-07 ENCOUNTER — Encounter: Payer: Self-pay | Admitting: Family Medicine

## 2017-04-07 ENCOUNTER — Other Ambulatory Visit: Payer: Self-pay | Admitting: Family Medicine

## 2017-04-07 NOTE — Telephone Encounter (Signed)
Last refill 03/29/17 and last office visit with labs 01/11/17.  Okay to fill?

## 2017-04-08 MED ORDER — TESTOSTERONE 20.25 MG/ACT (1.62%) TD GEL: TRANSDERMAL | 1 refills | Status: DC

## 2017-04-21 ENCOUNTER — Other Ambulatory Visit: Payer: Self-pay | Admitting: *Deleted

## 2017-04-21 MED ORDER — SERTRALINE HCL 100 MG PO TABS: 100.0000 mg | ORAL_TABLET | Freq: Every day | ORAL | 1 refills | Status: DC

## 2017-05-27 ENCOUNTER — Encounter: Payer: Self-pay | Admitting: Family Medicine

## 2017-05-28 MED ORDER — TESTOSTERONE 20.25 MG/ACT (1.62%) TD GEL: TRANSDERMAL | 1 refills | Status: DC

## 2017-06-09 ENCOUNTER — Encounter: Payer: Self-pay | Admitting: Family Medicine

## 2017-06-09 ENCOUNTER — Ambulatory Visit: Payer: BLUE CROSS/BLUE SHIELD | Admitting: Family Medicine

## 2017-06-09 VITALS — BP 110/70 | HR 71 | Temp 98.3°F | Wt 232.2 lb

## 2017-06-09 DIAGNOSIS — F419 Anxiety disorder, unspecified: Secondary | ICD-10-CM

## 2017-06-09 DIAGNOSIS — R7989 Other specified abnormal findings of blood chemistry: Secondary | ICD-10-CM

## 2017-06-09 DIAGNOSIS — I10 Essential (primary) hypertension: Secondary | ICD-10-CM | POA: Diagnosis not present

## 2017-06-09 LAB — TESTOSTERONE: Testosterone: 337.18 ng/dL (ref 300.00–890.00)

## 2017-06-09 MED ORDER — SERTRALINE HCL 100 MG PO TABS: 100.0000 mg | ORAL_TABLET | Freq: Every day | ORAL | 3 refills | Status: DC

## 2017-06-09 MED ORDER — TESTOSTERONE 20.25 MG/ACT (1.62%) TD GEL: TRANSDERMAL | 5 refills | Status: DC

## 2017-06-09 MED ORDER — METOPROLOL SUCCINATE ER 50 MG PO TB24: ORAL_TABLET | ORAL | 3 refills | Status: DC

## 2017-06-09 NOTE — Progress Notes (Signed)
Subjective:     Patient ID: Micheal Graham, male   DOB: August 01, 1955, 62 y.o.   MRN: [id number]  HPI Patient here for medical follow-up. His chronic problems include history of hypertension, chronic anxiety, low testosterone  Needs refills of sertraline and metoprolol. Compliant with medications. Blood pressures been well controlled.  Anxiety symptoms improved and stable on Sertraline.  Recently went on testosterone replacement. His fatigue is slightly less. He is currently on AndroGel pump spray 3 sprays total per day. Most recent testosterone level 310. PSA was normal.  Past Medical History:  Diagnosis Date  . Allergy   . 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 Chronic kidney disease   . Cognitive impairment, mild, so stated 01/23/2014  . Colon polyps   . 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 Hypertension   . Migraines    Past Surgical History:  Procedure Laterality Date  . ABDOMINAL HERNIA REPAIR  2012  . APPENDECTOMY  1974  . left knee  2011   surgery  . VARICOSE VEIN SURGERY      reports that he quit smoking about 17 years ago. His smoking use included cigarettes. He has a 60.00 pack-year smoking history. he has never used smokeless tobacco. He reports that he does not drink alcohol or use drugs. family history includes Cancer in his maternal uncle; Heart disease in his father; Hypertension in his mother; Stroke in his paternal grandfather. No Known Allergies   Review of Systems  Constitutional: Negative for fatigue.  Eyes: Negative for visual disturbance.  Respiratory: Negative for cough, chest tightness and shortness of breath.   Cardiovascular: Negative for chest pain, palpitations and leg swelling.  Neurological: Negative for dizziness, syncope, weakness, light-headedness and headaches.       Objective:   Physical Exam  Constitutional: He is oriented to person, place, and time. He appears well-developed and well-nourished.  HENT:  Right Ear: External ear normal.  Left Ear:  External ear normal.  Mouth/Throat: Oropharynx is clear and moist.  Eyes: Pupils are equal, round, and reactive to light.  Neck: Neck supple. No thyromegaly present.  Cardiovascular: Normal rate and regular rhythm.  Pulmonary/Chest: Effort normal and breath sounds normal. No respiratory distress. He has no wheezes. He has no rales.  Musculoskeletal: He exhibits no edema.  Neurological: He is alert and oriented to person, place, and time.       Assessment:     #1 hypertension stable and at goal  #2 low testosterone  #3 history of chronic anxiety stable on sertraline    Plan:     -Refilled sertraline and metoprolol for one year -Repeat total testosterone today and titrate up AndroGel pump spray dose if indicated -We'll plan repeat CBC in 6 months  [name] MD [facility] at [facility]

## 2017-06-11 ENCOUNTER — Encounter: Payer: Self-pay | Admitting: Family Medicine

## 2017-07-29 ENCOUNTER — Encounter: Payer: Self-pay | Admitting: Family Medicine

## 2017-07-30 MED ORDER — SERTRALINE HCL 100 MG PO TABS: 100.0000 mg | ORAL_TABLET | Freq: Every day | ORAL | 3 refills | Status: DC

## 2017-08-23 ENCOUNTER — Encounter: Payer: Self-pay | Admitting: Family Medicine

## 2017-08-23 ENCOUNTER — Ambulatory Visit: Payer: BLUE CROSS/BLUE SHIELD | Admitting: Family Medicine

## 2017-08-23 VITALS — BP 120/70 | HR 73 | Temp 98.3°F | Wt 233.8 lb

## 2017-08-23 DIAGNOSIS — R7989 Other specified abnormal findings of blood chemistry: Secondary | ICD-10-CM

## 2017-08-23 DIAGNOSIS — M7582 Other shoulder lesions, left shoulder: Secondary | ICD-10-CM | POA: Diagnosis not present

## 2017-08-23 DIAGNOSIS — M25512 Pain in left shoulder: Secondary | ICD-10-CM

## 2017-08-23 DIAGNOSIS — N529 Male erectile dysfunction, unspecified: Secondary | ICD-10-CM | POA: Diagnosis not present

## 2017-08-23 LAB — TESTOSTERONE: Testosterone: 320.75 ng/dL (ref 300.00–890.00)

## 2017-08-23 MED ORDER — SILDENAFIL CITRATE 100 MG PO TABS: 50.0000 mg | ORAL_TABLET | Freq: Every day | ORAL | 11 refills | Status: DC | PRN

## 2017-08-23 MED ORDER — METHYLPREDNISOLONE ACETATE 40 MG/ML IJ SUSP
40.0000 mg | Freq: Once | INTRAMUSCULAR | Status: AC
  Administered 2017-08-23: 40 mg via INTRA_ARTICULAR

## 2017-08-23 NOTE — Progress Notes (Signed)
Subjective:     Patient ID: Micheal Graham, male   DOB: 12-22-1955, 62 y.o.   MRN: [id number]  HPI Patient seen for several issues  Left shoulder pain. Recurrence about a month ago. No specific injury. He had similar issue back in August and received steroid injection which helped tremendously. He has pain with abduction and somewhat internal rotation. No neck pain. No radiculitis symptoms. No weakness. Pain has been progressive over the past month. He will like to get this injected again if possible  Patient has been on Zoloft 100 mg daily for increased anxiety symptoms. He states he's had some increased dreams recently and thinks this may be related to the sertraline. Pt would like to reduce to 50 mg. Denies any depression symptoms and anxiety is very stable last time  Low testosterone. Currently on topical with 2 pumps sprays per shoulder once daily. Last testosterone was normal range but low end of normal.  Erectile dysfunction. Has taken Viagra previously. No nitroglycerin use  Past Medical History:  Diagnosis Date  . Allergy   . 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 Chronic kidney disease   . Cognitive impairment, mild, so stated 01/23/2014  . Colon polyps   . 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 Hypertension   . Migraines    Past Surgical History:  Procedure Laterality Date  . ABDOMINAL HERNIA REPAIR  2012  . APPENDECTOMY  1974  . left knee  2011   surgery  . VARICOSE VEIN SURGERY      reports that he quit smoking about 17 years ago. His smoking use included cigarettes. He has a 60.00 pack-year smoking history. He has never used smokeless tobacco. He reports that he does not drink alcohol or use drugs. family history includes Cancer in his maternal uncle; Heart disease in his father; Hypertension in his mother; Stroke in his paternal grandfather. No Known Allergies   Review of Systems  Constitutional: Negative for chills and fever.  Respiratory: Negative for shortness of  breath.   Cardiovascular: Negative for chest pain.  Musculoskeletal: Positive for arthralgias.  Neurological: Negative for weakness.  Psychiatric/Behavioral: Positive for sleep disturbance. Negative for dysphoric mood.       Objective:   Physical Exam  Constitutional: He appears well-developed and well-nourished.  Cardiovascular: Normal rate and regular rhythm.  Pulmonary/Chest: Effort normal and breath sounds normal. He has no wheezes. He has no rales.  Musculoskeletal:  Patient has fairly good range of motion left shoulder. No localized tenderness. Pain with abduction and internal rotation       Assessment:     #1 recurrent left shoulder pain. Suspect rotator cuff tendinitis  #2 low testosterone currently on topical replacement  #3 possible side effect with sertraline. He is concerned because of increased dreams as above  #4 erectile dysfunction    Plan:     - Discussed risks and benefits of corticosteroid injection and patient consented.  After prepping skin with betadine, injected 40 mg depomedrol and 2 cc of plain xylocaine with 25 gauge one and one half inch needle using posterior lateral approach and pt tolerated well. -Patient will decrease sertraline from 100 to 50 mg. -Recheck total testosterone level -We discussed that if levels are still low end of normal, next option would be injection therapy with intramuscular replacement though he is not interested at this time -Prescription for Viagra 100 mg one half to one tablet daily as needed  [name] MD [facility]

## 2017-08-23 NOTE — Patient Instructions (Signed)
Reduce the Sertraline to one half at night.

## 2017-08-28 ENCOUNTER — Other Ambulatory Visit: Payer: Self-pay

## 2017-08-28 ENCOUNTER — Encounter (HOSPITAL_COMMUNITY): Payer: Self-pay

## 2017-08-28 ENCOUNTER — Emergency Department (HOSPITAL_COMMUNITY)
Admission: EM | Admit: 2017-08-28 | Discharge: 2017-08-28 | Disposition: A | Discharge status: Home / Self Care | Payer: BLUE CROSS/BLUE SHIELD | Attending: Emergency Medicine | Admitting: Emergency Medicine

## 2017-08-28 ENCOUNTER — Emergency Department (HOSPITAL_COMMUNITY): Payer: BLUE CROSS/BLUE SHIELD

## 2017-08-28 DIAGNOSIS — N189 Chronic kidney disease, unspecified: Secondary | ICD-10-CM | POA: Diagnosis not present

## 2017-08-28 DIAGNOSIS — Z87891 Personal history of nicotine dependence: Secondary | ICD-10-CM | POA: Diagnosis not present

## 2017-08-28 DIAGNOSIS — I129 Hypertensive chronic kidney disease with stage 1 through stage 4 chronic kidney disease, or unspecified chronic kidney disease: Secondary | ICD-10-CM | POA: Insufficient documentation

## 2017-08-28 DIAGNOSIS — Z85828 Personal history of other malignant neoplasm of skin: Secondary | ICD-10-CM | POA: Diagnosis not present

## 2017-08-28 DIAGNOSIS — R55 Syncope and collapse: Secondary | ICD-10-CM | POA: Diagnosis not present

## 2017-08-28 DIAGNOSIS — Z79899 Other long term (current) drug therapy: Secondary | ICD-10-CM | POA: Diagnosis not present

## 2017-08-28 DIAGNOSIS — Y92007 Garden or yard of unspecified non-institutional (private) residence as the place of occurrence of the external cause: Secondary | ICD-10-CM | POA: Diagnosis not present

## 2017-08-28 DIAGNOSIS — Y999 Unspecified external cause status: Secondary | ICD-10-CM | POA: Diagnosis not present

## 2017-08-28 DIAGNOSIS — Y93H2 Activity, gardening and landscaping: Secondary | ICD-10-CM | POA: Insufficient documentation

## 2017-08-28 DIAGNOSIS — S60222A Contusion of left hand, initial encounter: Secondary | ICD-10-CM | POA: Diagnosis not present

## 2017-08-28 DIAGNOSIS — Z23 Encounter for immunization: Secondary | ICD-10-CM | POA: Diagnosis not present

## 2017-08-28 DIAGNOSIS — W0110XA Fall on same level from slipping, tripping and stumbling with subsequent striking against unspecified object, initial encounter: Secondary | ICD-10-CM | POA: Insufficient documentation

## 2017-08-28 LAB — CBC
HCT: 43.6 % (ref 39.0–52.0)
Hemoglobin: 14.4 g/dL (ref 13.0–17.0)
MCH: 31.2 pg (ref 26.0–34.0)
MCHC: 33 g/dL (ref 30.0–36.0)
MCV: 94.6 fL (ref 78.0–100.0)
Platelets: 173 10*3/uL (ref 150–400)
RBC: 4.61 MIL/uL (ref 4.22–5.81)
RDW: 16 % — ABNORMAL HIGH (ref 11.5–15.5)
WBC: 11.5 10*3/uL — ABNORMAL HIGH (ref 4.0–10.5)

## 2017-08-28 LAB — URINALYSIS, ROUTINE W REFLEX MICROSCOPIC
Bacteria, UA: NONE SEEN
Bilirubin Urine: NEGATIVE
Glucose, UA: NEGATIVE mg/dL
Hgb urine dipstick: NEGATIVE
Ketones, ur: NEGATIVE mg/dL
Leukocytes, UA: NEGATIVE
Nitrite: NEGATIVE
Protein, ur: 30 mg/dL — AB
Specific Gravity, Urine: 1.023 (ref 1.005–1.030)
pH: 5 (ref 5.0–8.0)

## 2017-08-28 LAB — BASIC METABOLIC PANEL WITH GFR
Anion gap: 10 (ref 5–15)
BUN: 32 mg/dL — ABNORMAL HIGH (ref 6–20)
CO2: 23 mmol/L (ref 22–32)
Calcium: 9.1 mg/dL (ref 8.9–10.3)
Chloride: 107 mmol/L (ref 101–111)
Creatinine, Ser: 1.94 mg/dL — ABNORMAL HIGH (ref 0.61–1.24)
GFR calc Af Amer: 41 mL/min — ABNORMAL LOW
GFR calc non Af Amer: 35 mL/min — ABNORMAL LOW
Glucose, Bld: 107 mg/dL — ABNORMAL HIGH (ref 65–99)
Potassium: 5.1 mmol/L (ref 3.5–5.1)
Sodium: 140 mmol/L (ref 135–145)

## 2017-08-28 LAB — CBG MONITORING, ED: Glucose-Capillary: 144 mg/dL — ABNORMAL HIGH (ref 65–99)

## 2017-08-28 LAB — I-STAT TROPONIN, ED: Troponin i, poc: 0.01 ng/mL (ref 0.00–0.08)

## 2017-08-28 MED ORDER — TETANUS-DIPHTH-ACELL PERTUSSIS 5-2.5-18.5 LF-MCG/0.5 IM SUSP
0.5000 mL | Freq: Once | INTRAMUSCULAR | Status: AC
  Administered 2017-08-28: 0.5 mL via INTRAMUSCULAR
  Filled 2017-08-28: qty 0.5

## 2017-08-28 MED ORDER — SODIUM CHLORIDE 0.9 % IV BOLUS
1000.0000 mL | Freq: Once | INTRAVENOUS | Status: AC
  Administered 2017-08-28: 1000 mL via INTRAVENOUS

## 2017-08-28 MED ORDER — SULFAMETHOXAZOLE-TRIMETHOPRIM 800-160 MG PO TABS: 1.0000 | ORAL_TABLET | Freq: Two times a day (BID) | ORAL | 0 refills | Status: AC

## 2017-08-28 NOTE — ED Triage Notes (Signed)
He states he was outside on hands and knees to "turn off a faucet; and I got up too fast, got lightheaded and passed out". During this incident, he landed on the ground and has ecchymosis and gravel ground into his skin at post. Lat. Left hand. He is alert and oriented x 4 with clear speech.

## 2017-08-28 NOTE — ED Notes (Signed)
Urine culture in lab if needed.

## 2017-08-28 NOTE — ED Notes (Signed)
Wedding band cut off as requested.

## 2017-08-28 NOTE — ED Provider Notes (Signed)
[facility] DEPT Provider Note   CSN: [id number] Arrival date & time: 08/28/17  1747     History   Chief Complaint Chief Complaint  Patient presents with  . Loss of Consciousness    HPI Micheal Graham is a 62 y.o. male.  Patient states that he bent over while he was working outside and when he got up he got dizzy and passed out.  Patient hit his hand and his face.  Patient feels better now  The history is provided by the patient. No language interpreter was used.  Loss of Consciousness   This is a new problem. The current episode started 6 to 12 hours ago. The problem occurs rarely. The problem has been resolved. He lost consciousness for a period of less than one minute. The problem is associated with normal activity. Associated symptoms include dizziness. Pertinent negatives include abdominal pain, back pain, chest pain, congestion, fever, headaches and seizures.    Past Medical History:  Diagnosis Date  . Allergy   . 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 Chronic kidney disease   . Cognitive impairment, mild, so stated 01/23/2014  . Colon polyps   . 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 Hypertension   . Migraines     Patient Active Problem List   Diagnosis Date Noted  . Low testosterone 11/30/2016  . Vitamin D deficiency 11/30/2016  . Carpal tunnel syndrome of right wrist 02/14/2014  . Hereditary and idiopathic peripheral neuropathy 02/14/2014  . Cognitive impairment, mild, so stated 01/23/2014  . Ataxia 01/23/2014  . 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 Hypertension 09/09/2011  . Kidney stones 09/09/2011  . Anxiety 09/09/2011  . Colon polyps 09/09/2011  . Basal cell cancer 09/09/2011    Past Surgical History:  Procedure Laterality Date  . ABDOMINAL HERNIA REPAIR  2012  . APPENDECTOMY  1974  . left knee  2011   surgery  . VARICOSE VEIN SURGERY          Home Medications    Prior to Admission medications   Medication Sig Start Date  End Date Taking? Authorizing Provider  amLODipine-benazepril (LOTREL) 5-20 MG capsule TAKE 1 CAPSULE BY MOUTH EVERY DAY 04/07/17   Burchette, Alinda Sierras, MD  Cholecalciferol (VITAMIN D) 2000 units CAPS Take by mouth.    [provider]  diphenhydramine-acetaminophen (TYLENOL PM) 25-500 MG TABS Take 2 tablets by mouth at bedtime.    [provider]  fexofenadine (ALLEGRA) 180 MG tablet Take 180 mg by mouth daily.    [provider]  metoprolol succinate (TOPROL-XL) 50 MG 24 hr tablet TAKE 1 TABLET BY MOUTH EVERY DAY IMMEDIATELY FOLLOWING A MEAL 06/09/17   Burchette, Alinda Sierras, MD  Prenatal Multivit-Min-Fe-FA (PRENATAL VITAMINS PO) Take by mouth daily.    [provider]  sertraline (ZOLOFT) 100 MG tablet Take 1 tablet (100 mg total) by mouth daily. 07/30/17   Burchette, Alinda Sierras, MD  sildenafil (VIAGRA) 100 MG tablet Take 0.5-1 tablets (50-100 mg total) by mouth daily as needed for erectile dysfunction. 08/23/17   Burchette, Alinda Sierras, MD  sulfamethoxazole-trimethoprim (BACTRIM DS,SEPTRA DS) 800-160 MG tablet Take 1 tablet by mouth 2 (two) times daily for 7 days. 08/28/17 09/04/17  Milton Ferguson, MD  Testosterone (ANDROGEL PUMP) 20.25 MG/ACT (1.62%) GEL Use 2 pumps on each arm daily (4 pumps total per day)= 15 day supply 06/09/17   Burchette, Alinda Sierras, MD    Family History Family History  Problem Relation Age  of Onset  . Hypertension Mother   . Heart disease Father   . Cancer Maternal Uncle        prostate  . Stroke Paternal Grandfather     Social History Social History   Tobacco Use  . Smoking status: Former Smoker    Packs/day: 2.00    Years: 30.00    Pack years: 60.00    Types: Cigarettes    Last attempt to quit: 09/09/1999    Years since quitting: 17.9  . Smokeless tobacco: Never Used  Substance Use Topics  . Alcohol use: No    Alcohol/week: 0.0 oz    Comment: occasional beer  . Drug use: No     Allergies   Patient has no known allergies.   Review of  Systems Review of Systems  Constitutional: Negative for appetite change, fatigue and fever.  HENT: Negative for congestion, ear discharge and sinus pressure.   Eyes: Negative for discharge.  Respiratory: Negative for cough.   Cardiovascular: Positive for syncope. Negative for chest pain.  Gastrointestinal: Negative for abdominal pain and diarrhea.  Genitourinary: Negative for frequency and hematuria.  Musculoskeletal: Negative for back pain.  Skin: Negative for rash.  Neurological: Positive for dizziness. Negative for seizures and headaches.  Psychiatric/Behavioral: Negative for hallucinations.     Physical Exam Updated Vital Signs BP 133/81   Pulse (!) 57   Temp 98.1 F (36.7 C) (Oral)   Resp 17   SpO2 97%   Physical Exam  Constitutional: He is oriented to person, place, and time. He appears well-developed.  HENT:  Head: Normocephalic.  Abrasion to face  Eyes: Conjunctivae and EOM are normal. No scleral icterus.  Neck: Neck supple. No thyromegaly present.  Cardiovascular: Normal rate and regular rhythm. Exam reveals no gallop and no friction rub.  No murmur heard. Pulmonary/Chest: No stridor. He has no wheezes. He has no rales. He exhibits no tenderness.  Abdominal: He exhibits no distension. There is no tenderness. There is no rebound.  Musculoskeletal: Normal range of motion. He exhibits no edema.  Abrasions to left hand  Lymphadenopathy:    He has no cervical adenopathy.  Neurological: He is oriented to person, place, and time. He exhibits normal muscle tone. Coordination normal.  Skin: No rash noted. No erythema.  Psychiatric: He has a normal mood and affect. His behavior is normal.     ED Treatments / Results  Labs (all labs ordered are listed, but only abnormal results are displayed) Labs Reviewed  BASIC METABOLIC PANEL - Abnormal; Notable for the following components:      Result Value   Glucose, Bld 107 (*)    BUN 32 (*)    Creatinine, Ser 1.94 (*)     GFR calc non Af Amer 35 (*)    GFR calc Af Amer 41 (*)    All other components within normal limits  CBC - Abnormal; Notable for the following components:   WBC 11.5 (*)    RDW 16.0 (*)    All other components within normal limits  URINALYSIS, ROUTINE W REFLEX MICROSCOPIC - Abnormal; Notable for the following components:   Protein, ur 30 (*)    All other components within normal limits  CBG MONITORING, ED - Abnormal; Notable for the following components:   Glucose-Capillary 144 (*)    All other components within normal limits  I-STAT TROPONIN, ED    EKG EKG Interpretation  Date/Time:  Saturday Aug 28 2017 17:56:30 EDT Ventricular Rate:  67 PR Interval:  QRS Duration: 104 QT Interval:  406 QTC Calculation: 429 R Axis:   49 Text Interpretation:  Sinus rhythm Minimal ST elevation, inferior leads Confirmed by Milton Ferguson [phone]) on 08/28/2017 10:42:25 PM   Radiology Dg Chest 2 View  Result Date: 08/28/2017 CLINICAL DATA:  Status post syncopal episode and fall. EXAM: CHEST - 2 VIEW COMPARISON:  03/26/2014 FINDINGS: Cardiomediastinal silhouette is normal. Mediastinal contours appear intact. There is no evidence of focal airspace consolidation, pleural effusion or pneumothorax. Osseous structures are without acute abnormality. Soft tissues are grossly normal. IMPRESSION: No active cardiopulmonary disease. Electronically Signed   By: Fidela Salisbury M.D.   On: 08/28/2017 21:17   Dg Hand Complete Left  Result Date: 08/28/2017 CLINICAL DATA:  Patient stood up too fast, got lightheaded, and passed out. Left hand swelling with bruising and gravel in the skin. EXAM: LEFT HAND - COMPLETE 3+ VIEW COMPARISON:  None. FINDINGS: Degenerative changes in the interphalangeal joints, first carpometacarpal joint, STT, and radiocarpal joints. No evidence of acute fracture or dislocation. No focal bone lesion or bone destruction. There a few tiny punctate foci of increased density projected in or  over the soft tissues of the ulnar aspect of the left hand and wrist. These may represent surface or subcutaneous foreign bodies. IMPRESSION: No acute bony abnormalities. Degenerative changes in the left hand and wrist. Tiny punctate foreign material demonstrated along the medial aspect of the left hand and wrist, possibly surface or subcutaneous foreign bodies. Electronically Signed   By: Lucienne Capers M.D.   On: 08/28/2017 21:19    Procedures Procedures (including critical care time)  Medications Ordered in ED Medications  sodium chloride 0.9 % bolus 1,000 mL (0 mLs Intravenous Stopped 08/28/17 2244)  Tdap (BOOSTRIX) injection 0.5 mL (0.5 mLs Intramuscular Given 08/28/17 2200)     Initial Impression / Assessment and Plan / ED Course  I have reviewed the triage vital signs and the nursing notes.  Pertinent labs & imaging results that were available during my care of the patient were reviewed by me and considered in my medical decision making (see chart for details).     Labs show patient is dehydrated.  Patient given 1 L of fluid and felt much better.  X-rays unremarkable.  Patient has a contusion and abrasion left hand.  We will place him on Bactrim for prophylactic antibiotics  Final Clinical Impressions(s) / ED Diagnoses   Final diagnoses:  Near syncope  Contusion of left hand, initial encounter    ED Discharge Orders        Ordered    sulfamethoxazole-trimethoprim (BACTRIM DS,SEPTRA DS) 800-160 MG tablet  2 times daily     08/28/17 2247       Milton Ferguson, MD 08/28/17 2250

## 2017-08-28 NOTE — Discharge Instructions (Addendum)
Drink plenty of fluids and follow-up with your doctor next week for recheck.  Clean hand twice a day with soap and water gently

## 2017-12-28 ENCOUNTER — Other Ambulatory Visit: Payer: Self-pay | Admitting: Family Medicine

## 2017-12-28 NOTE — Telephone Encounter (Signed)
He may get from work (not sure).  We can offer CPE but certainly don't require it for med refill such as BP med.  OK to refill for one year.

## 2017-12-28 NOTE — Telephone Encounter (Signed)
Last OV 08/23/17, No future OV  I do not see where this patient has had a CPE. Does he get at work or is okay to fill for patient with note to schedule physical for further refills? Please advise. Thank you!

## 2018-01-07 ENCOUNTER — Other Ambulatory Visit: Payer: Self-pay | Admitting: Family Medicine

## 2018-01-07 NOTE — Telephone Encounter (Signed)
Last OV 08/23/17, No future OV  Last filled 06/09/17, # 150 g with 5 refills

## 2018-01-09 NOTE — Telephone Encounter (Signed)
May refill once.  He needs follow up for repeat PSA and CBC within next month or so.

## 2018-01-10 ENCOUNTER — Other Ambulatory Visit: Payer: Self-pay

## 2018-01-10 ENCOUNTER — Telehealth: Payer: Self-pay

## 2018-01-10 DIAGNOSIS — R7989 Other specified abnormal findings of blood chemistry: Secondary | ICD-10-CM

## 2018-01-10 NOTE — Telephone Encounter (Signed)
Called patient and left a voice message to call back to schedule a visit to check his PSA and CBC.

## 2018-01-26 ENCOUNTER — Other Ambulatory Visit: Payer: Self-pay

## 2018-01-26 ENCOUNTER — Encounter: Payer: Self-pay | Admitting: Family Medicine

## 2018-01-26 ENCOUNTER — Ambulatory Visit: Payer: BLUE CROSS/BLUE SHIELD | Admitting: Family Medicine

## 2018-01-26 VITALS — BP 118/76 | HR 69 | Temp 98.0°F | Wt 233.7 lb

## 2018-01-26 DIAGNOSIS — R195 Other fecal abnormalities: Secondary | ICD-10-CM | POA: Diagnosis not present

## 2018-01-26 DIAGNOSIS — R140 Abdominal distension (gaseous): Secondary | ICD-10-CM | POA: Diagnosis not present

## 2018-01-26 DIAGNOSIS — Z23 Encounter for immunization: Secondary | ICD-10-CM

## 2018-01-26 DIAGNOSIS — R143 Flatulence: Secondary | ICD-10-CM | POA: Diagnosis not present

## 2018-01-26 LAB — CBC WITH DIFFERENTIAL/PLATELET
Basophils Absolute: 0 10*3/uL (ref 0.0–0.1)
Basophils Relative: 0.5 % (ref 0.0–3.0)
Eosinophils Absolute: 0.2 10*3/uL (ref 0.0–0.7)
Eosinophils Relative: 2.4 % (ref 0.0–5.0)
HCT: 41.8 % (ref 39.0–52.0)
Hemoglobin: 14.4 g/dL (ref 13.0–17.0)
Lymphocytes Relative: 29.4 % (ref 12.0–46.0)
Lymphs Abs: 2.3 10*3/uL (ref 0.7–4.0)
MCHC: 34.5 g/dL (ref 30.0–36.0)
MCV: 91.8 fl (ref 78.0–100.0)
Monocytes Absolute: 0.5 10*3/uL (ref 0.1–1.0)
Monocytes Relative: 7.1 % (ref 3.0–12.0)
Neutro Abs: 4.7 10*3/uL (ref 1.4–7.7)
Neutrophils Relative %: 60.6 % (ref 43.0–77.0)
Platelets: 188 10*3/uL (ref 150.0–400.0)
RBC: 4.55 Mil/uL (ref 4.22–5.81)
RDW: 15.5 % (ref 11.5–15.5)
WBC: 7.7 10*3/uL (ref 4.0–10.5)

## 2018-01-26 NOTE — Progress Notes (Signed)
  Subjective:     Patient ID: Micheal Graham, male   DOB: [birth year], 62 y.o.   MRN: [id number]  HPI Patient was seen with over 1 month history of abdominal symptoms including frequent bloating, increased flatulence, and frequent loose stools and occasional explosive stool.  He has occasional GERD symptoms.  No history of lactose intolerance.  No history of known gluten sensitivity, though his daughter has been diagnosed with celiac disease.  No right upper quadrant pain.  No fevers or chills.  No appetite or weight changes.  He had colonoscopy May 2016.  No clear provoking factors.  No alleviating factors.  Denies any true abdominal pain- really has more bloating type symptoms.  Denies any history of constipation.  Past Medical History:  Diagnosis Date  . Allergy   . 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 Chronic kidney disease   . Cognitive impairment, mild, so stated 01/23/2014  . Colon polyps   . 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 Hypertension   . Migraines    Past Surgical History:  Procedure Laterality Date  . ABDOMINAL HERNIA REPAIR  2012  . APPENDECTOMY  1974  . left knee  2011   surgery  . VARICOSE VEIN SURGERY      reports that he quit smoking about 18 years ago. His smoking use included cigarettes. He has a 60.00 pack-year smoking history. He has never used smokeless tobacco. He reports that he does not drink alcohol or use drugs. family history includes Cancer in his maternal uncle; Heart disease in his father; Hypertension in his mother; Stroke in his paternal grandfather. No Known Allergies   Review of Systems  Constitutional: Negative for chills, fever and unexpected weight change.  Respiratory: Negative for shortness of breath.   Cardiovascular: Negative for chest pain.  Gastrointestinal: Positive for abdominal distention. Negative for abdominal pain, blood in stool, constipation, nausea and vomiting.  Skin: Negative for rash.  Hematological: Negative for adenopathy.        Objective:   Physical Exam  Constitutional: He appears well-developed and well-nourished.  Cardiovascular: Normal rate and regular rhythm.  Pulmonary/Chest: Effort normal and breath sounds normal.  Abdominal: Soft. Bowel sounds are normal. He exhibits no distension and no mass. There is no tenderness. There is no rebound and no guarding.  Skin: No rash noted.       Assessment:     Patient presents with approximately 1 month history of frequent symptoms of abdominal bloating, increased flatulence, frequent loose stools.  No recent antibiotic use.  Daughter with diagnosis of celiac disease.  Patient has no history of lactose intolerance.  Question small intestinal bacterial overgrowth    Plan:     -Check tissue transglutaminase antibody and CBC with dif. -Consider carbohydrate breath test- will check on local availability -Consider trial of over-the-counter Pepcid 20 mg twice daily  [name] MD [facility] at [facility]

## 2018-01-26 NOTE — Patient Instructions (Signed)
Consider OTC Pepcid 20  Mg twice daily for any reflux symptoms  We will call you regarding lab results.

## 2018-01-28 LAB — TISSUE TRANSGLUTAMINASE ABS,IGG,IGA
(tTG) Ab, IgA: 53 U/mL — ABNORMAL HIGH
(tTG) Ab, IgG: >100 U/mL — ABNORMAL HIGH

## 2018-01-31 ENCOUNTER — Encounter: Payer: Self-pay | Admitting: Family Medicine

## 2018-02-27 ENCOUNTER — Other Ambulatory Visit: Payer: Self-pay | Admitting: Family Medicine

## 2018-02-28 NOTE — Telephone Encounter (Signed)
Last refill 01/10/18 and last office visit 01/26/18 Okay to fill?

## 2018-06-12 ENCOUNTER — Other Ambulatory Visit: Payer: Self-pay | Admitting: Family Medicine

## 2018-06-13 NOTE — Telephone Encounter (Signed)
OK to continue?

## 2018-06-13 NOTE — Telephone Encounter (Signed)
May refill but pt will need follow up by May to repeat PSA and CBC.

## 2018-07-13 ENCOUNTER — Encounter: Payer: Self-pay | Admitting: Family Medicine

## 2018-07-13 ENCOUNTER — Other Ambulatory Visit: Payer: Self-pay

## 2018-07-13 MED ORDER — SERTRALINE HCL 100 MG PO TABS: 100.0000 mg | ORAL_TABLET | Freq: Every day | ORAL | 0 refills | Status: DC

## 2018-07-28 ENCOUNTER — Other Ambulatory Visit: Payer: Self-pay | Admitting: Family Medicine

## 2018-08-19 ENCOUNTER — Ambulatory Visit (INDEPENDENT_AMBULATORY_CARE_PROVIDER_SITE_OTHER): Payer: BLUE CROSS/BLUE SHIELD | Admitting: Family Medicine

## 2018-08-19 ENCOUNTER — Other Ambulatory Visit: Payer: Self-pay

## 2018-08-19 ENCOUNTER — Other Ambulatory Visit: Payer: Self-pay | Admitting: Family Medicine

## 2018-08-19 DIAGNOSIS — I10 Essential (primary) hypertension: Secondary | ICD-10-CM

## 2018-08-19 DIAGNOSIS — R7989 Other specified abnormal findings of blood chemistry: Secondary | ICD-10-CM

## 2018-08-19 MED ORDER — METOPROLOL SUCCINATE ER 50 MG PO TB24: ORAL_TABLET | ORAL | 3 refills | Status: DC

## 2018-08-19 NOTE — Progress Notes (Signed)
Patient ID: Micheal Graham, male   DOB: Jun 22, 1955, 63 y.o.   MRN: [id number]  This visit type was conducted due to national recommendations for restrictions regarding the COVID-19 pandemic in an effort to limit this patient's exposure and mitigate transmission in our community.   Virtual Visit via Video Note  I connected with Micheal Graham on 08/19/18 at 10:45 AM EDT by a video enabled telemedicine application and verified that I am speaking with the correct person using two identifiers.  Location patient: home Location provider:work or home office Persons participating in the virtual visit: patient, provider  I discussed the limitations of evaluation and management by telemedicine and the availability of in person appointments. The patient expressed understanding and agreed to proceed.   HPI: Patient needs refills of Toprol-XL 50 mg.  He has hypertension history and also takes Lotrel.  Blood pressures have been stable.  He came in last fall with increased abdominal bloating.  He had very high tissue transglutaminase antibody levels.  He has been gluten-free since then and abdominal bloating has completely ceased.  Feels well overall.  On topical testosterone replacement.  He is due for follow-up labs with PSA, CBC, and testosterone level.  He has been inconsistent with testosterone use recently.   ROS: See pertinent positives and negatives per HPI.  Past Medical History:  Diagnosis Date  . Allergy   . 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 Chronic kidney disease   . Cognitive impairment, mild, so stated 01/23/2014  . Colon polyps   . 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 Hypertension   . Migraines     Past Surgical History:  Procedure Laterality Date  . ABDOMINAL HERNIA REPAIR  2012  . APPENDECTOMY  1974  . left knee  2011   surgery  . VARICOSE VEIN SURGERY      Family History  Problem Relation Age of Onset  . Hypertension Mother   . Heart disease Father   . Cancer Maternal Uncle         prostate  . Stroke Paternal Grandfather     SOCIAL HX: Quit smoking 2001   Current Outpatient Medications:  .  amLODipine-benazepril (LOTREL) 5-20 MG capsule, TAKE 1 CAPSULE BY MOUTH EVERY DAY, Disp: 90 capsule, Rfl: 0 .  Cholecalciferol (VITAMIN D) 2000 units CAPS, Take by mouth., Disp: , Rfl:  .  diphenhydramine-acetaminophen (TYLENOL PM) 25-500 MG TABS, Take 2 tablets by mouth at bedtime., Disp: , Rfl:  .  fexofenadine (ALLEGRA) 180 MG tablet, Take 180 mg by mouth daily., Disp: , Rfl:  .  metoprolol succinate (TOPROL-XL) 50 MG 24 hr tablet, Take with or immediately following a meal., Disp: 90 tablet, Rfl: 3 .  Prenatal Multivit-Min-Fe-FA (PRENATAL VITAMINS PO), Take by mouth daily., Disp: , Rfl:  .  sertraline (ZOLOFT) 100 MG tablet, Take 1 tablet (100 mg total) by mouth daily., Disp: 90 tablet, Rfl: 0 .  sildenafil (VIAGRA) 100 MG tablet, Take 0.5-1 tablets (50-100 mg total) by mouth daily as needed for erectile dysfunction., Disp: 6 tablet, Rfl: 11 .  Testosterone 20.25 MG/ACT (1.62%) GEL, USE 2 PUMPS ON EACH ARM DAILY, Disp: 150 g, Rfl: 0  EXAM:  VITALS per patient if applicable:  GENERAL: alert, oriented, appears well and in no acute distress  HEENT: atraumatic, conjunttiva clear, no obvious abnormalities on inspection of external nose and ears  NECK: normal movements of the head and neck  LUNGS: on inspection no signs of respiratory distress, breathing rate appears  normal, no obvious gross SOB, gasping or wheezing  CV: no obvious cyanosis  MS: moves all visible extremities without noticeable abnormality  PSYCH/NEURO: pleasant and cooperative, no obvious depression or anxiety, speech and thought processing grossly intact  ASSESSMENT AND PLAN:  Discussed the following assessment and plan:  Essential hypertension-stable -Refill Toprol for 1 year  Low testosterone -Patient will schedule 2 month follow-up and check CBC, testosterone level, and PSA     I  discussed the assessment and treatment plan with the patient. The patient was provided an opportunity to ask questions and all were answered. The patient agreed with the plan and demonstrated an understanding of the instructions.   The patient was advised to call back or seek an in-person evaluation if the symptoms worsen or if the condition fails to improve as anticipated.   Carolann Littler, MD

## 2018-09-07 DIAGNOSIS — C4492 Squamous cell carcinoma of skin, unspecified: Secondary | ICD-10-CM

## 2018-09-07 HISTORY — DX: Squamous cell carcinoma of skin, unspecified: C44.92

## 2018-09-13 ENCOUNTER — Telehealth: Payer: Self-pay

## 2018-09-13 NOTE — Telephone Encounter (Signed)
Pt called in and was very upset that he is being charged for an OV for DOS 08/19/18. He states he was not advised this would be considered an office visit as all he wanted was a refill for his medication. I attempted to explain to the patient that we had not seen him in over a year to address his hypertension, therefore we needed to complete an OV to document continued treatment of his htn and to be able to send in his refills. He is upset that we called him about his refill and then immediately transferred him to Dr. Elease Hashimoto for a phone call and he was not advised this would be an actual visit. I apologized that he was not explained about the process, and that I could not speak to what Jinny Blossom did or did not explain to him but that she most definitely should have done this. I explained per Dr. Erick Blinks notes he did complete a visit with him, and based on that we cannot withdraw the claim to the insurance. Pt then asked for all of his phone numbers to be removed from his chart so we "can never call him again". I explained this was not possible, I could remove one number but we do require a phone number to be listed in the chart. He states he only wants to communicate through My Chart, I advised this was not intended as the sole form of communication between offices and patients. I asked him again which number he would like removed from his chart, he replied that it doesn't matter anymore. He did not want to continue the conversation at this point.   Nothing further needed at this time.

## 2018-10-12 ENCOUNTER — Other Ambulatory Visit: Payer: Self-pay | Admitting: Family Medicine

## 2018-10-12 ENCOUNTER — Encounter: Payer: Self-pay | Admitting: Family Medicine

## 2018-10-17 ENCOUNTER — Ambulatory Visit: Payer: BLUE CROSS/BLUE SHIELD | Admitting: Family Medicine

## 2018-10-25 ENCOUNTER — Encounter: Payer: Self-pay | Admitting: Family Medicine

## 2018-10-25 MED ORDER — TESTOSTERONE 20.25 MG/ACT (1.62%) TD GEL: TRANSDERMAL | 0 refills | Status: DC

## 2018-10-25 NOTE — Telephone Encounter (Signed)
I refilled but he needs some follow up labs.  Will address at August visit.

## 2018-11-07 ENCOUNTER — Other Ambulatory Visit: Payer: Self-pay

## 2018-11-07 ENCOUNTER — Encounter: Payer: Self-pay | Admitting: Family Medicine

## 2018-11-07 ENCOUNTER — Ambulatory Visit (INDEPENDENT_AMBULATORY_CARE_PROVIDER_SITE_OTHER): Payer: BC Managed Care – PPO | Admitting: Family Medicine

## 2018-11-07 VITALS — BP 120/86 | HR 60 | Temp 98.0°F | Ht 72.0 in | Wt 239.8 lb

## 2018-11-07 DIAGNOSIS — I10 Essential (primary) hypertension: Secondary | ICD-10-CM

## 2018-11-07 DIAGNOSIS — K900 Celiac disease: Secondary | ICD-10-CM

## 2018-11-07 DIAGNOSIS — R7989 Other specified abnormal findings of blood chemistry: Secondary | ICD-10-CM | POA: Diagnosis not present

## 2018-11-07 LAB — CBC WITH DIFFERENTIAL/PLATELET
Basophils Absolute: 0.1 10*3/uL (ref 0.0–0.1)
Basophils Relative: 0.8 % (ref 0.0–3.0)
Eosinophils Absolute: 0.2 10*3/uL (ref 0.0–0.7)
Eosinophils Relative: 2.8 % (ref 0.0–5.0)
HCT: 39.3 % (ref 39.0–52.0)
Hemoglobin: 13.5 g/dL (ref 13.0–17.0)
Lymphocytes Relative: 26.1 % (ref 12.0–46.0)
Lymphs Abs: 2 10*3/uL (ref 0.7–4.0)
MCHC: 34.3 g/dL (ref 30.0–36.0)
MCV: 95 fl (ref 78.0–100.0)
Monocytes Absolute: 0.4 10*3/uL (ref 0.1–1.0)
Monocytes Relative: 5.8 % (ref 3.0–12.0)
Neutro Abs: 4.8 10*3/uL (ref 1.4–7.7)
Neutrophils Relative %: 64.5 % (ref 43.0–77.0)
Platelets: 145 10*3/uL — ABNORMAL LOW (ref 150.0–400.0)
RBC: 4.14 Mil/uL — ABNORMAL LOW (ref 4.22–5.81)
RDW: 16.8 % — ABNORMAL HIGH (ref 11.5–15.5)
WBC: 7.5 10*3/uL (ref 4.0–10.5)

## 2018-11-07 LAB — TESTOSTERONE: Testosterone: 166.37 ng/dL — ABNORMAL LOW (ref 300.00–890.00)

## 2018-11-07 LAB — COMPREHENSIVE METABOLIC PANEL
ALT: 25 U/L (ref 0–53)
AST: 18 U/L (ref 0–37)
Albumin: 4 g/dL (ref 3.5–5.2)
Alkaline Phosphatase: 110 U/L (ref 39–117)
BUN: 15 mg/dL (ref 6–23)
CO2: 25 mEq/L (ref 19–32)
Calcium: 8.6 mg/dL (ref 8.4–10.5)
Chloride: 104 mEq/L (ref 96–112)
Creatinine, Ser: 1.46 mg/dL (ref 0.40–1.50)
GFR: 48.48 mL/min — ABNORMAL LOW (ref >60.00)
Glucose, Bld: 124 mg/dL — ABNORMAL HIGH (ref 70–99)
Potassium: 4.2 mEq/L (ref 3.5–5.1)
Sodium: 138 mEq/L (ref 135–145)
Total Bilirubin: 0.3 mg/dL (ref 0.2–1.2)
Total Protein: 6.8 g/dL (ref 6.0–8.3)

## 2018-11-07 LAB — PSA: PSA: 0.29 ng/mL (ref 0.10–4.00)

## 2018-11-07 NOTE — Patient Instructions (Signed)
Set appointment time for a DEXA scan for [address].

## 2018-11-07 NOTE — Progress Notes (Signed)
  Subjective:     Patient ID: Micheal Graham, male   DOB: 1956/02/27, 63 y.o.   MRN: [id number]  HPI Patient is seen for medical follow-up.  He has chronic problems including hypertension, low testosterone, history of kidney stones, celiac disease.  He is gone completely gluten-free and GI symptoms have essentially resolved.  He had very high tissue transglutaminase antibodies.  He is requesting repeat.  He is on topical testosterone but states that in the morning after he use this he frequently perspires heavily because of his work usually within an hour of application.  He wonders if that may impact his levels.  He needs some follow-up labs today.  He has hypertension treated with metoprolol and Lotrel.  Blood pressure stable.  No headaches or dizziness.  No chest pain.  Past Medical History:  Diagnosis Date  . Allergy   . 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 Chronic kidney disease   . Cognitive impairment, mild, so stated 01/23/2014  . Colon polyps   . 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 Hypertension   . Migraines    Past Surgical History:  Procedure Laterality Date  . ABDOMINAL HERNIA REPAIR  2012  . APPENDECTOMY  1974  . left knee  2011   surgery  . VARICOSE VEIN SURGERY      reports that he quit smoking about 19 years ago. His smoking use included cigarettes. He has a 60.00 pack-year smoking history. He has never used smokeless tobacco. He reports that he does not drink alcohol or use drugs. family history includes Cancer in his maternal uncle; Heart disease in his father; Hypertension in his mother; Stroke in his paternal grandfather. No Known Allergies   Review of Systems  Constitutional: Negative for appetite change and unexpected weight change.  Eyes: Negative for visual disturbance.  Respiratory: Negative for cough, chest tightness and shortness of breath.   Cardiovascular: Negative for chest pain, palpitations and leg swelling.  Gastrointestinal: Negative for abdominal pain.   Endocrine: Negative for polydipsia and polyuria.  Neurological: Negative for dizziness, syncope, weakness, light-headedness and headaches.       Objective:   Physical Exam Constitutional:      Appearance: He is well-developed.  HENT:     Right Ear: External ear normal.     Left Ear: External ear normal.  Eyes:     Pupils: Pupils are equal, round, and reactive to light.  Neck:     Musculoskeletal: Neck supple.     Thyroid: No thyromegaly.  Cardiovascular:     Rate and Rhythm: Normal rate and regular rhythm.  Pulmonary:     Effort: Pulmonary effort is normal. No respiratory distress.     Breath sounds: Normal breath sounds. No wheezing or rales.  Neurological:     Mental Status: He is alert and oriented to person, place, and time.        Assessment:     #1 hypertension stable and at goal  #2 celiac disease with improvement in GI symptoms after adoption of gluten-free diet  #3 low testosterone    Plan:     -Check repeat labs with comprehensive metabolic panel, CBC, PSA, testosterone level -Repeat tissue transglutaminase antibodies -Continue gluten-free diet -Patient has questions regarding DEXA scan.  Because of his history of low testosterone this is reasonable.  This will be scheduled  [name] MD [facility] at [facility]

## 2018-11-10 LAB — TISSUE TRANSGLUTAMINASE ABS,IGG,IGA
(tTG) Ab, IgA: 3 U/mL
(tTG) Ab, IgG: 16 U/mL — ABNORMAL HIGH

## 2018-11-14 ENCOUNTER — Other Ambulatory Visit: Payer: Self-pay

## 2018-11-14 ENCOUNTER — Ambulatory Visit (INDEPENDENT_AMBULATORY_CARE_PROVIDER_SITE_OTHER)
Admission: RE | Admit: 2018-11-14 | Discharge: 2018-11-14 | Disposition: A | Discharge status: Home / Self Care | Payer: BC Managed Care – PPO | Source: Ambulatory Visit | Attending: Family Medicine | Admitting: Family Medicine

## 2018-11-14 DIAGNOSIS — R7989 Other specified abnormal findings of blood chemistry: Secondary | ICD-10-CM

## 2018-11-20 DIAGNOSIS — R7989 Other specified abnormal findings of blood chemistry: Secondary | ICD-10-CM | POA: Diagnosis not present

## 2019-01-02 ENCOUNTER — Other Ambulatory Visit: Payer: Self-pay | Admitting: Family Medicine

## 2019-01-05 NOTE — Telephone Encounter (Signed)
Routing to PCP for approval.

## 2019-01-06 NOTE — Telephone Encounter (Signed)
Refill for 1 year 

## 2019-01-13 ENCOUNTER — Other Ambulatory Visit: Payer: Self-pay

## 2019-01-13 ENCOUNTER — Ambulatory Visit: Payer: BC Managed Care – PPO | Admitting: Family Medicine

## 2019-01-13 ENCOUNTER — Encounter: Payer: Self-pay | Admitting: Family Medicine

## 2019-01-13 VITALS — BP 134/82 | HR 65 | Temp 98.2°F | Resp 17 | Ht 72.0 in | Wt 211.5 lb

## 2019-01-13 DIAGNOSIS — M8580 Other specified disorders of bone density and structure, unspecified site: Secondary | ICD-10-CM

## 2019-01-13 DIAGNOSIS — R7989 Other specified abnormal findings of blood chemistry: Secondary | ICD-10-CM

## 2019-01-13 DIAGNOSIS — Z23 Encounter for immunization: Secondary | ICD-10-CM

## 2019-01-13 NOTE — Progress Notes (Signed)
Subjective:     Patient ID: Micheal Graham, male   DOB: 1956-03-25, 63 y.o.   MRN: [id number]  HPI Micheal Graham has some questions to discuss regarding recent DEXA scan and recent labs.  He also needs flu vaccine and Pneumovax.  He has low testosterone.  We order DEXA scan because of that.  There was a comment of "L4 was excluded due to degenerative changes ".  He wanted further clarification regarding what this meant.  His DEXA scan came back with a score of -1.3 right femoral neck.  He currently does not take any regular calcium or vitamin D.  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  He does not have any consistent pain with other activities.  No history of peripheral vascular disease.  He has hypertension and celiac disease.  Recent celiac antibodies had declined significantly from a year ago after elimination of gluten's.  Past Medical History:  Diagnosis Date  . Allergy   . 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 Chronic kidney disease   . Cognitive impairment, mild, so stated 01/23/2014  . Colon polyps   . 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 Hypertension   . Migraines    Past Surgical History:  Procedure Laterality Date  . ABDOMINAL HERNIA REPAIR  2012  . APPENDECTOMY  1974  . left knee  2011   surgery  . VARICOSE VEIN SURGERY      reports that he quit smoking about 19 years ago. His smoking use included cigarettes. He has a 60.00 pack-year smoking history. He has never used smokeless tobacco. He reports that he does not drink alcohol or use drugs. family history includes Cancer in his maternal uncle; Heart disease in his father; Hypertension in his mother; Stroke in his paternal grandfather. No Known Allergies   Review of Systems  Constitutional: Negative for fatigue and unexpected weight change.  Eyes: Negative for visual disturbance.  Respiratory: Negative for cough, chest tightness and shortness of breath.   Cardiovascular: Negative for chest pain, palpitations and  leg swelling.  Musculoskeletal: Positive for arthralgias.  Neurological: Negative for dizziness, syncope, weakness, light-headedness and headaches.       Objective:   Physical Exam Constitutional:      Appearance: He is well-developed.  HENT:     Right Ear: External ear normal.     Left Ear: External ear normal.  Eyes:     Pupils: Pupils are equal, round, and reactive to light.  Neck:     Musculoskeletal: Neck supple.     Thyroid: No thyromegaly.  Cardiovascular:     Rate and Rhythm: Normal rate and regular rhythm.  Pulmonary:     Effort: Pulmonary effort is normal. No respiratory distress.     Breath sounds: Normal breath sounds. No wheezing or rales.  Neurological:     Mental Status: He is alert and oriented to person, place, and time.     Comments: No focal strength deficits.  Reflexes 2+ patellar and 1+ Achilles bilaterally        Assessment:     #1 osteopenia by recent DEXA scan.  Major risk factor is low testosterone  #2 hypertension stable  #3 low testosterone.  Recent CBC and PSA unremarkable    Plan:     -Discussed recommended amounts of calcium and vitamin D daily -Consider repeat DEXA in 2 years -Flu vaccine and Pneumovax given -We went over his DEXA scan in some detail.  We described that degenerative changes  in the lumbar spine can sometimes falsely elevate scores and that we go with the lowest overall score which was -1.3 in the femur.  Micheal Post MD [facility] at [facility]

## 2019-01-13 NOTE — Patient Instructions (Signed)
Take Vit D 1,000 IU daily and Calcium 1,000 mg daily.

## 2019-01-17 ENCOUNTER — Encounter: Payer: Self-pay | Admitting: Family Medicine

## 2019-01-31 ENCOUNTER — Other Ambulatory Visit: Payer: Self-pay | Admitting: Family Medicine

## 2019-02-01 MED ORDER — TESTOSTERONE 20.25 MG/ACT (1.62%) TD GEL: TRANSDERMAL | 5 refills | Status: DC

## 2019-02-27 DIAGNOSIS — C44311 Basal cell carcinoma of skin of nose: Secondary | ICD-10-CM | POA: Insufficient documentation

## 2019-05-09 ENCOUNTER — Other Ambulatory Visit: Payer: Self-pay | Admitting: Family Medicine

## 2019-05-23 ENCOUNTER — Other Ambulatory Visit: Payer: Self-pay

## 2019-05-23 ENCOUNTER — Telehealth: Payer: Self-pay | Admitting: Family Medicine

## 2019-05-23 ENCOUNTER — Ambulatory Visit (INDEPENDENT_AMBULATORY_CARE_PROVIDER_SITE_OTHER): Payer: BC Managed Care – PPO | Admitting: Family Medicine

## 2019-05-23 ENCOUNTER — Encounter: Payer: Self-pay | Admitting: Family Medicine

## 2019-05-23 VITALS — BP 128/82 | HR 72 | Temp 98.1°F | Ht 72.0 in | Wt 243.3 lb

## 2019-05-23 DIAGNOSIS — M25561 Pain in right knee: Secondary | ICD-10-CM | POA: Diagnosis not present

## 2019-05-23 MED ORDER — SILDENAFIL CITRATE 100 MG PO TABS: 50.0000 mg | ORAL_TABLET | Freq: Every day | ORAL | 11 refills | Status: DC | PRN

## 2019-05-23 MED ORDER — DICLOFENAC SODIUM 1 % EX GEL: 4.0000 g | Freq: Four times a day (QID) | CUTANEOUS | 1 refills | Status: DC

## 2019-05-23 NOTE — Telephone Encounter (Signed)
Please see message. °

## 2019-05-23 NOTE — Telephone Encounter (Signed)
Pt wife called and said that the gel he recommended he has already tried and it did not work. She wants to know if Dr. Elease Hashimoto would like him to try it again.  Patient Phone: [phone]

## 2019-05-23 NOTE — Telephone Encounter (Signed)
I would but if not helping in several days we could try a NSAID such as Meloxicam.

## 2019-05-23 NOTE — Telephone Encounter (Signed)
Called patient and he stated that he needs a prior authorization for the Voltaren Gel. I advised that this can be purchased over the counter and they can pull up a discount card or coupon to assist with cost. Patient verbalized an understanding.

## 2019-05-23 NOTE — Progress Notes (Signed)
Acute Office Visit  Subjective:    Patient ID: Micheal Graham, male    DOB: Apr 13, 1955, 64 y.o.   MRN: [id number]  Chief Complaint  Patient presents with  . Knee Pain    Right knee pain, started about 2 weeks ago, getting worse, hurts all the time    HPI Patient is in today for right knee pain x 2 weeks. Denies trauma or injury. States pain improved after using a topical gel for a few days but then returned at higher intensity. He denies that any activity or movement makes pain worse. Hx osteoarthritis and reports bilateral knees are "bone on bone." Hx left knee meniscus repair.   Past Medical History:  Diagnosis Date  . Allergy   . 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 Chronic kidney disease   . Cognitive impairment, mild, so stated 01/23/2014  . Colon polyps   . 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 Hypertension   . Migraines     Past Surgical History:  Procedure Laterality Date  . ABDOMINAL HERNIA REPAIR  2012  . APPENDECTOMY  1974  . left knee  2011   surgery  . VARICOSE VEIN SURGERY      Family History  Problem Relation Age of Onset  . Hypertension Mother   . Heart disease Father   . Cancer Maternal Uncle        prostate  . Stroke Paternal Grandfather     Social History   Socioeconomic History  . Marital status: Single    Spouse name: Not on file  . Number of children: Not on file  . Years of education: Not on file  . Highest education level: Not on file  Occupational History  . Not on file  Tobacco Use  . Smoking status: Former Smoker    Packs/day: 2.00    Years: 30.00    Pack years: 60.00    Types: Cigarettes    Quit date: 09/09/1999    Years since quitting: 19.7  . Smokeless tobacco: Never Used  Substance and Sexual Activity  . Alcohol use: No    Alcohol/week: 0.0 standard drinks    Comment: occasional beer  . Drug use: No  . Sexual activity: Not on file  Other Topics Concern  . Not on file  Social History Narrative   Right handed, caffeine 2  cups daily, FT -truck driver, Married, 2 kids, HS graduate   Social Determinants of Health   Financial Resource Strain:   . Difficulty of Paying Living Expenses: Not on file  Food Insecurity:   . Worried About Charity fundraiser in the Last Year: Not on file  . Ran Out of Food in the Last Year: Not on file  Transportation Needs:   . Lack of Transportation (Medical): Not on file  . Lack of Transportation (Non-Medical): Not on file  Physical Activity:   . Days of Exercise per Week: Not on file  . Minutes of Exercise per Session: Not on file  Stress:   . Feeling of Stress : Not on file  Social Connections:   . Frequency of Communication with Friends and Family: Not on file  . Frequency of Social Gatherings with Friends and Family: Not on file  . Attends Religious Services: Not on file  . Active Member of Clubs or Organizations: Not on file  . Attends Archivist Meetings: Not on file  . Marital Status: Not on file  Intimate Partner Violence:   .  Fear of Current or Ex-Partner: Not on file  . Emotionally Abused: Not on file  . Physically Abused: Not on file  . Sexually Abused: Not on file    Outpatient Medications Prior to Visit  Medication Sig Dispense Refill  . amLODipine-benazepril (LOTREL) 5-20 MG capsule TAKE 1 CAPSULE BY MOUTH EVERY DAY 90 capsule 1  . Cholecalciferol (VITAMIN D) 2000 units CAPS Take by mouth.    . diphenhydrAMINE HCl, Sleep, (UNISOM SLEEPGELS) 50 MG CAPS     . fexofenadine (ALLEGRA) 180 MG tablet Take 180 mg by mouth daily.    . metoprolol succinate (TOPROL-XL) 50 MG 24 hr tablet Take with or immediately following a meal. 90 tablet 3  . Prenatal Multivit-Min-Fe-FA (PRENATAL VITAMINS PO) Take by mouth daily.    . sertraline (ZOLOFT) 100 MG tablet TAKE 1 TABLET BY MOUTH EVERY DAY 90 tablet 3  . sildenafil (VIAGRA) 100 MG tablet Take 0.5-1 tablets (50-100 mg total) by mouth daily as needed for erectile dysfunction. 6 tablet 11  . Testosterone 20.25  MG/ACT (1.62%) GEL Use 2 pumps on each arm daily 150 g 5  . diphenhydramine-acetaminophen (TYLENOL PM) 25-500 MG TABS Take 2 tablets by mouth at bedtime.     No facility-administered medications prior to visit.    No Known Allergies  Review of Systems  GEN: Well nourished, well developed, in no acute distress, Alert and interactive HEENT: normocephalic, atraumatic. PERRL, EOMI, good conjugate gaze. Nares symmetric, patent. Moist mucous membranes Neck: Supple, no JVD or masses. Normal ROM Cardiac: pulse regular; no edema  Respiratory: normal work of breathing GI: soft, nondistended MS: Strength and sensation intact, cap refill <2sec, 2+ distal pulses bilaterally. Right knee slightly warmer to touch and mildly swollen when compared to left. No effusion noted. Patella not tender to touch. Right knee with normal ROM. Skin: Intact, warm and dry, no rashes, no lesions, no erythema Neuro:  Alert and Oriented x 3, Cranial nerves II to XII intact, Reflex symmetric, Sensation intact, follows commands, gait normal Psych: euthymic mood, full affect     Objective:    Physical Exam  BP 128/82   Pulse 72   Temp 98.1 F (36.7 C) (Temporal)   Ht 6' (1.829 m)   Wt 243 lb 4.8 oz (110.4 kg)   SpO2 96%   BMI 33.00 kg/m  Wt Readings from Last 3 Encounters:  05/23/19 243 lb 4.8 oz (110.4 kg)  01/13/19 211 lb 8 oz (95.9 kg)  11/07/18 239 lb 12.8 oz (108.8 kg)    Health Maintenance Due  Topic Date Due  . Hepatitis C Screening  06/29/1955  . HIV Screening  01/10/1969    There are no preventive care reminders to display for this patient.   Lab Results  Component Value Date   TSH 1.79 11/12/2016   Lab Results  Component Value Date   WBC 7.5 11/07/2018   HGB 13.5 11/07/2018   HCT 39.3 11/07/2018   MCV 95.0 11/07/2018   PLT 145.0 (L) 11/07/2018   Lab Results  Component Value Date   NA 138 11/07/2018   K 4.2 11/07/2018   CO2 25 11/07/2018   GLUCOSE 124 (H) 11/07/2018   BUN 15  11/07/2018   CREATININE 1.46 11/07/2018   BILITOT 0.3 11/07/2018   ALKPHOS 110 11/07/2018   AST 18 11/07/2018   ALT 25 11/07/2018   PROT 6.8 11/07/2018   ALBUMIN 4.0 11/07/2018   CALCIUM 8.6 11/07/2018   ANIONGAP 10 08/28/2017   GFR 48.48 (L)  11/07/2018   Lab Results  Component Value Date   CHOL 199 06/12/2015   Lab Results  Component Value Date   HDL 34.10 (L) 06/12/2015   No results found for: [facility] Lab Results  Component Value Date   TRIG 271.0 (H) 06/12/2015   Lab Results  Component Value Date   CHOLHDL 6 06/12/2015   Lab Results  Component Value Date   HGBA1C 5.9 11/30/2016       Assessment & Plan:   Problem List Items Addressed This Visit    None       Meds ordered this encounter  Medications  . diclofenac Sodium (VOLTAREN) 1 % GEL    Sig: Apply 4 g topically 4 (four) times daily.    Dispense:  100 g    Refill:  1   ASSESSMENT: Right knee without obvious deformity or injury. Will treat for osteoarthritis.    PLAN: Right knee pain. Advised patient to use Diclofenac 1% gel 4 times per day for pain and continue to monitor. Advised he may use ice for 15-20 minutes several times per day. Advised him to call back if symptoms do not improve in a few weeks.   Emmaline Life, RN   Agree with assessment above.  He has known osteoarthritis and presents now with 2-week history of pain without recent injury.  Possibly some very mild warmth but no evidence for acute inflammatory arthritis.  Suspect either exacerbation of osteoarthritis or possible small meniscal injury.  We will start with some diclofenac gel and if is not getting improvement with that over several days could consider trial of nonsteroidal such as meloxicam.  If pain worsening or persist consider sports medicine referral to further assess  [name] MD [facility] at [facility] [place] [facility]

## 2019-05-23 NOTE — Patient Instructions (Signed)
Diclofenac skin gel What is this medicine? DICLOFENAC (dye KLOE fen ak) is a non-steroidal anti-inflammatory drug (NSAID). The 1% skin gel is used to treat osteoarthritis of the hands or knees. The 3% skin gel is used to treat actinic keratosis. This medicine may be used for other purposes; ask your health care provider or pharmacist if you have questions. COMMON BRAND NAME(S): DSG Pak, Omeca, Solaravix, Solaraze, Voltaren Arthritis, Voltaren Gel What should I tell my health care provider before I take this medicine? They need to know if you have any of these conditions:  asthma  bleeding problems  coronary artery bypass graft (CABG) surgery within the past 2 weeks  heart disease  high blood pressure  if you frequently drink alcohol containing drinks  kidney disease  liver disease  open or infected skin  stomach problems  an unusual or allergic reaction to diclofenac, aspirin, other NSAIDs, other medicines, benzyl alcohol (3% gel only), foods, dyes, or preservatives  pregnant or trying to get pregnant  breast-feeding How should I use this medicine? This medicine is for external use only. Follow the directions on the prescription label. Wash hands before and after use. Do not get this medicine in your eyes. If you do, rinse out with plenty of cool tap water. Use your doses at regular intervals. Do not use your medicine more often than directed. A special MedGuide will be given to you by the pharmacist with each prescription and refill of the 1% gel. Be sure to read this information carefully each time. Talk to your pediatrician regarding the use of this medicine in children. Special care may be needed. The 3% gel is not approved for use in children. Overdosage: If you think you have taken too much of this medicine contact a poison control center or emergency room at once. NOTE: This medicine is only for you. Do not share this medicine with others. What if I miss a dose? If you  miss a dose, use it as soon as you can. If it is almost time for your next dose, use only that dose. Do not use double or extra doses. What may interact with this medicine?  aspirin  NSAIDs, medicines for pain and inflammation, like ibuprofen or naproxen Do not use any other skin products without telling your doctor or health care professional. This list may not describe all possible interactions. Give your health care provider a list of all the medicines, herbs, non-prescription drugs, or dietary supplements you use. Also tell them if you smoke, drink alcohol, or use illegal drugs. Some items may interact with your medicine. What should I watch for while using this medicine? Tell your doctor or healthcare provider if your symptoms do not start to get better or if they get worse. You will need to follow up with your healthcare provider to monitor your progress. You may need to be treated for up to 3 months if you are using the 3% gel, but the full effect may not occur until 1 month after stopping treatment. If you develop a severe skin reaction, contact your doctor or healthcare provider immediately. This medicine may cause serious skin reactions. They can happen weeks to months after starting the medicine. Contact your healthcare provider right away if you notice fevers or flu-like symptoms with a rash. The rash may be red or purple and then turn into blisters or peeling of the skin. Or, you might notice a red rash with swelling of the face, lips or lymph nodes in  your neck or under your arms. This medicine can make you more sensitive to the sun. Keep out of the sun. If you cannot avoid being in the sun, wear protective clothing and use sunscreen. Do not use sun lamps or tanning beds/booths. Do not take medicines such as ibuprofen and naproxen with this medicine. Side effects such as stomach upset, nausea, or ulcers may be more likely to occur. Many medicines available without a prescription should not  be taken with this medicine. This medicine does not prevent heart attack or stroke. In fact, this medicine may increase the chance of a heart attack or stroke. The chance may increase with longer use of this medicine and in people who have heart disease. If you take aspirin to prevent heart attack or stroke, talk with your doctor or healthcare provider. This medicine can cause ulcers and bleeding in the stomach and intestines at any time during treatment. Do not smoke cigarettes or drink alcohol. These increase irritation to your stomach and can make it more susceptible to damage from this medicine. Ulcers and bleeding can happen without warning symptoms and can cause death. You may get drowsy or dizzy. Do not drive, use machinery, or do anything that needs mental alertness until you know how this medicine affects you. Do not stand or sit up quickly, especially if you are an older patient. This reduces the risk of dizzy or fainting spells. This medicine can cause you to bleed more easily. Try to avoid damage to your teeth and gums when you brush or floss your teeth. What side effects may I notice from receiving this medicine? Side effects that you should report to your doctor or health care professional as soon as possible:  allergic reactions like skin rash, itching or hives, swelling of the face, lips, or tongue  black or bloody stools, blood in the urine or vomit  blurred vision  chest pain  difficulty breathing or wheezing  nausea or vomiting  rash, fever, and swollen lymph nodes  redness, blistering, peeling or loosening of the skin, including inside the mouth  slurred speech or weakness on one side of the body  trouble passing urine or change in the amount of urine  unexplained weight gain or swelling  unusually weak or tired  yellowing of eyes or skin Side effects that usually do not require medical attention (report to your doctor or health care professional if they continue  or are bothersome):  dizziness  dry skin  headache  heartburn  increased sensitivity to the sun  stomach pain  tingling at the application site This list may not describe all possible side effects. Call your doctor for medical advice about side effects. You may report side effects to FDA at 1-800-FDA-1088. Where should I keep my medicine? Keep out of the reach of children. Store the 1% gel at room temperature between 15 and 30 degrees C (59 and 86 degrees F). Store the 3% gel at room temperature between 20 and 25 degrees C (68 and 77 degrees F). Protect from light. Throw away any unused medicine after the expiration date. NOTE: This sheet is a summary. It may not cover all possible information. If you have questions about this medicine, talk to your doctor, pharmacist, or health care provider.  2020 Elsevier/Gold Standard (2018-06-08 13:05:18)   Osteoarthritis  Osteoarthritis is a type of arthritis that affects tissue that covers the ends of bones in joints (cartilage). Cartilage acts as a cushion between the bones and helps them  move smoothly. Osteoarthritis results when cartilage in the joints gets worn down. Osteoarthritis is sometimes called "wear and tear" arthritis. Osteoarthritis is the most common form of arthritis. It often occurs in older people. It is a condition that gets worse over time (a progressive condition). Joints that are most often affected by this condition are in:  Fingers.  Toes.  Hips.  Knees.  Spine, including neck and lower back. What are the causes? This condition is caused by age-related wearing down of cartilage that covers the ends of bones. What increases the risk? The following factors may make you more likely to develop this condition:  Older age.  Being overweight or obese.  Overuse of joints, such as in athletes.  Past injury of a joint.  Past surgery on a joint.  Family history of osteoarthritis. What are the signs or  symptoms? The main symptoms of this condition are pain, swelling, and stiffness in the joint. The joint may lose its shape over time. Small pieces of bone or cartilage may break off and float inside of the joint, which may cause more pain and damage to the joint. Small deposits of bone (osteophytes) may grow on the edges of the joint. Other symptoms may include:  A grating or scraping feeling inside the joint when you move it.  Popping or creaking sounds when you move. Symptoms may affect one or more joints. Osteoarthritis in a major joint, such as your knee or hip, can make it painful to walk or exercise. If you have osteoarthritis in your hands, you might not be able to grip items, twist your hand, or control small movements of your hands and fingers (fine motor skills). How is this diagnosed? This condition may be diagnosed based on:  Your medical history.  A physical exam.  Your symptoms.  X-rays of the affected joint(s).  Blood tests to rule out other types of arthritis. How is this treated? There is no cure for this condition, but treatment can help to control pain and improve joint function. Treatment plans may include:  A prescribed exercise program that allows for rest and joint relief. You may work with a physical therapist.  A weight control plan.  Pain relief techniques, such as: ? Applying heat and cold to the joint. ? Electric pulses delivered to nerve endings under the skin (transcutaneous electrical nerve stimulation, or TENS). ? Massage. ? Certain nutritional supplements.  NSAIDs or prescription medicines to help relieve pain.  Medicine to help relieve pain and inflammation (corticosteroids). This can be given by mouth (orally) or as an injection.  Assistive devices, such as a brace, wrap, splint, specialized glove, or cane.  Surgery, such as: ? An osteotomy. This is done to reposition the bones and relieve pain or to remove loose pieces of bone and  cartilage. ? Joint replacement surgery. You may need this surgery if you have very bad (advanced) osteoarthritis. Follow these instructions at home: Activity  Rest your affected joints as directed by your health care provider.  Do not drive or use heavy machinery while taking prescription pain medicine.  Exercise as directed. Your health care provider or physical therapist may recommend specific types of exercise, such as: ? Strengthening exercises. These are done to strengthen the muscles that support joints that are affected by arthritis. They can be performed with weights or with exercise bands to add resistance. ? Aerobic activities. These are exercises, such as brisk walking or water aerobics, that get your heart pumping. ? Range-of-motion activities.  These keep your joints easy to move. ? Balance and agility exercises. Managing pain, stiffness, and swelling      If directed, apply heat to the affected area as often as told by your health care provider. Use the heat source that your health care provider recommends, such as a moist heat pack or a heating pad. ? If you have a removable assistive device, remove it as told by your health care provider. ? Place a towel between your skin and the heat source. If your health care provider tells you to keep the assistive device on while you apply heat, place a towel between the assistive device and the heat source. ? Leave the heat on for 20-30 minutes. ? Remove the heat if your skin turns bright red. This is especially important if you are unable to feel pain, heat, or cold. You may have a greater risk of getting burned.  If directed, put ice on the affected joint: ? If you have a removable assistive device, remove it as told by your health care provider. ? Put ice in a plastic bag. ? Place a towel between your skin and the bag. If your health care provider tells you to keep the assistive device on during icing, place a towel between the  assistive device and the bag. ? Leave the ice on for 20 minutes, 2-3 times a day. General instructions  Take over-the-counter and prescription medicines only as told by your health care provider.  Maintain a healthy weight. Follow instructions from your health care provider for weight control. These may include dietary restrictions.  Do not use any products that contain nicotine or tobacco, such as cigarettes and e-cigarettes. These can delay bone healing. If you need help quitting, ask your health care provider.  Use assistive devices as directed by your health care provider.  Keep all follow-up visits as told by your health care provider. This is important. Where to find more information  [employer] of Arthritis and Musculoskeletal and Skin Diseases: www.niams.SouthExposed.es  [employer] on Aging: http://kim-miller.com/  American College of Rheumatology: www.rheumatology.org Contact a health care provider if:  Your skin turns red.  You develop a rash.  You have pain that gets worse.  You have a fever along with joint or muscle aches. Get help right away if:  You lose a lot of weight.  You suddenly lose your appetite.  You have night sweats. Summary  Osteoarthritis is a type of arthritis that affects tissue covering the ends of bones in joints (cartilage).  This condition is caused by age-related wearing down of cartilage that covers the ends of bones.  The main symptom of this condition is pain, swelling, and stiffness in the joint.  There is no cure for this condition, but treatment can help to control pain and improve joint function. This information is not intended to replace advice given to you by your health care provider. Make sure you discuss any questions you have with your health care provider. Document Revised: 03/05/2017 Document Reviewed: 11/25/2015 Elsevier Patient Education  2020 [employer].

## 2019-05-29 ENCOUNTER — Ambulatory Visit: Payer: BC Managed Care – PPO | Admitting: Family Medicine

## 2019-06-14 NOTE — Telephone Encounter (Signed)
[facility]  Diclofenac Sodium 1% Ex Gel  Has been approved 06/09/19 - 06/08/20  Patient is aware

## 2019-06-19 ENCOUNTER — Other Ambulatory Visit: Payer: Self-pay

## 2019-06-19 ENCOUNTER — Encounter: Payer: Self-pay | Admitting: Dermatology

## 2019-06-19 ENCOUNTER — Ambulatory Visit: Payer: BC Managed Care – PPO | Admitting: Dermatology

## 2019-06-19 DIAGNOSIS — C4491 Basal cell carcinoma of skin, unspecified: Secondary | ICD-10-CM

## 2019-06-19 DIAGNOSIS — L821 Other seborrheic keratosis: Secondary | ICD-10-CM

## 2019-06-19 DIAGNOSIS — L570 Actinic keratosis: Secondary | ICD-10-CM | POA: Diagnosis not present

## 2019-06-19 DIAGNOSIS — D492 Neoplasm of unspecified behavior of bone, soft tissue, and skin: Secondary | ICD-10-CM

## 2019-06-19 DIAGNOSIS — C44319 Basal cell carcinoma of skin of other parts of face: Secondary | ICD-10-CM

## 2019-06-19 DIAGNOSIS — Z1283 Encounter for screening for malignant neoplasm of skin: Secondary | ICD-10-CM

## 2019-06-19 HISTORY — DX: Basal cell carcinoma of skin, unspecified: C44.91

## 2019-06-19 NOTE — Progress Notes (Addendum)
   Follow-Up Visit   Subjective  Micheal Graham is a 64 y.o. male who presents for the following: Annual Exam (lots rough spots on arms and face, legs).  The following portions of the chart were reviewed this encounter and updated as appropriate:     Review of Systems: No other skin or systemic complaints.  Objective  Well appearing patient in no apparent distress; mood and affect are within normal limits.  All skin waist up examined. And legs examined.  Objective  Left Zygomatic Area (2): Erythematous patches with gritty scale.  Objective  Below Right Ear: 12 mm pearly papule with arborizing vessels.      Assessment & Plan  AK (actinic keratosis) (2) Left Zygomatic Area  Destruction of lesion - Left Zygomatic Area  Destruction method: cryotherapy   Informed consent: discussed and consent obtained   Lesion destroyed using liquid nitrogen: Yes   Region frozen until ice ball extended beyond lesion: Yes   Outcome: patient tolerated procedure well with no complications    Neoplasm of skin Below Right Ear  Skin / nail biopsy Type of biopsy: tangential   Informed consent: discussed and consent obtained   Timeout: patient name, date of birth, surgical site, and procedure verified   Procedure prep:  Patient was prepped and draped in usual sterile fashion Prep type:  Chlorhexidine Anesthesia: the lesion was anesthetized in a standard fashion   Anesthetic:  1% lidocaine w/ epinephrine 1-100,000 local infiltration Instrument used: flexible razor blade   Hemostasis achieved with: ferric subsulfate   Outcome: patient tolerated procedure well   Post-procedure details: wound care instructions given    Specimen 1 - Surgical pathology Differential Diagnosis: BCC vs SCC Check Margins: No  Tangential biopsy   Legs plus all skin waist up examined; no atypical moles no recurrent skin cancer.  New ulcerated pearly 1 cm nodule below right ear lobule; basal cell carcinoma, shave  biopsy obtained.  Patient will contact me next week for biopsy result.  100s of clinically benign keratoses; brown seborrheic keratoses on the back, white benign keratoses on arms and legs, almost confluent acro keratoses on the feet.  He is not concerned about the aesthetic appearance, and there is no topical therapy to help with these so we will leave them if they are stable.  Two sun exposed keratoses left cheek get traumatized while shaving; these were treated with liquid nitrogen cryotherapy.

## 2019-06-19 NOTE — Patient Instructions (Signed)
  Legs plus all skin waist up examined; no atypical moles no recurrent skin cancer.  New ulcerated pearly 1 cm nodule below right ear lobule; basal cell carcinoma, shave biopsy obtained.  Patient will contact me next week for biopsy result.  100s of clinically benign keratoses; brown seborrheic keratoses on the back, white benign keratoses on arms and legs, almost confluent acro keratoses on the feet.  He is not concerned about the aesthetic appearance, and there is no topical therapy to help with these so we will leave them if they are stable.  To sun exposed keratoses left cheek get traumatized while shaving; these were treated with liquid nitrogen cryotherapy.  Biopsy, Surgery (Curettage) & Surgery (Excision) Aftercare Instructions  1. Okay to remove bandage in 24 hours  2. Wash area with soap and water  3. Apply Vaseline to area twice daily until healed (Not Neosporin)  4. Okay to cover with a Band-Aid to decrease the chance of infection or prevent irritation from clothing; also it's okay to uncover lesion at home.  5. Suture instructions: return to our office in 7-10 or 10-14 days for a nurse visit for suture removal. Variable healing with sutures, if pain or itching occurs call our office. It's okay to shower or bathe 24 hours after sutures are given.  6. The following risks may occur after a biopsy, curettage or excision: bleeding, scarring, discoloration, recurrence, infection (redness, yellow drainage, pain or swelling).  7. For questions, concerns and results call our office at [place] before 4pm & Friday before 3pm. Biopsy results will be available in 1 week.

## 2019-06-23 ENCOUNTER — Telehealth: Payer: Self-pay

## 2019-06-23 NOTE — Telephone Encounter (Signed)
-----   Message from [name], MD sent at 06/22/2019  3:49 PM EDT ----- Schedule surgery with Dr. Darene Lamer

## 2019-06-23 NOTE — Telephone Encounter (Signed)
Phone call to patient and path report given.  30mnute surgery scheduled with Dr. TDenna Haggardon 07/20/2019 at 8:30am.

## 2019-07-01 NOTE — Addendum Note (Signed)
Addended by: [name] on: 07/01/2019 10:26 PM   Modules accepted: Orders

## 2019-07-20 ENCOUNTER — Encounter: Payer: BC Managed Care – PPO | Admitting: Dermatology

## 2019-07-25 ENCOUNTER — Encounter: Payer: BC Managed Care – PPO | Admitting: Family Medicine

## 2019-07-26 ENCOUNTER — Encounter: Payer: Self-pay | Admitting: *Deleted

## 2019-07-27 ENCOUNTER — Other Ambulatory Visit: Payer: Self-pay

## 2019-07-27 ENCOUNTER — Ambulatory Visit (INDEPENDENT_AMBULATORY_CARE_PROVIDER_SITE_OTHER): Payer: BC Managed Care – PPO | Admitting: Dermatology

## 2019-07-27 ENCOUNTER — Encounter: Payer: Self-pay | Admitting: Dermatology

## 2019-07-27 DIAGNOSIS — C44319 Basal cell carcinoma of skin of other parts of face: Secondary | ICD-10-CM | POA: Diagnosis not present

## 2019-07-27 DIAGNOSIS — C4441 Basal cell carcinoma of skin of scalp and neck: Secondary | ICD-10-CM

## 2019-07-27 NOTE — Patient Instructions (Addendum)
Wound Care Instructions  On the day following your surgery, you should begin doing daily dressing changes: 1. Remove the old dressing and discard it. 2. Cleanse the wound gently with tap water. This may be done in the shower or by placing a wet gauze pad directly on the wound and letting it soak for several minutes. 3. It is important to gently remove any dried blood from the wound in order to encourage healing. This may be done by gently rolling a moistened Q-tip on the dried blood. Do not pick at the wound. 4. If the wound should start to bleed, continue cleaning the wound, then place a moist gauze pad on the wound and hold pressure for a few minutes.  5. Make sure you then dry the skin surrounding the wound completely or the tape will not stick to the skin. Do not use cotton balls on the wound. 6. After the wound is clean and dry, apply the ointment gently with a Q-tip. 7. Cut a non-stick pad to fit the size of the wound. Lay the pad flush to the wound. If the wound is draining, you may want to reinforce it with a small amount of gauze on top of the non-stick pad for a little added compression to the area. 8. Use the tape to seal the area completely. 9. Select from the following with respect to your individual situation: 1. If your wound has been stitched closed: continue the above steps 1-8 at least daily until your sutures are removed. 2. If your wound has been left open to heal: continue steps 1-8 at least daily for the first 3-4 weeks. 10. We would like for you to take a few extra precautions for at least the next week. 1. Sleep with your head elevated on pillows if our wound is on your head. 2. Do not bend over or lift heavy items to reduce the chance of elevated blood pressure to the wound 3. Do not participate in particularly strenuous activities.   Below is a list of dressing supplies you might need.  . Cotton-tipped applicators - Q-tips . Gauze pads (2x2 and/or 4x4) - All-Purpose  Sponges . Non-stick dressing material - Telfa . Tape - Paper or Hypafix . New and clean tube of petroleum jelly - Vaseline    Comments on Post-Operative Period 1. Slight swelling and redness often appear around the wound. This is normal and will disappear within several days following the surgery. 2. The healing wound will drain a brownish-red-yellow discharge during healing. This is a normal phase of wound healing. As the wound begins to heal, the drainage may increase in amount. Again, this drainage is normal. 3. Notify us if the drainage becomes persistently bloody, excessively swollen, or intensely painful or develops a foul odor or red streaks.  4. If you should experience mild discomfort during the healing phase, you may take an aspirin-Lorien Shingler medication such as Tylenol (acetaminophen). Notify us if the discomfort is severe or persistent. Avoid alcoholic beverages when taking pain medicine.  In Case of Wound Hemorrhage A wound hemorrhage is when the bandage suddenly becomes soaked with bright red blood and flows profusely. If this happens, sit down or lie down with your head elevated. If the wound has a dressing on it, do not remove the dressing. Apply pressure to the existing gauze. If the wound is not covered, use a gauze pad to apply pressure and continue applying the pressure for 20 minutes without peeking. DO NOT COVER THE WOUND WITH   A LARGE TOWEL OR WASH CLOTH. Release your hand from the wound site but do not remove the dressing. If the bleeding has stopped, gently clean around the wound. Leave the dressing in place for 24 hours if possible. This wait time allows the blood vessels to close off so that you do not spark a new round of bleeding by disrupting the newly clotted blood vessels with an immediate dressing change. If the bleeding does not subside, continue to hold pressure. If matters are out of your control, contact an After Hours clinic or go to the Emergency Room.    

## 2019-07-31 ENCOUNTER — Encounter: Payer: Self-pay | Admitting: Dermatology

## 2019-07-31 NOTE — Progress Notes (Addendum)
   Follow-Up Visit   Subjective  Micheal Graham is a 64 y.o. male who presents for the following: Procedure (bcc below right ear).  [place] Location: Below right ear Duration:  Quality:  Associated Signs/Symptoms: Modifying Factors:  Severity:  Timing: Context: For treatment  The following portions of the chart were reviewed this encounter and updated as appropriate: Tobacco  Allergies  Meds  Problems  Med Hx  Surg Hx  Fam Hx      Objective  Well appearing patient in no apparent distress; mood and affect are within normal limits.  A focused examination was performed including head and neck.. Relevant physical exam findings are noted in the Assessment and Plan.   Assessment & Plan  Basal cell carcinoma (BCC) of skin of neck Right Submandibular Area  Skin excision  Lesion length (cm):  1.8 Lesion width (cm):  1 Margin per side (cm):  0.2 Total excision diameter (cm):  2.2 Informed consent: discussed and consent obtained   Timeout: patient name, date of birth, surgical site, and procedure verified   Procedure prep:  Patient was prepped and draped in usual sterile fashion Prep type:  Chlorhexidine Anesthesia: the lesion was anesthetized in a standard fashion   Anesthetic:  1% lidocaine w/ epinephrine 1-100,000 local infiltration Instrument used: #15 blade   Hemostasis achieved with: suture   Outcome: patient tolerated procedure well with no complications   Post-procedure details: sterile dressing applied and wound care instructions given   Dressing type: petrolatum   Additional details:  Initial curettage x3 showed deep extension, base cauterized, recuretted, narrow margin excision.  Destruction of lesion  Outcome: patient tolerated procedure well with no complications   Post-procedure details: wound care instructions given   Additional details:  Inoculated with parenteral 5% fluorouracil  Specimen 1 - Surgical pathology Differential Diagnosis: bcc Check Margins:  superior margin stain [id number]

## 2019-08-07 ENCOUNTER — Ambulatory Visit (INDEPENDENT_AMBULATORY_CARE_PROVIDER_SITE_OTHER): Payer: BC Managed Care – PPO

## 2019-08-07 ENCOUNTER — Other Ambulatory Visit: Payer: Self-pay

## 2019-08-07 ENCOUNTER — Ambulatory Visit: Payer: BC Managed Care – PPO

## 2019-08-07 DIAGNOSIS — C4491 Basal cell carcinoma of skin, unspecified: Secondary | ICD-10-CM

## 2019-08-07 DIAGNOSIS — Z4802 Encounter for removal of sutures: Secondary | ICD-10-CM

## 2019-08-07 IMAGING — CR DG HAND COMPLETE 3+V*L*
3 series · 3 of 3 positions shown · non-contrast
Comparison: None.

CLINICAL DATA: Patient stood up too fast, got lightheaded, and
passed out. Left hand swelling with bruising and gravel in the skin.

EXAM:
LEFT HAND - COMPLETE 3+ VIEW

[x hand pa left]
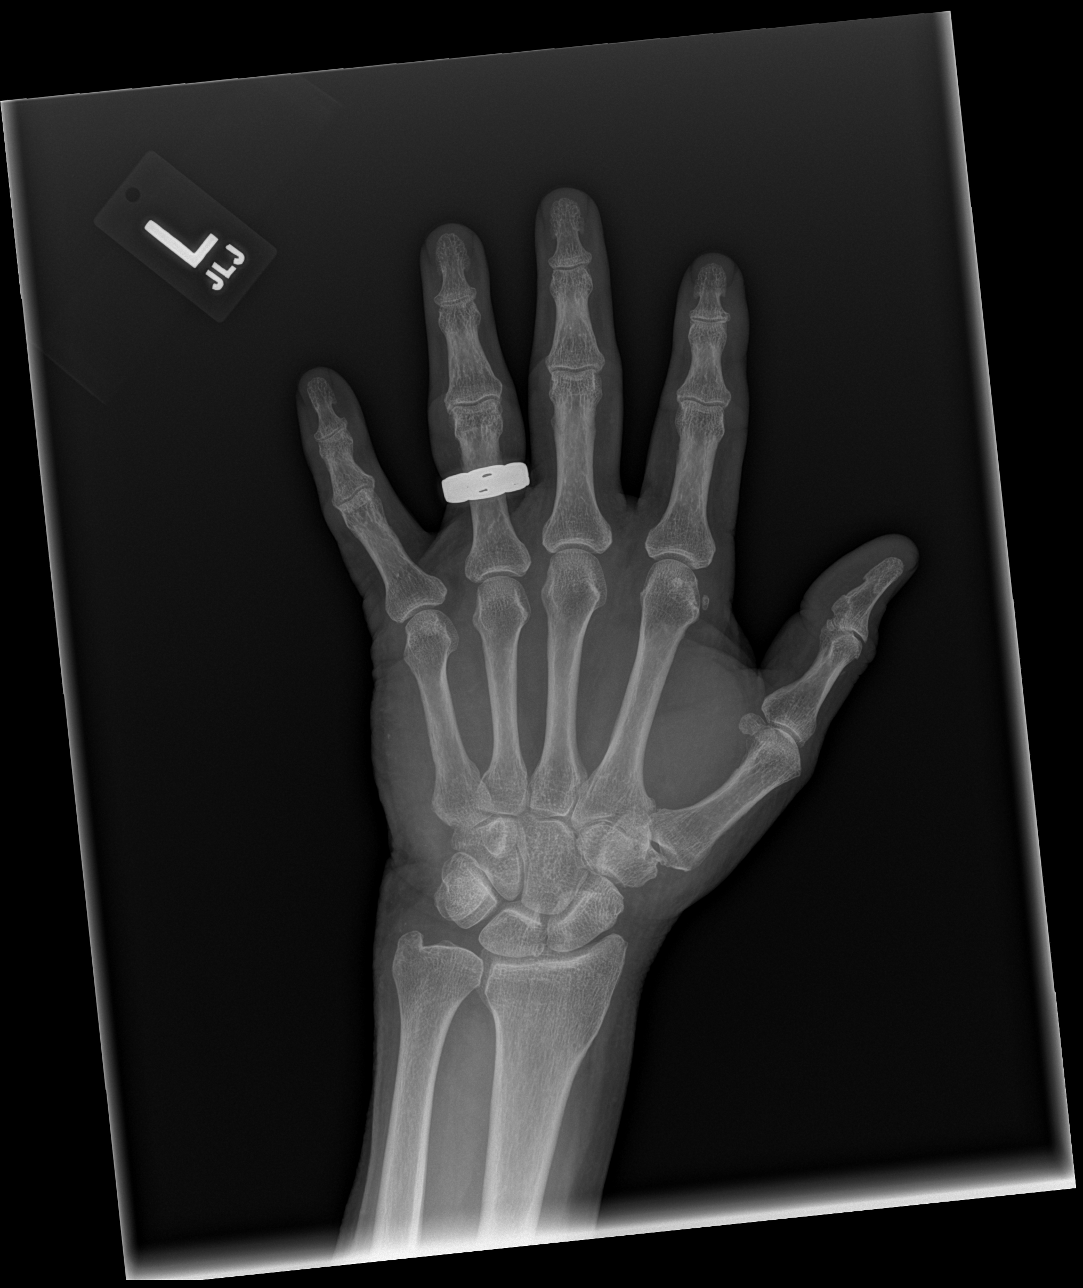

[x hand obl left]
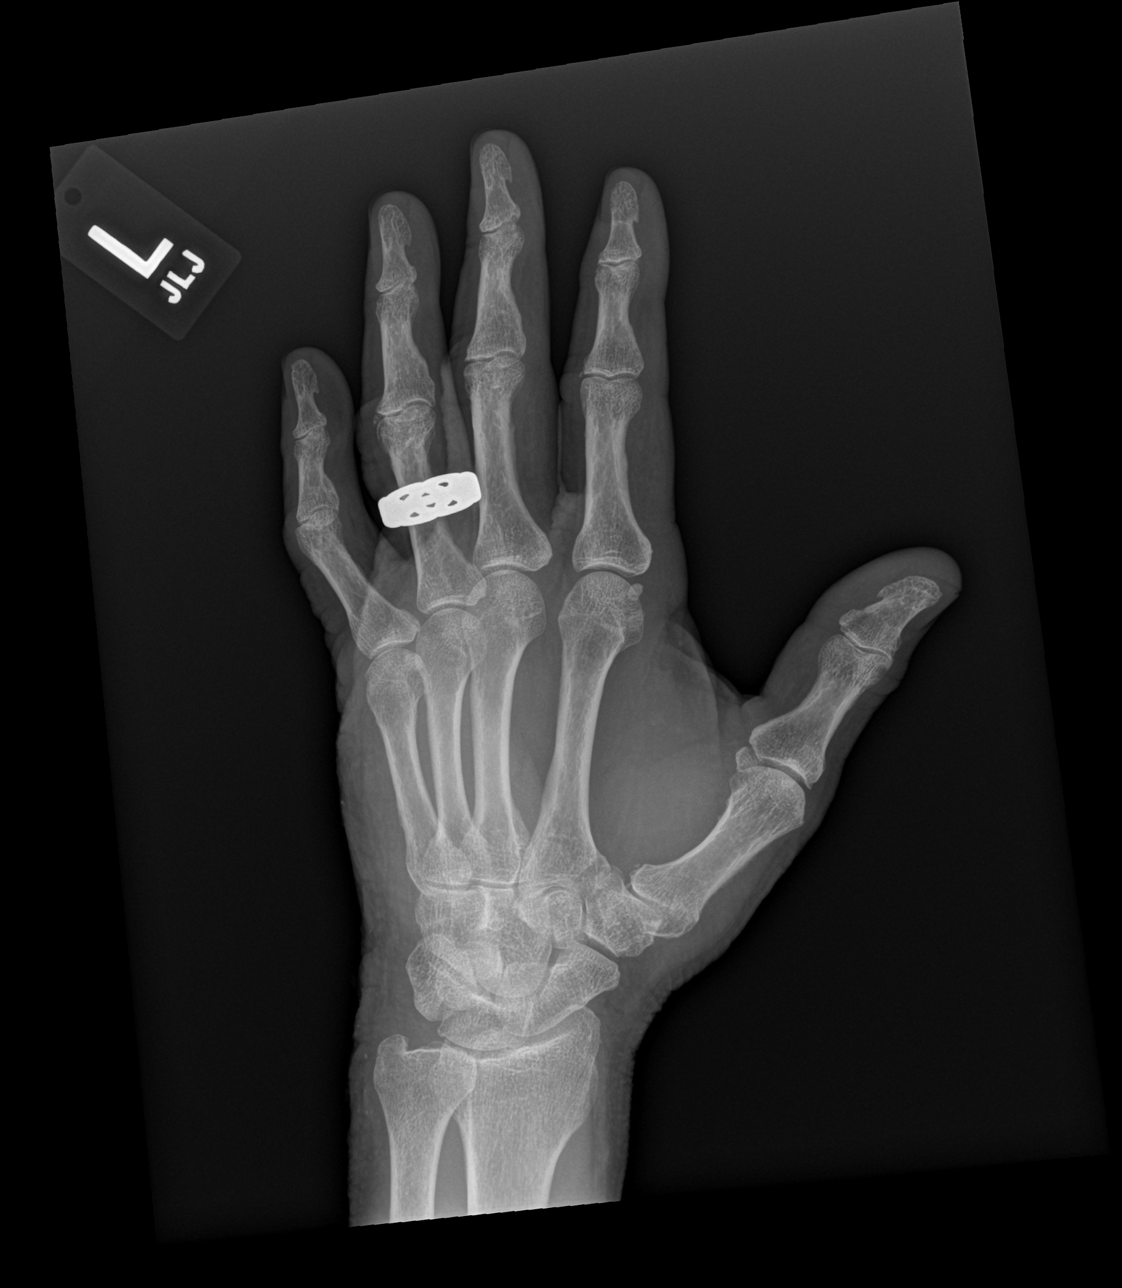

[x hand lat left]
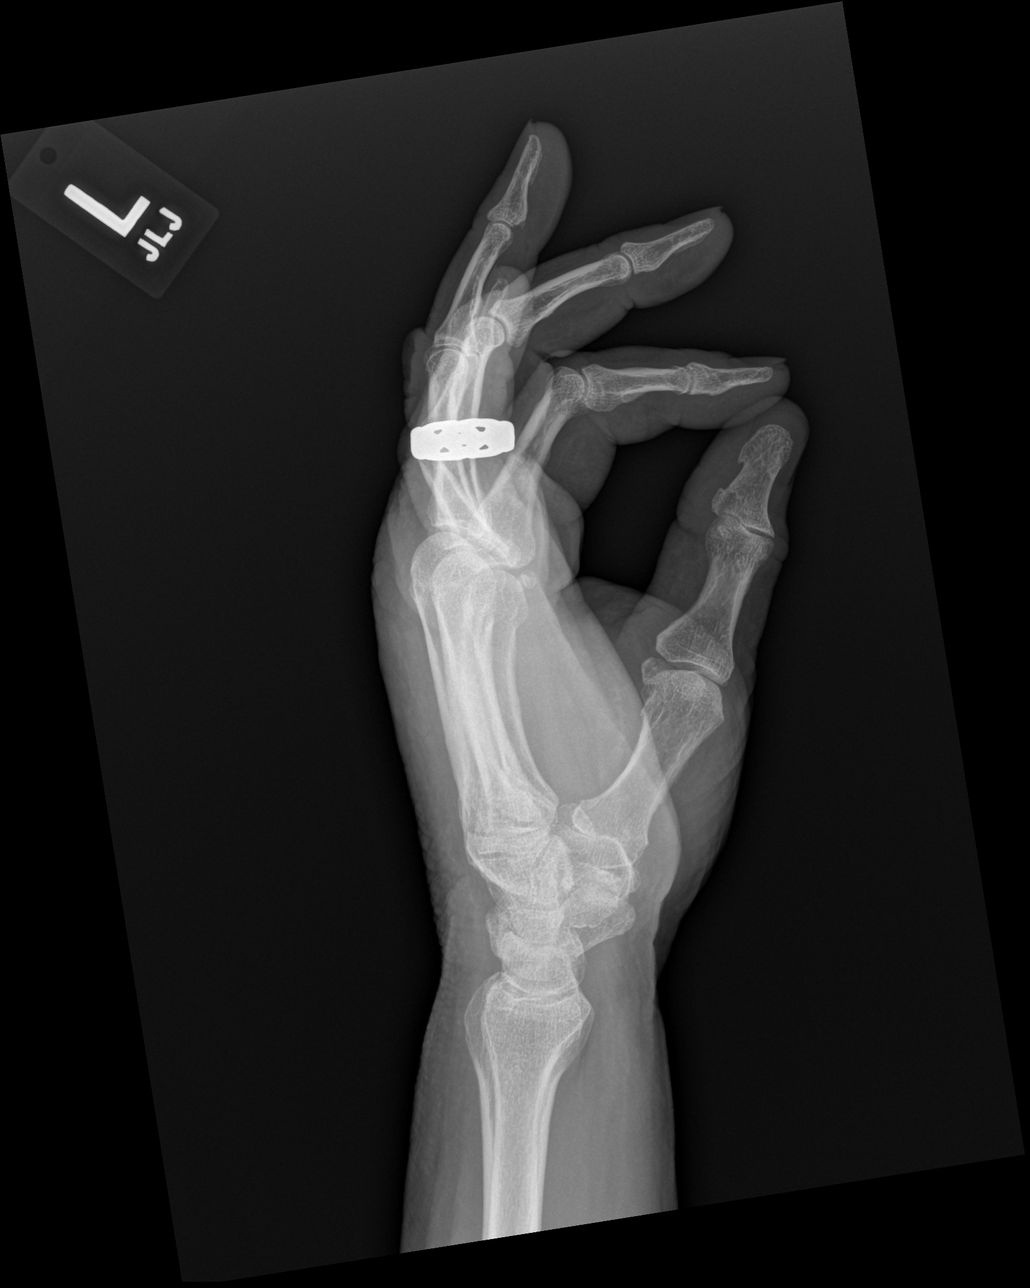

[3 of 3 positions shown; findings below may reference images not displayed]

FINDINGS: Degenerative changes in the interphalangeal joints, first
carpometacarpal joint, STT, and radiocarpal joints. No evidence of
acute fracture or dislocation. No focal bone lesion or bone
destruction. There a few tiny punctate foci of increased density
projected in or over the soft tissues of the ulnar aspect of the
left hand and wrist. These may represent surface or subcutaneous
foreign bodies.
IMPRESSION: No acute bony abnormalities. Degenerative changes in the left hand
and wrist. Tiny punctate foreign material demonstrated along the
medial aspect of the left hand and wrist, possibly surface or
subcutaneous foreign bodies.

## 2019-08-07 NOTE — Progress Notes (Signed)
Patient here this afternoon for suture removal. Path to patient. No signs or symptoms of infection.  Suture x3 removed with no issues.

## 2019-08-15 ENCOUNTER — Other Ambulatory Visit: Payer: Self-pay | Admitting: Family Medicine

## 2019-08-15 NOTE — Telephone Encounter (Signed)
Last ov: 05/23/19 Last filled:02/01/2019

## 2019-09-11 ENCOUNTER — Ambulatory Visit (INDEPENDENT_AMBULATORY_CARE_PROVIDER_SITE_OTHER): Payer: BC Managed Care – PPO

## 2019-09-11 ENCOUNTER — Other Ambulatory Visit: Payer: Self-pay

## 2019-09-11 ENCOUNTER — Encounter: Payer: Self-pay | Admitting: Podiatry

## 2019-09-11 ENCOUNTER — Ambulatory Visit (INDEPENDENT_AMBULATORY_CARE_PROVIDER_SITE_OTHER): Payer: BC Managed Care – PPO | Admitting: Podiatry

## 2019-09-11 DIAGNOSIS — G609 Hereditary and idiopathic neuropathy, unspecified: Secondary | ICD-10-CM

## 2019-09-11 MED ORDER — GABAPENTIN 100 MG PO CAPS: 100.0000 mg | ORAL_CAPSULE | Freq: Three times a day (TID) | ORAL | 3 refills | Status: DC

## 2019-09-11 NOTE — Progress Notes (Signed)
HPI: 64 y.o. male presenting today as a new patient for evaluation of what has previously been diagnosed as peripheral polyneuropathy.  Patient states over the last 5-6 years she has been experiencing pins-and-needles burning tingling numbness to the bilateral feet.  The symptoms are isolated to the feet only and do not extend past the ankles.  He has seen a neurologist in the past and also his primary care physician who both diagnosed him with idiopathic peripheral neuropathy.  He presents for further treatment evaluation  Past Medical History:  Diagnosis Date  . Allergy   . 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 Atypical nevus 01/16/2013   moderate-right lower back (WS)  . Atypical nevus 05/29/2013   moderate-mid back   . Basal cell carcinoma 07/15/1999   right neck (EXC)  . Basal cell carcinoma 08/29/1999   sclerosis- right neck (MOHS)  . Basal cell carcinoma 02/15/2008   right shin-(CX35FU)  . Basal cell carcinoma 08/13/2014   nod-right mid back (CX35FU), nod-right side nose (EXC)  . Basal cell carcinoma 08/23/2014   nod-right side nose sup (MOHS)  . Basal cell carcinoma 08/12/2015   sup/nod-left ear rim (CX35FU)  . Basal cell carcinoma 01/22/2016   sup-right scapula (CX35FU)  . Basal cell carcinoma 06/09/2017   sup/nod-left post shoulder (CX35FU)  . Basal cell carcinoma 11/17/2018   sup/nod-right side of nose (CX35FU)  . Basal cell carcinoma 01/26/2019   nod-right side nose bridge, inf  . BCC (basal cell carcinoma) 06/19/2019   below right ear-cx3 and Excision  . Cancer [facility]) 2013   right shin  . Chronic kidney disease   . Cognitive impairment, mild, so stated 01/23/2014  . Colon polyps   . 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 Hypertension   . Migraines   . Squamous cell carcinoma of skin 09/07/2018   in situ-right mid shin (CX35FU)     Physical Exam: General: The patient is alert and oriented x3 in no acute distress.  Dermatology: Skin is  warm, dry and supple bilateral lower extremities. Negative for open lesions or macerations.  Vascular: Palpable pedal pulses bilaterally. No edema or erythema noted. Capillary refill within normal limits.  Neurological: Epicritic and protective threshold grossly intact bilaterally.  Clinically the patient states that he experiences pins-and-needles burning tingling numbness and electrical sensations more symptomatic in the evenings.  Musculoskeletal Exam: Range of motion within normal limits to all pedal and ankle joints bilateral. Muscle strength 5/5 in all groups bilateral.   Radiographic Exam:  Normal osseous mineralization. Joint spaces preserved. No fracture/dislocation/boney destruction.    Assessment: 1.  Idiopathic peripheral polyneuropathy bilateral feet   Plan of Care:  1. Patient evaluated. X-Rays reviewed.  2.  The patient has not tried any oral medication to help alleviate the patient's symptoms. 3.  Prescription for gabapentin 100 mg 3 times daily 4.  Continue orthopedic shoes which seem to alleviate the patient's symptoms during the day 5.  Recommend not going barefoot 6.  Return to clinic as needed.  If patient's symptoms do not improve with the gabapentin we will refer the patient to neurology      Edrick Kins, DPM [facility]  Dr. Edrick Kins, DPM    [address]  [place],  [address]                Office [phone]  Fax [phone]

## 2019-09-11 NOTE — Progress Notes (Signed)
Dg  

## 2019-09-18 ENCOUNTER — Encounter: Payer: Self-pay | Admitting: Family Medicine

## 2019-10-11 ENCOUNTER — Other Ambulatory Visit: Payer: Self-pay | Admitting: Family Medicine

## 2019-10-16 ENCOUNTER — Encounter: Payer: Self-pay | Admitting: Family Medicine

## 2019-10-16 DIAGNOSIS — K900 Celiac disease: Secondary | ICD-10-CM

## 2019-10-30 ENCOUNTER — Other Ambulatory Visit: Payer: Self-pay

## 2019-10-30 ENCOUNTER — Ambulatory Visit (INDEPENDENT_AMBULATORY_CARE_PROVIDER_SITE_OTHER): Payer: BC Managed Care – PPO | Admitting: Dermatology

## 2019-10-30 DIAGNOSIS — L570 Actinic keratosis: Secondary | ICD-10-CM | POA: Diagnosis not present

## 2019-10-30 DIAGNOSIS — L821 Other seborrheic keratosis: Secondary | ICD-10-CM

## 2019-10-30 MED ORDER — IMIQUIMOD 5 % EX CREA: TOPICAL_CREAM | Freq: Every evening | CUTANEOUS | 1 refills | Status: DC

## 2019-10-30 NOTE — Patient Instructions (Addendum)
Routine follow-up for Micheal Graham date of birth January 30, 1956.  Several issues discussed today.  In the area behind his right ear where he has had a previous skin cancer treated there is a little roughness.  Objectively this better fits benign keratosis but he will try generic Aldara on the area 3-5 times weekly for 4-5 weeks to see if it will clear it.  He had an area on the right nostril (not a previous Mohs or biopsy) bleed but neither he nor I can find it today (including with dermoscopy).  If he has recurrent bleeding on the right nostril I have asked him to please put a little Sharpie mark around the spot and to try and come in within a few days for biopsy if indicated.  Finally he does have an increasing number of keratoses on his arms.  Most of these are clinically benign, some may actually represent viral warts, and one on the left forearm is more hornlike and may be a precancer.  The spot was treated with 5-second liquid nitrogen freeze and will probably swell and peel in the next 2 weeks.  He will proceed with his scheduled Mohs surgery and I will plan on checking [name] at 54-monthintervals.

## 2019-11-08 ENCOUNTER — Other Ambulatory Visit: Payer: Self-pay | Admitting: Family Medicine

## 2019-12-06 ENCOUNTER — Encounter: Payer: Self-pay | Admitting: Dermatology

## 2019-12-06 NOTE — Progress Notes (Signed)
   Follow-Up Visit   Subjective  Micheal Graham is a 64 y.o. male who presents for the following: Follow-up (Pt stated--place on the nose having surgery 11/06/2019--bleeding.).  New bump nose Location:  Duration:  Quality: Bled twice Associated Signs/Symptoms: Modifying Factors:  Severity:  Timing: Context:   Objective  Well appearing patient in no apparent distress; mood and affect are within normal limits.  Focused skin examination today of the head, neck, arms, hands.  Assessment & Plan    AK (actinic keratosis) Left Forearm - Posterior  Destruction of lesion - Left Forearm - Posterior Complexity: simple   Destruction method: cryotherapy   Informed consent: discussed and consent obtained   Timeout:  patient name, date of birth, surgical site, and procedure verified Lesion destroyed using liquid nitrogen: Yes   Cryotherapy cycles:  5 Outcome: patient tolerated procedure well with no complications   Post-procedure details: wound care instructions given    Ordered Medications: imiquimod (ALDARA) 5 % cream  Seborrheic keratosis (2) Left Forearm - Posterior; Right Forearm - Posterior  Can leave if stable   Routine follow-up for Micheal Graham date of birth [birth year].  Several issues discussed today.  In the area behind his right ear where he has had a previous skin cancer treated there is a little roughness.  Objectively this better fits benign keratosis but he will try generic Aldara on the area 3-5 times weekly for 4-5 weeks to see if it will clear it.  He had an area on the right nostril (not a previous Mohs or biopsy) bleed but neither he nor I can find it today (including with dermoscopy).  If he has recurrent bleeding on the right nostril I have asked him to please put a little Sharpie mark around the spot and to try and come in within a few days for biopsy if indicated.  Finally he does have an increasing number of keratoses on his arms.  Most of these are clinically  benign, some may actually represent viral warts, and one on the left forearm is more hornlike and may be a precancer.  The spot was treated with 5-second liquid nitrogen freeze and will probably swell and peel in the next 2 weeks.  He will proceed with his scheduled Mohs surgery and I will plan on checking Micheal Graham at 9-monthintervals.   I, Micheal Monarch MD, have reviewed all documentation for this visit.  The documentation on 12/06/19 for the exam, diagnosis, procedures, and orders are all accurate and complete.

## 2019-12-21 ENCOUNTER — Other Ambulatory Visit: Payer: Self-pay | Admitting: Family Medicine

## 2019-12-22 NOTE — Telephone Encounter (Signed)
Okay for refill?  

## 2019-12-24 NOTE — Telephone Encounter (Signed)
I refilled once, but he is overdue for follow up labs- PSA, CBC, testosterone level.

## 2019-12-26 ENCOUNTER — Other Ambulatory Visit: Payer: Self-pay

## 2019-12-26 ENCOUNTER — Ambulatory Visit: Payer: BC Managed Care – PPO | Admitting: Family Medicine

## 2019-12-26 ENCOUNTER — Encounter: Payer: Self-pay | Admitting: Family Medicine

## 2019-12-26 VITALS — BP 120/60 | HR 90 | Temp 98.2°F | Ht 72.0 in | Wt 244.0 lb

## 2019-12-26 DIAGNOSIS — I10 Essential (primary) hypertension: Secondary | ICD-10-CM

## 2019-12-26 DIAGNOSIS — I8312 Varicose veins of left lower extremity with inflammation: Secondary | ICD-10-CM

## 2019-12-26 DIAGNOSIS — F419 Anxiety disorder, unspecified: Secondary | ICD-10-CM

## 2019-12-26 MED ORDER — SERTRALINE HCL 100 MG PO TABS: 100.0000 mg | ORAL_TABLET | Freq: Every day | ORAL | 3 refills | Status: DC

## 2019-12-26 MED ORDER — METOPROLOL SUCCINATE ER 50 MG PO TB24: ORAL_TABLET | ORAL | 3 refills | Status: DC

## 2019-12-26 NOTE — Patient Instructions (Signed)
Varicose Veins Varicose veins are veins that have become enlarged, bulged, and twisted. They most often appear in the legs. What are the causes? This condition is caused by damage to the valves in the vein. These valves help blood return to your heart. When they are damaged and they stop working properly, blood may flow backward and back up in the veins near the skin, causing the veins to get larger and appear twisted. The condition can result from any issue that causes blood to back up, like pregnancy, prolonged standing, or obesity. What increases the risk? This condition is more likely to develop in people who are:  On their feet a lot.  Pregnant.  Overweight. What are the signs or symptoms? Symptoms of this condition include:  Bulging, twisted, and bluish veins.  A feeling of heaviness. This may be worse at the end of the day.  Leg pain. This may be worse at the end of the day.  Swelling in the leg.  Changes in skin color over the veins. How is this diagnosed? This condition may be diagnosed based on your symptoms, a physical exam, and an ultrasound test. How is this treated? Treatment for this condition may involve:  Avoiding sitting or standing in one position for long periods of time.  Wearing compression stockings. These stockings help to prevent blood clots and reduce swelling in the legs.  Raising (elevating) the legs when resting.  Losing weight.  Exercising regularly. If you have persistent symptoms or want to improve the way your varicose veins look, you may choose to have a procedure to close the varicose veins off or to remove them. Treatments to close off the veins include:  Sclerotherapy. In this treatment, a solution is injected into a vein to close it off.  Laser treatment. In this treatment, the vein is heated with a laser to close it off.  Radiofrequency vein ablation. In this treatment, an electrical current produced by radio waves is used to close  off the vein. Treatments to remove the veins include:  Phlebectomy. In this treatment, the veins are removed through small incisions made over the veins.  Vein ligation and stripping. In this treatment, incisions are made over the veins. The veins are then removed after being tied (ligated) with stitches (sutures). Follow these instructions at home: Activity  Walk as much as possible. Walking increases blood flow. This helps blood return to the heart and takes pressure off your veins. It also increases your cardiovascular strength.  Follow your health care provider's instructions about exercising.  Do not stand or sit in one position for a long period of time.  Do not sit with your legs crossed.  Rest with your legs raised during the day. General instructions   Follow any diet instructions given to you by your health care provider.  Wear compression stockings as directed by your health care provider. Do not wear other kinds of tight clothing around your legs, pelvis, or waist.  Elevate your legs at night to above the level of your heart.  If you get a cut in the skin over the varicose vein and the vein bleeds: ? Lie down with your leg raised. ? Apply firm pressure to the cut with a clean cloth until the bleeding stops. ? Place a bandage (dressing) on the cut. Contact a health care provider if:  The skin around your varicose veins starts to break down.  You have pain, redness, tenderness, or hard swelling over a vein.  You  are uncomfortable because of pain.  You get a cut in the skin over a varicose vein and it will not stop bleeding. Summary  Varicose veins are veins that have become enlarged, bulged, and twisted. They most often appear in the legs.  This condition is caused by damage to the valves in the vein. These valves help blood return to your heart.  Treatment for this condition includes frequent movements, wearing compression stockings, losing weight, and  exercising regularly. In some cases, procedures are done to close off or remove the veins.  Treatment for this condition may include wearing compression stockings, elevating the legs, losing weight, and engaging in regular activity. In some cases, procedures are done to close off or remove the veins. This information is not intended to replace advice given to you by your health care provider. Make sure you discuss any questions you have with your health care provider. Document Revised: 05/19/2018 Document Reviewed: 04/15/2016 Elsevier Patient Education  [place].

## 2019-12-26 NOTE — Progress Notes (Signed)
Established Patient Office Visit  Subjective:  Patient ID: Micheal Graham, male    DOB: [birth year]  Age: 64 y.o. MRN: [id number]  CC:  Chief Complaint  Patient presents with  . Leg Injury    HPI Micheal Graham presents for the following issues  He has longstanding history of varicose veins.  Apparently had some type of "vein stripping "procedure when he was in his mid 57s.  He has had chronic varicosities left lower extremity greater than right.  He does have some pain but relatively minimal.  He has not noted any signs of inflammation such as erythema or warmth.  No recent leg injury.  Does not have any pain with ambulation.  He has noticed over the past few months the veins in his left calf seem to be more prominent than they had previously though they have been swollen for several years.  He has used compression in the past but not consistently.  No past history of DVT.  Other medical problems include history of celiac disease, low testosterone, hereditary and idiopathic peripheral neuropathy, hypertension.  He does need refills of metoprolol and sertraline.  His blood pressures been stable.  He has referral to see celiac specialist at [facility] in January.  Tries to avoid glutens.  Past Medical History:  Diagnosis Date  . Allergy   . 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 Atypical nevus 01/16/2013   moderate-right lower back (WS)  . Atypical nevus 05/29/2013   moderate-mid back   . Basal cell carcinoma 07/15/1999   right neck (EXC)  . Basal cell carcinoma 08/29/1999   sclerosis- right neck (MOHS)  . Basal cell carcinoma 02/15/2008   right shin-(CX35FU)  . Basal cell carcinoma 08/13/2014   nod-right mid back (CX35FU), nod-right side nose (EXC)  . Basal cell carcinoma 08/23/2014   nod-right side nose sup (MOHS)  . Basal cell carcinoma 08/12/2015   sup/nod-left ear rim (CX35FU)  . Basal cell carcinoma 01/22/2016   sup-right scapula (CX35FU)  . Basal cell  carcinoma 06/09/2017   sup/nod-left post shoulder (CX35FU)  . Basal cell carcinoma 11/17/2018   sup/nod-right side of nose (CX35FU)  . Basal cell carcinoma 01/26/2019   nod-right side nose bridge, inf  . BCC (basal cell carcinoma) 06/19/2019   below right ear-cx3 and Excision  . Cancer [facility]) 2013   right shin  . Chronic kidney disease   . Cognitive impairment, mild, so stated 01/23/2014  . Colon polyps   . 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 Hypertension   . Migraines   . Squamous cell carcinoma of skin 09/07/2018   in situ-right mid shin (CX35FU)    Past Surgical History:  Procedure Laterality Date  . ABDOMINAL HERNIA REPAIR  2012  . APPENDECTOMY  1974  . left knee  2011   surgery  . VARICOSE VEIN SURGERY      Family History  Problem Relation Age of Onset  . Hypertension Mother   . Heart disease Father   . Cancer Maternal Uncle        prostate  . Stroke Paternal Grandfather     Social History   Socioeconomic History  . Marital status: Single    Spouse name: Not on file  . Number of children: Not on file  . Years of education: Not on file  . Highest education level: Not on file  Occupational History  . Not on file  Tobacco Use  . Smoking status: Former Smoker  Packs/day: 2.00    Years: 30.00    Pack years: 60.00    Types: Cigarettes    Quit date: 09/09/1999    Years since quitting: 20.3  . Smokeless tobacco: Never Used  Vaping Use  . Vaping Use: Never used  Substance and Sexual Activity  . Alcohol use: No    Alcohol/week: 0.0 standard drinks    Comment: occasional beer  . Drug use: No  . Sexual activity: Not on file  Other Topics Concern  . Not on file  Social History Narrative   Right handed, caffeine 2 cups daily, FT -truck driver, Married, 2 kids, HS graduate   Social Determinants of Health   Financial Resource Strain:   . Difficulty of Paying Living Expenses: Not on file  Food Insecurity:   . Worried About Charity fundraiser in the Last  Year: Not on file  . Ran Out of Food in the Last Year: Not on file  Transportation Needs:   . Lack of Transportation (Medical): Not on file  . Lack of Transportation (Non-Medical): Not on file  Physical Activity:   . Days of Exercise per Week: Not on file  . Minutes of Exercise per Session: Not on file  Stress:   . Feeling of Stress : Not on file  Social Connections:   . Frequency of Communication with Friends and Family: Not on file  . Frequency of Social Gatherings with Friends and Family: Not on file  . Attends Religious Services: Not on file  . Active Member of Clubs or Organizations: Not on file  . Attends Archivist Meetings: Not on file  . Marital Status: Not on file  Intimate Partner Violence:   . Fear of Current or Ex-Partner: Not on file  . Emotionally Abused: Not on file  . Physically Abused: Not on file  . Sexually Abused: Not on file    Outpatient Medications Prior to Visit  Medication Sig Dispense Refill  . amLODipine-benazepril (LOTREL) 5-20 MG capsule TAKE 1 CAPSULE BY MOUTH EVERY DAY 90 capsule 1  . Cholecalciferol (VITAMIN D) 2000 units CAPS Take by mouth.    . diclofenac Sodium (VOLTAREN) 1 % GEL Apply 4 g topically 4 (four) times daily. 100 g 1  . diphenhydrAMINE HCl, Sleep, (UNISOM SLEEPGELS) 50 MG CAPS     . fexofenadine (ALLEGRA) 180 MG tablet Take 180 mg by mouth daily.    Marland Kitchen gabapentin (NEURONTIN) 100 MG capsule Take 1 capsule (100 mg total) by mouth 3 (three) times daily. 90 capsule 3  . imiquimod (ALDARA) 5 % cream Apply topically at bedtime. 12 each 1  . Prenatal Multivit-Min-Fe-FA (PRENATAL VITAMINS PO) Take by mouth daily.    . sildenafil (VIAGRA) 100 MG tablet Take 0.5-1 tablets (50-100 mg total) by mouth daily as needed for erectile dysfunction. 6 tablet 11  . Testosterone 20.25 MG/ACT (1.62%) GEL USE 2 PUMPS ON EACH ARM DAILY 150 g 0  . metoprolol succinate (TOPROL-XL) 50 MG 24 hr tablet TAKE ONE TABLET BY MOUTH DAILY WITH OR IMMEDIATELY  FOLLOWING A MEAL 90 tablet 0  . sertraline (ZOLOFT) 100 MG tablet TAKE 1 TABLET BY MOUTH EVERY DAY 90 tablet 3   No facility-administered medications prior to visit.    No Known Allergies  ROS Review of Systems  Constitutional: Negative for chills, fatigue and fever.  Eyes: Negative for visual disturbance.  Respiratory: Negative for cough, chest tightness and shortness of breath.   Cardiovascular: Negative for chest pain and palpitations.  Endocrine: Negative for polydipsia and polyuria.  Neurological: Negative for dizziness, syncope, weakness, light-headedness and headaches.      Objective:    Physical Exam Constitutional:      Appearance: He is well-developed.  HENT:     Right Ear: External ear normal.     Left Ear: External ear normal.  Eyes:     Pupils: Pupils are equal, round, and reactive to light.  Neck:     Thyroid: No thyromegaly.  Cardiovascular:     Rate and Rhythm: Normal rate and regular rhythm.  Pulmonary:     Effort: Pulmonary effort is normal. No respiratory distress.     Breath sounds: Normal breath sounds. No wheezing or rales.  Musculoskeletal:     Cervical back: Neck supple.     Comments: Has some varicosities in both lower extremities left greater than right.  He has a combination of small spider veins in his feet and ankles and some larger varicose veins in his left calf.  There is no localized tenderness.  No warmth.  No erythema.  Feet are warm to touch with excellent capillary refill and good distal pulses.  No ulcerations.  Neurological:     Mental Status: He is alert and oriented to person, place, and time.     BP 120/60   Pulse 90   Temp 98.2 F (36.8 C) (Oral)   Ht 6' (1.829 m)   Wt 244 lb (110.7 kg)   SpO2 96%   BMI 33.09 kg/m  Wt Readings from Last 3 Encounters:  12/26/19 244 lb (110.7 kg)  05/23/19 243 lb 4.8 oz (110.4 kg)  01/13/19 211 lb 8 oz (95.9 kg)     Health Maintenance Due  Topic Date Due  . Hepatitis C Screening   Never done  . COVID-19 Vaccine (1) Never done  . HIV Screening  Never done    There are no preventive care reminders to display for this patient.  Lab Results  Component Value Date   TSH 1.79 11/12/2016   Lab Results  Component Value Date   WBC 7.5 11/07/2018   HGB 13.5 11/07/2018   HCT 39.3 11/07/2018   MCV 95.0 11/07/2018   PLT 145.0 (L) 11/07/2018   Lab Results  Component Value Date   NA 138 11/07/2018   K 4.2 11/07/2018   CO2 25 11/07/2018   GLUCOSE 124 (H) 11/07/2018   BUN 15 11/07/2018   CREATININE 1.46 11/07/2018   BILITOT 0.3 11/07/2018   ALKPHOS 110 11/07/2018   AST 18 11/07/2018   ALT 25 11/07/2018   PROT 6.8 11/07/2018   ALBUMIN 4.0 11/07/2018   CALCIUM 8.6 11/07/2018   ANIONGAP 10 08/28/2017   GFR 48.48 (L) 11/07/2018   Lab Results  Component Value Date   CHOL 199 06/12/2015   Lab Results  Component Value Date   HDL 34.10 (L) 06/12/2015   No results found for: [facility] Lab Results  Component Value Date   TRIG 271.0 (H) 06/12/2015   Lab Results  Component Value Date   CHOLHDL 6 06/12/2015   Lab Results  Component Value Date   HGBA1C 5.9 11/30/2016      Assessment & Plan:   #1 varicose veins left lower extremity greater than right, minimally symptomatic  -Get back on compressions stockings daily as tolerated -We offered referral to vein specialist but at this point he wishes to observe  #2 hypertension stable and at goal -Refill metoprolol for 1 year  #3 history of anxiety currently  stable on sertraline.  Patient requesting refills -Sertraline refilled for 1 year  Meds ordered this encounter  Medications  . sertraline (ZOLOFT) 100 MG tablet    Sig: Take 1 tablet (100 mg total) by mouth daily.    Dispense:  90 tablet    Refill:  3  . metoprolol succinate (TOPROL-XL) 50 MG 24 hr tablet    Sig: TAKE ONE TABLET BY MOUTH DAILY WITH OR IMMEDIATELY FOLLOWING A MEAL    Dispense:  90 tablet    Refill:  3    Follow-up: No follow-ups  on file.    Carolann Littler, MD

## 2020-01-08 ENCOUNTER — Encounter: Payer: Self-pay | Admitting: Family Medicine

## 2020-01-08 ENCOUNTER — Ambulatory Visit: Payer: BC Managed Care – PPO

## 2020-01-08 ENCOUNTER — Telehealth (INDEPENDENT_AMBULATORY_CARE_PROVIDER_SITE_OTHER): Payer: BC Managed Care – PPO | Admitting: Family Medicine

## 2020-01-08 ENCOUNTER — Ambulatory Visit (INDEPENDENT_AMBULATORY_CARE_PROVIDER_SITE_OTHER)
Admission: RE | Admit: 2020-01-08 | Discharge: 2020-01-08 | Disposition: A | Discharge status: Home / Self Care | Payer: BC Managed Care – PPO | Source: Ambulatory Visit | Attending: Family Medicine | Admitting: Family Medicine

## 2020-01-08 ENCOUNTER — Other Ambulatory Visit: Payer: Self-pay

## 2020-01-08 ENCOUNTER — Telehealth: Payer: BC Managed Care – PPO | Admitting: Family Medicine

## 2020-01-08 DIAGNOSIS — R059 Cough, unspecified: Secondary | ICD-10-CM | POA: Diagnosis not present

## 2020-01-08 NOTE — Progress Notes (Signed)
Patient ID: Micheal Graham, male   DOB: 1955/09/02, 64 y.o.   MRN: [id number]  This visit type was conducted due to national recommendations for restrictions regarding the COVID-19 pandemic in an effort to limit this patient's exposure and mitigate transmission in our community.   Virtual Visit via Telephone Note  I connected with Micheal Graham on 01/08/20 at  3:00 PM EDT by telephone and verified that I am speaking with the correct person using two identifiers.   I discussed the limitations, risks, security and privacy concerns of performing an evaluation and management service by telephone and the availability of in person appointments. I also discussed with the patient that there may be a patient responsible charge related to this service. The patient expressed understanding and agreed to proceed.  Location patient: home Location provider: work or home office Participants present for the call: patient, provider Patient did not have a visit in the prior 7 days to address this/these issue(s).   History of Present Illness: Micheal Graham called with onset of illness around 9 days ago on Saturday.  He states he started feeling "bad ".  He had some chest congestion and onset of some cough.  By Sunday the next day he went to urgent care and had both rapid and PCR Covid test which were both negative.  He has had Moderna vaccine.  He was prescribed Zithromax and prednisone and by Wednesday of last week felt much better.  He then seemed to have a relapse Saturday night with low-grade fever in the 99 range and body aches.  No nausea or vomiting.  Has had occasional diarrhea.  Has taken Robitussin and over-the-counter nasal decongestant without much improvement.  No significant dyspnea  Non-smoker.  No pleuritic pain.  No chest pains.  Past Medical History:  Diagnosis Date  . Allergy   . 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 Atypical nevus 01/16/2013   moderate-right lower back (WS)   . Atypical nevus 05/29/2013   moderate-mid back   . Basal cell carcinoma 07/15/1999   right neck (EXC)  . Basal cell carcinoma 08/29/1999   sclerosis- right neck (MOHS)  . Basal cell carcinoma 02/15/2008   right shin-(CX35FU)  . Basal cell carcinoma 08/13/2014   nod-right mid back (CX35FU), nod-right side nose (EXC)  . Basal cell carcinoma 08/23/2014   nod-right side nose sup (MOHS)  . Basal cell carcinoma 08/12/2015   sup/nod-left ear rim (CX35FU)  . Basal cell carcinoma 01/22/2016   sup-right scapula (CX35FU)  . Basal cell carcinoma 06/09/2017   sup/nod-left post shoulder (CX35FU)  . Basal cell carcinoma 11/17/2018   sup/nod-right side of nose (CX35FU)  . Basal cell carcinoma 01/26/2019   nod-right side nose bridge, inf  . BCC (basal cell carcinoma) 06/19/2019   below right ear-cx3 and Excision  . Cancer [facility]) 2013   right shin  . Chronic kidney disease   . Cognitive impairment, mild, so stated 01/23/2014  . Colon polyps   . 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 Hypertension   . Migraines   . Squamous cell carcinoma of skin 09/07/2018   in situ-right mid shin (CX35FU)   Past Surgical History:  Procedure Laterality Date  . ABDOMINAL HERNIA REPAIR  2012  . APPENDECTOMY  1974  . left knee  2011   surgery  . VARICOSE VEIN SURGERY      reports that he quit smoking about 20 years ago. His smoking use included cigarettes. He has a 60.00  pack-year smoking history. He has never used smokeless tobacco. He reports that he does not drink alcohol and does not use drugs. family history includes Cancer in his maternal uncle; Heart disease in his father; Hypertension in his mother; Stroke in his paternal grandfather. No Known Allergies    Observations/Objective: Patient sounds cheerful and well on the phone. I do not appreciate any SOB. Speech and thought processing are grossly intact. Patient reported vitals:  Assessment and Plan:  Patient relates approximately 10-day history  of some cough, fatigue, myalgias with recent negative rapid antigen and negative PCR Covid testing.  Onset last week of possible low-grade fever  -Recommend PA and lateral chest x-ray to rule out pneumonia -Order was placed and he will go this afternoon for that -[place] of fluids and rest in the meantime.  Follow-up immediately for increased shortness of breath or other concerns  Follow Up Instructions:  -As above   [id number] [id number] [id number] 11-20 [id number] 21-30 I did not refer this patient for an OV in the next 24 hours for this/these issue(s).  I discussed the assessment and treatment plan with the patient. The patient was provided an opportunity to ask questions and all were answered. The patient agreed with the plan and demonstrated an understanding of the instructions.   The patient was advised to call back or seek an in-person evaluation if the symptoms worsen or if the condition fails to improve as anticipated.  I provided 17 minutes of non-face-to-face time during this encounter.   Carolann Littler, MD

## 2020-01-08 NOTE — Progress Notes (Unsigned)
Dg chest

## 2020-01-09 ENCOUNTER — Encounter: Payer: Self-pay | Admitting: Family Medicine

## 2020-01-10 ENCOUNTER — Other Ambulatory Visit: Payer: Self-pay

## 2020-01-10 ENCOUNTER — Emergency Department (HOSPITAL_COMMUNITY): Payer: BC Managed Care – PPO

## 2020-01-10 ENCOUNTER — Encounter (HOSPITAL_COMMUNITY): Payer: Self-pay

## 2020-01-10 ENCOUNTER — Inpatient Hospital Stay (HOSPITAL_COMMUNITY)
Admission: EM | Admit: 2020-01-10 | Discharge: 2020-01-18 | DRG: 177 | Disposition: A | Discharge status: Home / Self Care | Payer: BC Managed Care – PPO | Attending: Internal Medicine | Admitting: Internal Medicine

## 2020-01-10 DIAGNOSIS — R197 Diarrhea, unspecified: Secondary | ICD-10-CM | POA: Diagnosis present

## 2020-01-10 DIAGNOSIS — Z87891 Personal history of nicotine dependence: Secondary | ICD-10-CM

## 2020-01-10 DIAGNOSIS — E1165 Type 2 diabetes mellitus with hyperglycemia: Secondary | ICD-10-CM | POA: Diagnosis present

## 2020-01-10 DIAGNOSIS — U071 COVID-19: Secondary | ICD-10-CM

## 2020-01-10 DIAGNOSIS — D696 Thrombocytopenia, unspecified: Secondary | ICD-10-CM | POA: Diagnosis present

## 2020-01-10 DIAGNOSIS — J189 Pneumonia, unspecified organism: Secondary | ICD-10-CM | POA: Diagnosis present

## 2020-01-10 DIAGNOSIS — R7989 Other specified abnormal findings of blood chemistry: Secondary | ICD-10-CM | POA: Diagnosis present

## 2020-01-10 DIAGNOSIS — E875 Hyperkalemia: Secondary | ICD-10-CM | POA: Diagnosis present

## 2020-01-10 DIAGNOSIS — Z79899 Other long term (current) drug therapy: Secondary | ICD-10-CM

## 2020-01-10 DIAGNOSIS — J069 Acute upper respiratory infection, unspecified: Secondary | ICD-10-CM | POA: Diagnosis present

## 2020-01-10 DIAGNOSIS — E871 Hypo-osmolality and hyponatremia: Secondary | ICD-10-CM | POA: Diagnosis present

## 2020-01-10 DIAGNOSIS — R7401 Elevation of levels of liver transaminase levels: Secondary | ICD-10-CM | POA: Diagnosis present

## 2020-01-10 DIAGNOSIS — Z8249 Family history of ischemic heart disease and other diseases of the circulatory system: Secondary | ICD-10-CM

## 2020-01-10 DIAGNOSIS — I10 Essential (primary) hypertension: Secondary | ICD-10-CM

## 2020-01-10 DIAGNOSIS — F32A Depression, unspecified: Secondary | ICD-10-CM | POA: Diagnosis present

## 2020-01-10 DIAGNOSIS — I129 Hypertensive chronic kidney disease with stage 1 through stage 4 chronic kidney disease, or unspecified chronic kidney disease: Secondary | ICD-10-CM | POA: Diagnosis present

## 2020-01-10 DIAGNOSIS — N1831 Chronic kidney disease, stage 3a: Secondary | ICD-10-CM | POA: Diagnosis present

## 2020-01-10 DIAGNOSIS — E860 Dehydration: Secondary | ICD-10-CM | POA: Diagnosis present

## 2020-01-10 DIAGNOSIS — J1282 Pneumonia due to coronavirus disease 2019: Secondary | ICD-10-CM | POA: Diagnosis present

## 2020-01-10 DIAGNOSIS — J9601 Acute respiratory failure with hypoxia: Secondary | ICD-10-CM | POA: Diagnosis present

## 2020-01-10 DIAGNOSIS — E1122 Type 2 diabetes mellitus with diabetic chronic kidney disease: Secondary | ICD-10-CM | POA: Diagnosis present

## 2020-01-10 DIAGNOSIS — F419 Anxiety disorder, unspecified: Secondary | ICD-10-CM | POA: Diagnosis present

## 2020-01-10 DIAGNOSIS — N179 Acute kidney failure, unspecified: Secondary | ICD-10-CM | POA: Diagnosis present

## 2020-01-10 DIAGNOSIS — J159 Unspecified bacterial pneumonia: Secondary | ICD-10-CM | POA: Diagnosis present

## 2020-01-10 LAB — BASIC METABOLIC PANEL
Anion gap: 13 (ref 5–15)
BUN: 52 mg/dL — ABNORMAL HIGH (ref 8–23)
CO2: 17 mmol/L — ABNORMAL LOW (ref 22–32)
Calcium: 8 mg/dL — ABNORMAL LOW (ref 8.9–10.3)
Chloride: 99 mmol/L (ref 98–111)
Creatinine, Ser: 3.09 mg/dL — ABNORMAL HIGH (ref 0.61–1.24)
GFR calc non Af Amer: 20 mL/min — ABNORMAL LOW (ref >60)
Glucose, Bld: 307 mg/dL — ABNORMAL HIGH (ref 70–99)
Potassium: 4.5 mmol/L (ref 3.5–5.1)
Sodium: 129 mmol/L — ABNORMAL LOW (ref 135–145)

## 2020-01-10 LAB — CBC
HCT: 36.7 % — ABNORMAL LOW (ref 39.0–52.0)
Hemoglobin: 12.2 g/dL — ABNORMAL LOW (ref 13.0–17.0)
MCH: 31.5 pg (ref 26.0–34.0)
MCHC: 33.2 g/dL (ref 30.0–36.0)
MCV: 94.8 fL (ref 80.0–100.0)
Platelets: 131 10*3/uL — ABNORMAL LOW (ref 150–400)
RBC: 3.87 MIL/uL — ABNORMAL LOW (ref 4.22–5.81)
RDW: 17 % — ABNORMAL HIGH (ref 11.5–15.5)
WBC: 11.3 10*3/uL — ABNORMAL HIGH (ref 4.0–10.5)
nRBC: 0 % (ref 0.0–0.2)

## 2020-01-10 LAB — TROPONIN I (HIGH SENSITIVITY): Troponin I (High Sensitivity): 12 ng/L (ref <18)

## 2020-01-10 NOTE — ED Triage Notes (Signed)
Pt states that he tested Covid + on Monday, increased SOB and CP, pt is fully vaccinated with Moderna.

## 2020-01-11 ENCOUNTER — Encounter (HOSPITAL_COMMUNITY): Payer: Self-pay | Admitting: Family Medicine

## 2020-01-11 DIAGNOSIS — E875 Hyperkalemia: Secondary | ICD-10-CM | POA: Diagnosis present

## 2020-01-11 DIAGNOSIS — Z8249 Family history of ischemic heart disease and other diseases of the circulatory system: Secondary | ICD-10-CM | POA: Diagnosis not present

## 2020-01-11 DIAGNOSIS — F32A Depression, unspecified: Secondary | ICD-10-CM | POA: Diagnosis present

## 2020-01-11 DIAGNOSIS — Z79899 Other long term (current) drug therapy: Secondary | ICD-10-CM | POA: Diagnosis not present

## 2020-01-11 DIAGNOSIS — J9601 Acute respiratory failure with hypoxia: Secondary | ICD-10-CM | POA: Diagnosis present

## 2020-01-11 DIAGNOSIS — K900 Celiac disease: Secondary | ICD-10-CM

## 2020-01-11 DIAGNOSIS — J189 Pneumonia, unspecified organism: Secondary | ICD-10-CM | POA: Diagnosis present

## 2020-01-11 DIAGNOSIS — R197 Diarrhea, unspecified: Secondary | ICD-10-CM | POA: Diagnosis present

## 2020-01-11 DIAGNOSIS — J1282 Pneumonia due to coronavirus disease 2019: Secondary | ICD-10-CM | POA: Diagnosis present

## 2020-01-11 DIAGNOSIS — U071 COVID-19: Secondary | ICD-10-CM | POA: Diagnosis present

## 2020-01-11 DIAGNOSIS — I129 Hypertensive chronic kidney disease with stage 1 through stage 4 chronic kidney disease, or unspecified chronic kidney disease: Secondary | ICD-10-CM | POA: Diagnosis present

## 2020-01-11 DIAGNOSIS — R7401 Elevation of levels of liver transaminase levels: Secondary | ICD-10-CM | POA: Diagnosis present

## 2020-01-11 DIAGNOSIS — Z87891 Personal history of nicotine dependence: Secondary | ICD-10-CM | POA: Diagnosis not present

## 2020-01-11 DIAGNOSIS — N1831 Chronic kidney disease, stage 3a: Secondary | ICD-10-CM | POA: Diagnosis present

## 2020-01-11 DIAGNOSIS — E1165 Type 2 diabetes mellitus with hyperglycemia: Secondary | ICD-10-CM | POA: Diagnosis present

## 2020-01-11 DIAGNOSIS — J069 Acute upper respiratory infection, unspecified: Secondary | ICD-10-CM | POA: Diagnosis not present

## 2020-01-11 DIAGNOSIS — R7989 Other specified abnormal findings of blood chemistry: Secondary | ICD-10-CM | POA: Diagnosis present

## 2020-01-11 DIAGNOSIS — E871 Hypo-osmolality and hyponatremia: Secondary | ICD-10-CM | POA: Diagnosis present

## 2020-01-11 DIAGNOSIS — R079 Chest pain, unspecified: Secondary | ICD-10-CM | POA: Insufficient documentation

## 2020-01-11 DIAGNOSIS — I10 Essential (primary) hypertension: Secondary | ICD-10-CM | POA: Diagnosis not present

## 2020-01-11 DIAGNOSIS — E1122 Type 2 diabetes mellitus with diabetic chronic kidney disease: Secondary | ICD-10-CM | POA: Diagnosis present

## 2020-01-11 DIAGNOSIS — J159 Unspecified bacterial pneumonia: Secondary | ICD-10-CM | POA: Diagnosis present

## 2020-01-11 DIAGNOSIS — N179 Acute kidney failure, unspecified: Secondary | ICD-10-CM | POA: Diagnosis present

## 2020-01-11 DIAGNOSIS — F419 Anxiety disorder, unspecified: Secondary | ICD-10-CM | POA: Diagnosis present

## 2020-01-11 DIAGNOSIS — D696 Thrombocytopenia, unspecified: Secondary | ICD-10-CM | POA: Diagnosis present

## 2020-01-11 DIAGNOSIS — E860 Dehydration: Secondary | ICD-10-CM | POA: Diagnosis present

## 2020-01-11 HISTORY — DX: Celiac disease: K90.0

## 2020-01-11 LAB — FIBRINOGEN: Fibrinogen: 786 mg/dL — ABNORMAL HIGH (ref 210–475)

## 2020-01-11 LAB — FERRITIN: Ferritin: 1330 ng/mL — ABNORMAL HIGH (ref 24–336)

## 2020-01-11 LAB — HEPATIC FUNCTION PANEL
ALT: 37 U/L (ref 0–44)
AST: 58 U/L — ABNORMAL HIGH (ref 15–41)
Albumin: 3 g/dL — ABNORMAL LOW (ref 3.5–5.0)
Alkaline Phosphatase: 71 U/L (ref 38–126)
Bilirubin, Direct: 0.2 mg/dL (ref 0.0–0.2)
Indirect Bilirubin: 0.4 mg/dL (ref 0.3–0.9)
Total Bilirubin: 0.6 mg/dL (ref 0.3–1.2)
Total Protein: 7.3 g/dL (ref 6.5–8.1)

## 2020-01-11 LAB — RESPIRATORY PANEL BY RT PCR (FLU A&B, COVID)
Influenza A by PCR: NEGATIVE
Influenza B by PCR: NEGATIVE
SARS Coronavirus 2 by RT PCR: POSITIVE — AB

## 2020-01-11 LAB — D-DIMER, QUANTITATIVE: D-Dimer, Quant: 0.47 ug/mL-FEU (ref 0.00–0.50)

## 2020-01-11 LAB — CBG MONITORING, ED
Glucose-Capillary: 316 mg/dL — ABNORMAL HIGH (ref 70–99)
Glucose-Capillary: 411 mg/dL — ABNORMAL HIGH (ref 70–99)

## 2020-01-11 LAB — HEMOGLOBIN A1C
Hgb A1c MFr Bld: 8.3 % — ABNORMAL HIGH (ref 4.8–5.6)
Mean Plasma Glucose: 191.51 mg/dL

## 2020-01-11 LAB — TRIGLYCERIDES: Triglycerides: 297 mg/dL — ABNORMAL HIGH (ref <150)

## 2020-01-11 LAB — PROCALCITONIN: Procalcitonin: 7.46 ng/mL

## 2020-01-11 LAB — LACTIC ACID, PLASMA: Lactic Acid, Venous: 1.7 mmol/L (ref 0.5–1.9)

## 2020-01-11 LAB — GLUCOSE, CAPILLARY
Glucose-Capillary: 406 mg/dL — ABNORMAL HIGH (ref 70–99)
Glucose-Capillary: 330 mg/dL — ABNORMAL HIGH (ref 70–99)

## 2020-01-11 LAB — HIV ANTIBODY (ROUTINE TESTING W REFLEX): HIV Screen 4th Generation wRfx: NONREACTIVE

## 2020-01-11 LAB — TROPONIN I (HIGH SENSITIVITY): Troponin I (High Sensitivity): 12 ng/L (ref <18)

## 2020-01-11 LAB — LACTATE DEHYDROGENASE: LDH: 226 U/L — ABNORMAL HIGH (ref 98–192)

## 2020-01-11 LAB — C-REACTIVE PROTEIN: CRP: 24.2 mg/dL — ABNORMAL HIGH (ref <1.0)

## 2020-01-11 MED ORDER — SODIUM CHLORIDE 0.9 % IV SOLN
2.0000 g | INTRAVENOUS | Status: AC
  Administered 2020-01-11 – 2020-01-15 (×5): 2 g via INTRAVENOUS
  Filled 2020-01-11 (×5): qty 20

## 2020-01-11 MED ORDER — HEPARIN SODIUM (PORCINE) 5000 UNIT/ML IJ SOLN
5000.0000 [IU] | Freq: Three times a day (TID) | INTRAMUSCULAR | Status: DC
  Administered 2020-01-11 – 2020-01-15 (×13): 5000 [IU] via SUBCUTANEOUS
  Filled 2020-01-11 (×13): qty 1

## 2020-01-11 MED ORDER — LORATADINE 10 MG PO TABS
10.0000 mg | ORAL_TABLET | Freq: Every day | ORAL | Status: DC
  Administered 2020-01-11: 10 mg via ORAL
  Filled 2020-01-11 (×2): qty 1

## 2020-01-11 MED ORDER — ACETAMINOPHEN 325 MG PO TABS
650.0000 mg | ORAL_TABLET | Freq: Four times a day (QID) | ORAL | Status: DC | PRN
  Filled 2020-01-11 (×2): qty 2

## 2020-01-11 MED ORDER — SODIUM CHLORIDE 0.9 % IV SOLN
100.0000 mg | Freq: Every day | INTRAVENOUS | Status: AC
  Administered 2020-01-12 – 2020-01-15 (×4): 100 mg via INTRAVENOUS
  Filled 2020-01-11 (×4): qty 20

## 2020-01-11 MED ORDER — SODIUM CHLORIDE 0.9 % IV SOLN
500.0000 mg | INTRAVENOUS | Status: DC
  Administered 2020-01-11 – 2020-01-14 (×4): 500 mg via INTRAVENOUS
  Filled 2020-01-11 (×8): qty 500

## 2020-01-11 MED ORDER — SODIUM CHLORIDE 0.9 % IV BOLUS
500.0000 mL | Freq: Once | INTRAVENOUS | Status: AC
  Administered 2020-01-11: 500 mL via INTRAVENOUS

## 2020-01-11 MED ORDER — INSULIN ASPART 100 UNIT/ML ~~LOC~~ SOLN
0.0000 [IU] | Freq: Every day | SUBCUTANEOUS | Status: DC
  Administered 2020-01-11: 4 [IU] via SUBCUTANEOUS
  Administered 2020-01-12: 3 [IU] via SUBCUTANEOUS
  Administered 2020-01-14: 2 [IU] via SUBCUTANEOUS

## 2020-01-11 MED ORDER — SODIUM CHLORIDE 0.9 % IV SOLN: INTRAVENOUS | Status: DC

## 2020-01-11 MED ORDER — GUAIFENESIN-DM 100-10 MG/5ML PO SYRP
10.0000 mL | ORAL_SOLUTION | ORAL | Status: DC | PRN
  Administered 2020-01-12 – 2020-01-17 (×3): 10 mL via ORAL
  Filled 2020-01-11 (×3): qty 10

## 2020-01-11 MED ORDER — MAGNESIUM OXIDE 400 (241.3 MG) MG PO TABS
400.0000 mg | ORAL_TABLET | Freq: Every day | ORAL | Status: DC
  Administered 2020-01-11 – 2020-01-18 (×8): 400 mg via ORAL
  Filled 2020-01-11 (×8): qty 1

## 2020-01-11 MED ORDER — ONDANSETRON HCL 4 MG/2ML IJ SOLN: 4.0000 mg | Freq: Four times a day (QID) | INTRAMUSCULAR | Status: DC | PRN

## 2020-01-11 MED ORDER — ONDANSETRON HCL 4 MG PO TABS: 4.0000 mg | ORAL_TABLET | Freq: Four times a day (QID) | ORAL | Status: DC | PRN

## 2020-01-11 MED ORDER — DEXAMETHASONE SODIUM PHOSPHATE 10 MG/ML IJ SOLN
10.0000 mg | Freq: Once | INTRAMUSCULAR | Status: AC
  Administered 2020-01-11: 10 mg via INTRAVENOUS
  Filled 2020-01-11: qty 1

## 2020-01-11 MED ORDER — INSULIN ASPART 100 UNIT/ML ~~LOC~~ SOLN
0.0000 [IU] | Freq: Three times a day (TID) | SUBCUTANEOUS | Status: DC
  Administered 2020-01-11 – 2020-01-12 (×4): 20 [IU] via SUBCUTANEOUS
  Administered 2020-01-13: 15 [IU] via SUBCUTANEOUS
  Administered 2020-01-13: 20 [IU] via SUBCUTANEOUS
  Administered 2020-01-13: 15 [IU] via SUBCUTANEOUS
  Administered 2020-01-14: 7 [IU] via SUBCUTANEOUS
  Administered 2020-01-14: 5 [IU] via SUBCUTANEOUS
  Administered 2020-01-15: 4 [IU] via SUBCUTANEOUS
  Administered 2020-01-15: 7 [IU] via SUBCUTANEOUS
  Administered 2020-01-15: 4 [IU] via SUBCUTANEOUS
  Administered 2020-01-16 (×3): 3 [IU] via SUBCUTANEOUS
  Administered 2020-01-17: 4 [IU] via SUBCUTANEOUS
  Administered 2020-01-17: 7 [IU] via SUBCUTANEOUS
  Administered 2020-01-18: 3 [IU] via SUBCUTANEOUS

## 2020-01-11 MED ORDER — INSULIN ASPART 100 UNIT/ML ~~LOC~~ SOLN: 0.0000 [IU] | Freq: Every day | SUBCUTANEOUS | Status: DC

## 2020-01-11 MED ORDER — INSULIN ASPART 100 UNIT/ML ~~LOC~~ SOLN
14.0000 [IU] | Freq: Once | SUBCUTANEOUS | Status: AC
  Administered 2020-01-11: 14 [IU] via SUBCUTANEOUS

## 2020-01-11 MED ORDER — DEXAMETHASONE SODIUM PHOSPHATE 10 MG/ML IJ SOLN: 6.0000 mg | INTRAMUSCULAR | Status: DC

## 2020-01-11 MED ORDER — SODIUM CHLORIDE 0.9% FLUSH
3.0000 mL | Freq: Two times a day (BID) | INTRAVENOUS | Status: DC
  Administered 2020-01-11 – 2020-01-18 (×15): 3 mL via INTRAVENOUS

## 2020-01-11 MED ORDER — HYDROCODONE-ACETAMINOPHEN 5-325 MG PO TABS: 1.0000 | ORAL_TABLET | ORAL | Status: DC | PRN

## 2020-01-11 MED ORDER — SERTRALINE HCL 100 MG PO TABS
100.0000 mg | ORAL_TABLET | Freq: Every day | ORAL | Status: DC
  Administered 2020-01-11 – 2020-01-18 (×8): 100 mg via ORAL
  Filled 2020-01-11 (×8): qty 1

## 2020-01-11 MED ORDER — METHYLPREDNISOLONE SODIUM SUCC 125 MG IJ SOLR
60.0000 mg | Freq: Two times a day (BID) | INTRAMUSCULAR | Status: AC
  Administered 2020-01-11 – 2020-01-16 (×12): 60 mg via INTRAVENOUS
  Filled 2020-01-11 (×12): qty 2

## 2020-01-11 MED ORDER — VITAMIN D 25 MCG (1000 UNIT) PO TABS
2000.0000 [IU] | ORAL_TABLET | Freq: Every day | ORAL | Status: DC
  Administered 2020-01-11 – 2020-01-18 (×8): 2000 [IU] via ORAL
  Filled 2020-01-11 (×8): qty 2

## 2020-01-11 MED ORDER — POLYETHYLENE GLYCOL 3350 17 G PO PACK: 17.0000 g | PACK | Freq: Every day | ORAL | Status: DC | PRN

## 2020-01-11 MED ORDER — INSULIN ASPART 100 UNIT/ML ~~LOC~~ SOLN
0.0000 [IU] | Freq: Three times a day (TID) | SUBCUTANEOUS | Status: DC
  Administered 2020-01-11: 6 [IU] via SUBCUTANEOUS
  Administered 2020-01-11: 4 [IU] via SUBCUTANEOUS

## 2020-01-11 MED ORDER — CALCIUM CARBONATE-VITAMIN D 500-200 MG-UNIT PO TABS
1.0000 | ORAL_TABLET | Freq: Every day | ORAL | Status: DC
  Administered 2020-01-11 – 2020-01-18 (×8): 1 via ORAL
  Filled 2020-01-11 (×8): qty 1

## 2020-01-11 MED ORDER — INSULIN GLARGINE 100 UNIT/ML ~~LOC~~ SOLN
14.0000 [IU] | Freq: Every day | SUBCUTANEOUS | Status: DC
  Administered 2020-01-11: 14 [IU] via SUBCUTANEOUS
  Filled 2020-01-11 (×2): qty 0.14

## 2020-01-11 MED ORDER — LINAGLIPTIN 5 MG PO TABS
5.0000 mg | ORAL_TABLET | Freq: Every day | ORAL | Status: DC
  Administered 2020-01-11 – 2020-01-18 (×8): 5 mg via ORAL
  Filled 2020-01-11 (×8): qty 1

## 2020-01-11 MED ORDER — HYDROCOD POLST-CPM POLST ER 10-8 MG/5ML PO SUER: 5.0000 mL | Freq: Two times a day (BID) | ORAL | Status: DC | PRN

## 2020-01-11 MED ORDER — SODIUM CHLORIDE 0.9 % IV SOLN
200.0000 mg | Freq: Once | INTRAVENOUS | Status: AC
  Administered 2020-01-11: 200 mg via INTRAVENOUS
  Filled 2020-01-11: qty 40

## 2020-01-11 NOTE — ED Provider Notes (Signed)
Emergency Department Provider Note   I have reviewed the triage vital signs and the nursing notes.   HISTORY  Chief Complaint Covid   HPI Micheal Graham is a 64 y.o. male with PMH reviewed below presents to the emergency department with COVID-19 symptoms over the past week.  Patient was vaccinated fully with the maternal vaccine.  He developed flulike symptoms over the past several days which of gradually worsened.  He is developed shortness of breath with associated generalized chest tightness without radiation of symptoms or other modifying factors.  He is scheduled to receive the monoclonal antibody infusion tomorrow morning at [facility] but with worsening symptoms in the past 24 hours presents to the emergency department.  He has no prior history of lung disease or smoking.  He has frequent coughing but is having trouble getting up any sputum.  He has not had vomiting and has had some mild diarrhea.  No radiation of symptoms or other modifying factors.  Past Medical History:  Diagnosis Date  . Allergy   . 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 Atypical nevus 01/16/2013   moderate-right lower back (WS)  . Atypical nevus 05/29/2013   moderate-mid back   . Basal cell carcinoma 07/15/1999   right neck (EXC)  . Basal cell carcinoma 08/29/1999   sclerosis- right neck (MOHS)  . Basal cell carcinoma 02/15/2008   right shin-(CX35FU)  . Basal cell carcinoma 08/13/2014   nod-right mid back (CX35FU), nod-right side nose (EXC)  . Basal cell carcinoma 08/23/2014   nod-right side nose sup (MOHS)  . Basal cell carcinoma 08/12/2015   sup/nod-left ear rim (CX35FU)  . Basal cell carcinoma 01/22/2016   sup-right scapula (CX35FU)  . Basal cell carcinoma 06/09/2017   sup/nod-left post shoulder (CX35FU)  . Basal cell carcinoma 11/17/2018   sup/nod-right side of nose (CX35FU)  . Basal cell carcinoma 01/26/2019   nod-right side nose bridge, inf  . BCC (basal cell carcinoma)  06/19/2019   below right ear-cx3 and Excision  . Cancer [facility]) 2013   right shin  . Chronic kidney disease   . Cognitive impairment, mild, so stated 01/23/2014  . Colon polyps   . 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 Hypertension   . Migraines   . Squamous cell carcinoma of skin 09/07/2018   in situ-right mid shin (CX35FU)    Patient Active Problem List   Diagnosis Date Noted  . Pneumonia 01/11/2020  . Acute respiratory failure with hypoxia ([place]) 01/11/2020  . Acute renal failure superimposed on stage 3a chronic kidney disease ([place]) 01/11/2020  . Chest pain 01/11/2020  . Osteopenia 01/13/2019  . Celiac disease 11/07/2018  . Low testosterone 11/30/2016  . Vitamin D deficiency 11/30/2016  . Carpal tunnel syndrome of right wrist 02/14/2014  . Hereditary and idiopathic peripheral neuropathy 02/14/2014  . Cognitive impairment, mild, so stated 01/23/2014  . Ataxia 01/23/2014  . 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 Hypertension 09/09/2011  . Kidney stones 09/09/2011  . Anxiety 09/09/2011  . Colon polyps 09/09/2011  . Basal cell cancer 09/09/2011    Past Surgical History:  Procedure Laterality Date  . ABDOMINAL HERNIA REPAIR  2012  . APPENDECTOMY  1974  . left knee  2011   surgery  . VARICOSE VEIN SURGERY      Allergies Patient has no known allergies.  Family History  Problem Relation Age of Onset  . Hypertension Mother   . Heart disease Father   . Cancer Maternal  Uncle        prostate  . Stroke Paternal Grandfather     Social History Social History   Tobacco Use  . Smoking status: Former Smoker    Packs/day: 2.00    Years: 30.00    Pack years: 60.00    Types: Cigarettes    Quit date: 09/09/1999    Years since quitting: 20.3  . Smokeless tobacco: Never Used  Vaping Use  . Vaping Use: Never used  Substance Use Topics  . Alcohol use: No    Alcohol/week: 0.0 standard drinks    Comment: occasional beer  . Drug use: No    Review of Systems  Constitutional:  Positive fever/chills and fatigue.  Eyes: No visual changes. ENT: No sore throat. Cardiovascular: Positive chest pain. Respiratory: Positive shortness of breath and cough.  Gastrointestinal: No abdominal pain.  No nausea, no vomiting. Positive diarrhea.  No constipation. Genitourinary: Negative for dysuria. Musculoskeletal: Negative for back pain. Skin: Negative for rash. Neurological: Negative for focal weakness or numbness. Positive HA.   10-point ROS otherwise negative.  ____________________________________________   PHYSICAL EXAM:  VITAL SIGNS: ED Triage Vitals  Enc Vitals Group     BP 01/10/20 2206 (!) 91/55     Pulse Rate 01/10/20 2206 67     Resp 01/10/20 2206 (!) 22     Temp 01/10/20 2206 98.5 F (36.9 C)     Temp Source 01/10/20 2206 Oral     SpO2 01/10/20 2206 95 %   Constitutional: Alert and oriented. Patient with increased WOB with speaking and with any minimal movement.  Eyes: Conjunctivae are normal. Head: Atraumatic. Nose: No congestion/rhinnorhea. Mouth/Throat: Mucous membranes are moist.   Neck: No stridor.   Cardiovascular: Normal rate, regular rhythm. Good peripheral circulation. Grossly normal heart sounds.   Respiratory: Increased respiratory effort.  No retractions. Lungs CTAB. Gastrointestinal: Soft and nontender. No distention.  Musculoskeletal: No lower extremity tenderness nor edema. No gross deformities of extremities. Neurologic:  Normal speech and language.  Skin:  Skin is warm, dry and intact. No rash noted.  ____________________________________________   LABS (all labs ordered are listed, but only abnormal results are displayed)  Labs Reviewed  BASIC METABOLIC PANEL - Abnormal; Notable for the following components:      Result Value   Sodium 129 (*)    CO2 17 (*)    Glucose, Bld 307 (*)    BUN 52 (*)    Creatinine, Ser 3.09 (*)    Calcium 8.0 (*)    GFR calc non Af Amer 20 (*)    All other components within normal limits  CBC -  Abnormal; Notable for the following components:   WBC 11.3 (*)    RBC 3.87 (*)    Hemoglobin 12.2 (*)    HCT 36.7 (*)    RDW 17.0 (*)    Platelets 131 (*)    All other components within normal limits  LACTATE DEHYDROGENASE - Abnormal; Notable for the following components:   LDH 226 (*)    All other components within normal limits  FERRITIN - Abnormal; Notable for the following components:   Ferritin 1,330 (*)    All other components within normal limits  TRIGLYCERIDES - Abnormal; Notable for the following components:   Triglycerides 297 (*)    All other components within normal limits  FIBRINOGEN - Abnormal; Notable for the following components:   Fibrinogen 786 (*)    All other components within normal limits  C-REACTIVE PROTEIN - Abnormal;  Notable for the following components:   CRP 24.2 (*)    All other components within normal limits  RESPIRATORY PANEL BY RT PCR (FLU A&B, COVID)  CULTURE, BLOOD (ROUTINE X 2)  CULTURE, BLOOD (ROUTINE X 2)  LACTIC ACID, PLASMA  D-DIMER, QUANTITATIVE (NOT AT [facility])  PROCALCITONIN  LACTIC ACID, PLASMA  HEPATIC FUNCTION PANEL  HEMOGLOBIN A1C  HIV ANTIBODY (ROUTINE TESTING W REFLEX)  URINALYSIS, COMPLETE (UACMP) WITH MICROSCOPIC  SODIUM, URINE, RANDOM  CREATININE, URINE, RANDOM  TROPONIN I (HIGH SENSITIVITY)  TROPONIN I (HIGH SENSITIVITY)   ____________________________________________  EKG   EKG Interpretation  Date/Time:  Wednesday January 10 2020 22:07:01 EDT Ventricular Rate:  109 PR Interval:  134 QRS Duration: 82 QT Interval:  324 QTC Calculation: 436 R Axis:   24 Text Interpretation: Sinus tachycardia Possible Anterior infarct , age undetermined Abnormal ECG No STEMI Confirmed by Nanda Quinton [phone]) on 01/11/2020 4:45:18 AM       ____________________________________________  RADIOLOGY  DG Chest Portable 1 View  Result Date: 01/10/2020 CLINICAL DATA:  Chest pain EXAM: PORTABLE CHEST 1 VIEW COMPARISON:  01/08/2020  FINDINGS: Cardiac shadow is stable. Aortic calcifications are again seen. Right lung is clear. Increasing patchy opacities are noted in the left lung consistent with developing infiltrate. No other focal abnormality is noted. IMPRESSION: Developing infiltrate the left mid and lower lung. Electronically Signed   By: Inez Catalina M.D.   On: 01/10/2020 22:57    ____________________________________________   PROCEDURES  Procedure(s) performed:   Procedures  CRITICAL CARE Performed by: Margette Fast Total critical care time: 35 minutes Critical care time was exclusive of separately billable procedures and treating other patients. Critical care was necessary to treat or prevent imminent or life-threatening deterioration. Critical care was time spent personally by me on the following activities: development of treatment plan with patient and/or surrogate as well as nursing, discussions with consultants, evaluation of patient's response to treatment, examination of patient, obtaining history from patient or surrogate, ordering and performing treatments and interventions, ordering and review of laboratory studies, ordering and review of radiographic studies, pulse oximetry and re-evaluation of patient's condition.  Nanda Quinton, MD Emergency Medicine  ____________________________________________   INITIAL IMPRESSION / ASSESSMENT AND PLAN / ED COURSE  Pertinent labs & imaging results that were available during my care of the patient were reviewed by me and considered in my medical decision making (see chart for details).   Patient presents to the emergency department with increasing shortness of breath the setting of known Covid acute infection.  He was diagnosed on Monday and has had 5 days of symptoms.  He is hypoxemic, initial evaluation although was not upon arrival in triage.  Patient satting 88% with good waveform.  He has frequent coughing and desatted to as low as 79% on room air.  He was  placed on 4 L nasal cannula and symptoms improved.  He does have infiltrate on his chest x-ray.  I have ordered Decadron and remdesivir.  Patient also with acute kidney injury on his chemistry.  I have added blood cultures along with inflammatory markers and patient will ultimately require admission.  Discussed patient's case with [facility] to request admission. Patient and family (if present) updated with plan. Care transferred to [facility] service.  I reviewed all nursing notes, vitals, pertinent old records, EKGs, labs, imaging (as available).   Micheal Graham was evaluated in Emergency Department on 01/11/2020 for the symptoms described in the history of present illness. He was evaluated  in the context of the global COVID-19 pandemic, which necessitated consideration that the patient might be at risk for infection with the SARS-CoV-2 virus that causes COVID-19. Institutional protocols and algorithms that pertain to the evaluation of patients at risk for COVID-19 are in a state of rapid change based on information released by regulatory bodies including the CDC and federal and state organizations. These policies and algorithms were followed during the patient's care in the ED.   ____________________________________________  FINAL CLINICAL IMPRESSION(S) / ED DIAGNOSES  Final diagnoses:  Acute respiratory failure with hypoxia (HCC)  COVID-19     MEDICATIONS GIVEN DURING THIS VISIT:  Medications  remdesivir 200 mg in sodium chloride 0.9% 250 mL IVPB (has no administration in time range)  remdesivir 100 mg in sodium chloride 0.9 % 100 mL IVPB (has no administration in time range)  insulin aspart (novoLOG) injection 0-6 Units (has no administration in time range)  insulin aspart (novoLOG) injection 0-5 Units (has no administration in time range)  heparin injection 5,000 Units (5,000 Units Subcutaneous Given 01/11/20 0611)  sodium chloride flush (NS) 0.9 % injection 3 mL (has no administration in time  range)  dexamethasone (DECADRON) injection 6 mg (has no administration in time range)  guaiFENesin-dextromethorphan (ROBITUSSIN DM) 100-10 MG/5ML syrup 10 mL (has no administration in time range)  chlorpheniramine-HYDROcodone (TUSSIONEX) 10-8 MG/5ML suspension 5 mL (has no administration in time range)  acetaminophen (TYLENOL) tablet 650 mg (has no administration in time range)  HYDROcodone-acetaminophen (NORCO/VICODIN) 5-325 MG per tablet 1-2 tablet (has no administration in time range)  ondansetron (ZOFRAN) tablet 4 mg (has no administration in time range)    Or  ondansetron (ZOFRAN) injection 4 mg (has no administration in time range)  polyethylene glycol (MIRALAX / GLYCOLAX) packet 17 g (has no administration in time range)  linagliptin (TRADJENTA) tablet 5 mg (has no administration in time range)  0.9 %  sodium chloride infusion ( Intravenous New Bag/Given 01/11/20 0610)  cefTRIAXone (ROCEPHIN) 2 g in sodium chloride 0.9 % 100 mL IVPB (2 g Intravenous New Bag/Given 01/11/20 0609)  azithromycin (ZITHROMAX) 500 mg in sodium chloride 0.9 % 250 mL IVPB (500 mg Intravenous New Bag/Given 01/11/20 0623)  dexamethasone (DECADRON) injection 10 mg (10 mg Intravenous Given 01/11/20 0521)  sodium chloride 0.9 % bolus 500 mL (500 mLs Intravenous New Bag/Given 01/11/20 0521)    Note:  This document was prepared using Dragon voice recognition software and may include unintentional dictation errors.  Nanda Quinton, MD, [facility] Emergency Medicine    Ellanore Vanhook, W[facility], MD 01/11/20 [phone]

## 2020-01-11 NOTE — H&P (Addendum)
History and Physical    Micheal Graham MVH:[id number] DOB: [birth year] DOA: 01/10/2020  PCP: [name], MD   Patient coming from: Home   Chief Complaint: SOB, loose stools, loss of appetite, cough   HPI: Micheal Graham is a 64 y.o. male with medical history significant for hypertension and chronic kidney disease stage IIIa, presenting to the emergency department with shortness of breath, cough, loose stools, and loss of appetite.  Patient reports that he is fully vaccinated against COVID-19, developed upper respiratory symptoms and malaise, reportedly had negative Covid antigen and Covid PCR test at that time, was treated with prednisone and azithromycin, and the symptoms had nearly resolved before he began to feel sick again on 01/06/2020. He reports testing positive for COVID on 10/3. Over the last few days, the patient has had loose stools, loss of appetite, nonproductive cough, and progressive dyspnea.  He denies chest pain but notes some chest "tightness."  Denies leg swelling or tenderness, denies abdominal pain, and denies vomiting.  ED Course: Upon arrival to the ED, patient is found to be afebrile, saturating in the 80s on room air, slightly tachypneic, and with blood pressure 91/55.  EKG features sinus tachycardia with rate 109.  Chest x-ray demonstrates developing infiltrate in the left mid and lower lung.  Chemistry panel notable for glucose 307 and creatinine of 3.09, up from 1.46 a year earlier.  CBC is notable for mild leukocytosis and thrombocytopenia.  High-sensitivity troponin was normal x2.  Covid PCR not yet collected.  Patient was treated with IV fluids, remdesivir, and Decadron in the ED.  Review of Systems:  All other systems reviewed and apart from HPI, are negative.  Past Medical History:  Diagnosis Date  . Allergy   . 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 Atypical nevus 01/16/2013   moderate-right lower back (WS)  . Atypical nevus  05/29/2013   moderate-mid back   . Basal cell carcinoma 07/15/1999   right neck (EXC)  . Basal cell carcinoma 08/29/1999   sclerosis- right neck (MOHS)  . Basal cell carcinoma 02/15/2008   right shin-(CX35FU)  . Basal cell carcinoma 08/13/2014   nod-right mid back (CX35FU), nod-right side nose (EXC)  . Basal cell carcinoma 08/23/2014   nod-right side nose sup (MOHS)  . Basal cell carcinoma 08/12/2015   sup/nod-left ear rim (CX35FU)  . Basal cell carcinoma 01/22/2016   sup-right scapula (CX35FU)  . Basal cell carcinoma 06/09/2017   sup/nod-left post shoulder (CX35FU)  . Basal cell carcinoma 11/17/2018   sup/nod-right side of nose (CX35FU)  . Basal cell carcinoma 01/26/2019   nod-right side nose bridge, inf  . BCC (basal cell carcinoma) 06/19/2019   below right ear-cx3 and Excision  . Cancer [facility]) 2013   right shin  . Chronic kidney disease   . Cognitive impairment, mild, so stated 01/23/2014  . Colon polyps   . 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 Hypertension   . Migraines   . Squamous cell carcinoma of skin 09/07/2018   in situ-right mid shin (CX35FU)    Past Surgical History:  Procedure Laterality Date  . ABDOMINAL HERNIA REPAIR  2012  . APPENDECTOMY  1974  . left knee  2011   surgery  . VARICOSE VEIN SURGERY      Social History:   reports that he quit smoking about 20 years ago. His smoking use included cigarettes. He has a 60.00 pack-year smoking history. He has never used smokeless tobacco. He  reports that he does not drink alcohol and does not use drugs.  No Known Allergies  Family History  Problem Relation Age of Onset  . Hypertension Mother   . Heart disease Father   . Cancer Maternal Uncle        prostate  . Stroke Paternal Grandfather      Prior to Admission medications   Medication Sig Start Date End Date Taking? Authorizing Provider  amLODipine-benazepril (LOTREL) 5-20 MG capsule TAKE 1 CAPSULE BY MOUTH EVERY DAY 11/08/19   Burchette, Alinda Sierras, MD    Cholecalciferol (VITAMIN D) 2000 units CAPS Take by mouth.    [provider]  diclofenac Sodium (VOLTAREN) 1 % GEL Apply 4 g topically 4 (four) times daily. 05/23/19   Burchette, Alinda Sierras, MD  diphenhydrAMINE HCl, Sleep, (UNISOM SLEEPGELS) 50 MG CAPS  03/07/19   [provider]  fexofenadine (ALLEGRA) 180 MG tablet Take 180 mg by mouth daily.    [provider]  gabapentin (NEURONTIN) 100 MG capsule Take 1 capsule (100 mg total) by mouth 3 (three) times daily. 09/11/19   Edrick Kins, DPM  imiquimod (ALDARA) 5 % cream Apply topically at bedtime. 10/30/19   [name], MD  metoprolol succinate (TOPROL-XL) 50 MG 24 hr tablet TAKE ONE TABLET BY MOUTH DAILY WITH OR IMMEDIATELY FOLLOWING A MEAL 12/26/19   Burchette, Alinda Sierras, MD  Prenatal Multivit-Min-Fe-FA (PRENATAL VITAMINS PO) Take by mouth daily.    [provider]  sertraline (ZOLOFT) 100 MG tablet Take 1 tablet (100 mg total) by mouth daily. 12/26/19   Burchette, Alinda Sierras, MD  sildenafil (VIAGRA) 100 MG tablet Take 0.5-1 tablets (50-100 mg total) by mouth daily as needed for erectile dysfunction. 05/23/19   Burchette, Alinda Sierras, MD  Testosterone 20.25 MG/ACT (1.62%) GEL USE 2 PUMPS ON EACH ARM DAILY 12/24/19   [name], MD    Physical Exam: Vitals:   01/10/20 2206 01/11/20 0228  BP: (!) 91/55 104/63  Pulse: 67 89  Resp: (!) 22 16  Temp: 98.5 F (36.9 C) 99.4 F (37.4 C)  TempSrc: Oral Oral  SpO2: 95% 95%    Constitutional: NAD, calm  Eyes: PERTLA, lids and conjunctivae normal ENMT: Mucous membranes are moist. Posterior pharynx clear of any exudate or lesions.   Neck: normal, supple, no masses, no thyromegaly Respiratory: no wheezing, speaking full sentances. No pallor or cyanosis.  Cardiovascular: S1 & S2 heard, regular rate and rhythm. No extremity edema.  Abdomen: No distension, no tenderness, soft. Bowel sounds active.  Musculoskeletal: no clubbing / cyanosis. No joint deformity upper and  lower extremities.   Skin: no significant rashes, lesions, ulcers. Warm, dry, well-perfused. Neurologic: CN 2-12 grossly intact. Sensation intact. Moving all extremities.  Psychiatric: Alert and oriented to person, place, and situation. Pleasant and cooperative.    Labs and Imaging on Admission: I have personally reviewed following labs and imaging studies  CBC: Recent Labs  Lab 01/10/20 2219  WBC 11.3*  HGB 12.2*  HCT 36.7*  MCV 94.8  PLT 784*   Basic Metabolic Panel: Recent Labs  Lab 01/10/20 2219  NA 129*  K 4.5  CL 99  CO2 17*  GLUCOSE 307*  BUN 52*  CREATININE 3.09*  CALCIUM 8.0*   GFR: CrCl cannot be calculated (Unknown ideal weight.). Liver Function Tests: No results for input(s): AST, ALT, ALKPHOS, BILITOT, PROT, ALBUMIN in the last 168 hours. No results for input(s): LIPASE, AMYLASE in the last 168 hours. No results for input(s):  AMMONIA in the last 168 hours. Coagulation Profile: No results for input(s): INR, PROTIME in the last 168 hours. Cardiac Enzymes: No results for input(s): CKTOTAL, CKMB, CKMBINDEX, TROPONINI in the last 168 hours. BNP (last 3 results) No results for input(s): PROBNP in the last 8760 hours. HbA1C: No results for input(s): HGBA1C in the last 72 hours. CBG: No results for input(s): GLUCAP in the last 168 hours. Lipid Profile: No results for input(s): CHOL, HDL, LDLCALC, TRIG, CHOLHDL, LDLDIRECT in the last 72 hours. Thyroid Function Tests: No results for input(s): TSH, T4TOTAL, FREET4, T3FREE, THYROIDAB in the last 72 hours. Anemia Panel: No results for input(s): VITAMINB12, FOLATE, FERRITIN, TIBC, IRON, RETICCTPCT in the last 72 hours. Urine analysis:    Component Value Date/Time   COLORURINE YELLOW 08/28/2017 2008   APPEARANCEUR CLEAR 08/28/2017 2008   LABSPEC 1.023 08/28/2017 2008   PHURINE 5.0 08/28/2017 2008   GLUCOSEU NEGATIVE 08/28/2017 2008   HGBUR NEGATIVE 08/28/2017 2008   [place] NEGATIVE 08/28/2017 2008    BILIRUBINUR neg 10/12/2013 [place] 08/28/2017 2008   PROTEINUR 30 (A) 08/28/2017 2008   UROBILINOGEN 0.2 10/12/2013 0829   NITRITE NEGATIVE 08/28/2017 2008   LEUKOCYTESUR NEGATIVE 08/28/2017 2008   Sepsis Labs: @LABRCNTIP (procalcitonin:4,lacticidven:4) )No results found for this or any previous visit (from the past 240 hour(s)).   Radiological Exams on Admission: DG Chest Portable 1 View  Result Date: 01/10/2020 CLINICAL DATA:  Chest pain EXAM: PORTABLE CHEST 1 VIEW COMPARISON:  01/08/2020 FINDINGS: Cardiac shadow is stable. Aortic calcifications are again seen. Right lung is clear. Increasing patchy opacities are noted in the left lung consistent with developing infiltrate. No other focal abnormality is noted. IMPRESSION: Developing infiltrate the left mid and lower lung. Electronically Signed   By: Inez Catalina M.D.   On: 01/10/2020 22:57    EKG: Independently reviewed. Sinus tachycardia (rate 109).   Assessment/Plan   1. Pneumonia; acute hypoxic respiratory failure  - Presents with progressive SOB, non-productive cough, loose stools, and loss of appetite, reports testing positive for COVID on 10/3 (unable to confirm), and is found to have new 4 Lpm supplemental O2 requirement and left-sided infiltrate on CXR  - He was started on Decadron and remdesivir in ED  - Confirm COVID, obtain blood cultures, check procalcitonin, CRP, LFTs, and d-dimer, start Rocephin and azithromycin pending COVID and procalcitonin results    2. Acute kidney injury superimposed on CKD IIIa   - SCr is 3.09 on admission, up from 1.46 a year ago  - Suspect this is acute prerenal azotemia in setting of recent diarrhea and anorexia  - Check UA and FENa, renally-dose medications, avoid nephrotoxins, hydrate with IVF, and repeat chem panel in am    3. Hyperglycemia - Serum glucose is 307 on admission without hx of DM  - Check A1c, monitor CBGs, start linagliptin and sliding-scale Novolog    8.  Hypertension  - SBP 90s to low 100s in ED and antihypertensives will be held for now     DVT prophylaxis: sq heparin  Code Status: Full  Family Communication: Discussed with patient  Disposition Plan:  Patient is from: Home  Anticipated d/c is to: TBD  Anticipated d/c date is: 01/11/20  Patient currently: Has new supplemental O2 requirement and AKI  Consults called: None  Admission status: Inpatient     Vianne Bulls, MD [facility]  01/11/2020, 5:41 AM

## 2020-01-11 NOTE — ED Notes (Signed)
Pt up to bedside commode independently. Able to stand to wash hands and return to bed. Increased shortness or breath and coughing upon exertion .    Pt placed back on 4L NS with sats 79%, rising to 94% with O2.  Strong non-productive cough with labored respirations

## 2020-01-11 NOTE — Progress Notes (Signed)
Micheal Graham  YCX:[id number] DOB: Nov 04, 1955 DOA: 01/10/2020 PCP: [name], MD    Brief Narrative:  64 year old with a history of HTN and CKD stage IIIa who presented to the ED with shortness of breath, cough, diarrhea, and loss of appetite.  He is fully vaccinated against COVID-19.  He reported a recent history of upper respiratory symptoms and malaise starting 9/25 at which time he had a negative rapid Covid antigen test as well as a Covid PCR that was negative (9/26).  He was treated with prednisone and azithromycin and his symptoms had nearly resolved when he began to feel worse again on 01/06/2020.  He was retested for Covid 10/4 and reports that this was positive.  Over the days prior to his presentation he developed worsening loose stools, loss of appetite, dry cough, and dyspnea.  In the ED he was found to be saturating in the 80s on room air and was slightly tachypneic.  Chest x-ray demonstrated infiltrate in the left mid and lower lung fields.  Significant Events:  10/4 outpatient Covid test reportedly positive  Date of Positive COVID Test:  10/4 -outpatient - not confirmable   Vaccination Status: Fully vaccinated Levan Hurst)  COVID-19 specific Treatment: Remdesivir 10/7 > Steroid 10/6 >   Antimicrobials:  Azithro 10/6 > Rocephin 10/6 >  DVT prophylaxis: Subcu heparin   Subjective: F/U review completed.   Assessment & Plan:  COVID Pneumonia - acute hypoxic respiratory failure (81% on RA in ED)  Recent Labs  Lab 01/11/20 0445  DDIMER 0.47  FERRITIN 1,330*  CRP 24.2*  PROCALCITON 7.46    Elevated procalcitonin Procalcitonin 7.46 at time of admission which is suggestive of a bacterial component - cover with empiric antibiotic for bacterial pneumonia and avoid Actemra/baricitinib for now   Acute kidney injury on CKD stage IIIa Baseline creatinine 1.46  Recent Labs  Lab 01/10/20 2219  CREATININE 3.09*    Hyponatremia Likely simply dehydration  related -volume expand and monitor  Hyperglycemia - newly diagnosed DM CBG 307 at time of admission - no prior history of DM - A1c 8.3 c/w true DM  HTN Blood pressure stable at present  Code Status: FULL CODE Family Communication:  Status is: Inpatient  Remains inpatient appropriate because:Inpatient level of care appropriate due to severity of illness   Dispo: The patient is from: Home              Anticipated d/c is to: Home              Anticipated d/c date is: 3 days              Patient currently is not medically stable to d/c.   Consultants:  none  Objective: Blood pressure (!) 104/56, pulse 99, temperature 99.4 F (37.4 C), temperature source Oral, resp. rate (!) 29, SpO2 92 %.  Intake/Output Summary (Last 24 hours) at 01/11/2020 0754 Last data filed at 01/11/2020 0640 Gross per 24 hour  Intake 600 ml  Output --  Net 600 ml   There were no vitals filed for this visit.  Examination: Pt examined by [facility] MD earlier this day.   CBC: Recent Labs  Lab 01/10/20 2219  WBC 11.3*  HGB 12.2*  HCT 36.7*  MCV 94.8  PLT 497*   Basic Metabolic Panel: Recent Labs  Lab 01/10/20 2219  NA 129*  K 4.5  CL 99  CO2 17*  GLUCOSE 307*  BUN 52*  CREATININE 3.09*  CALCIUM 8.0*  GFR: CrCl cannot be calculated (Unknown ideal weight.).  Liver Function Tests: No results for input(s): AST, ALT, ALKPHOS, BILITOT, PROT, ALBUMIN in the last 168 hours. No results for input(s): LIPASE, AMYLASE in the last 168 hours. No results for input(s): AMMONIA in the last 168 hours.  Coagulation Profile: No results for input(s): INR, PROTIME in the last 168 hours.  Cardiac Enzymes: No results for input(s): CKTOTAL, CKMB, CKMBINDEX, TROPONINI in the last 168 hours.  HbA1C: Hgb A1c MFr Bld  Date/Time Value Ref Range Status  01/11/2020 06:33 AM 8.3 (H) 4.8 - 5.6 % Final    Comment:    (NOTE) Pre diabetes:          5.7%-6.4%  Diabetes:              >6.4%  Glycemic control for    <7.0% adults with diabetes   11/30/2016 10:44 AM 5.9 4.6 - 6.5 % Final    Comment:    Glycemic Control Guidelines for People with Diabetes:Non Diabetic:  <6%Goal of Therapy: <7%Additional Action Suggested:  >8%     CBG: No results for input(s): GLUCAP in the last 168 hours.  No results found for this or any previous visit (from the past 240 hour(s)).   Scheduled Meds: . [START ON 01/12/2020] dexamethasone (DECADRON) injection  6 mg Intravenous Q24H  . heparin  5,000 Units Subcutaneous Q8H  . insulin aspart  0-5 Units Subcutaneous QHS  . insulin aspart  0-6 Units Subcutaneous TID WC  . linagliptin  5 mg Oral Daily  . sodium chloride flush  3 mL Intravenous Q12H   Continuous Infusions: . sodium chloride 100 mL/hr at 01/11/20 0610  . azithromycin 500 mg (01/11/20 0623)  . cefTRIAXone (ROCEPHIN)  IV Stopped (01/11/20 0640)  . [START ON 01/12/2020] remdesivir 100 mg in NS 100 mL    . remdesivir 200 mg in sodium chloride 0.9% 250 mL IVPB       LOS: 0 days   Cherene Altes, MD [facility] Office  [phone] Pager - Text Page per Amion  If 7PM-7AM, please contact night-coverage per Amion 01/11/2020, 7:54 AM

## 2020-01-11 NOTE — ED Notes (Signed)
Glucometer not crossing over blood sugar is 316

## 2020-01-11 NOTE — TOC Initial Note (Signed)
Transition of Care [facility]) - Initial/Assessment Note    Patient Details  Name: BRANON SABINE MRN: [id number] Date of Birth: Mar 19, 1956  Transition of [facility] CM/SW Contact:    Verdell Carmine, RN Phone Number: 01/11/2020, 4:21 PM  Clinical Narrative:                 Admitted with worsening COVID symptoms, history of CKD III and HPTN.  History of smoking .  Currently on 4LPM [place] , will continue to follow for oxygen requirement , will likely need oxygen for home use, may need Home Health, CM will continue to follow for needs.   Expected Discharge Plan: [place] Barriers to Discharge: Continued Medical Work up   Patient Goals and CMS Choice        Expected Discharge Plan and Services Expected Discharge Plan: [place]       Living arrangements for the past 2 months: Single Family Home                                      Prior Living Arrangements/Services Living arrangements for the past 2 months: Single Family Home Lives with:: Spouse Patient language and need for interpreter reviewed:: Yes        Need for Family Participation in Patient Care: Yes (Comment) Care giver support system in place?: Yes (comment)   Criminal Activity/Legal Involvement Pertinent to Current Situation/Hospitalization: No - Comment as needed  Activities of Daily Living      Permission Sought/Granted                  Emotional Assessment       Orientation: : Oriented to Self, Oriented to Place, Oriented to  Time, Oriented to Situation Alcohol / Substance Use: Not Applicable Psych Involvement: No (comment)  Admission diagnosis:  Pneumonia [J18.9] Acute respiratory failure with hypoxia ([place]) [J96.01] COVID-19 [U07.1] Patient Active Problem List   Diagnosis Date Noted  . Pneumonia 01/11/2020  . Acute respiratory failure with hypoxia ([place]) 01/11/2020  . Acute renal failure superimposed on stage 3a chronic kidney disease ([place])  01/11/2020  . Chest pain 01/11/2020  . Osteopenia 01/13/2019  . Celiac disease 11/07/2018  . Low testosterone 11/30/2016  . Vitamin D deficiency 11/30/2016  . Carpal tunnel syndrome of right wrist 02/14/2014  . Hereditary and idiopathic peripheral neuropathy 02/14/2014  . Cognitive impairment, mild, so stated 01/23/2014  . Ataxia 01/23/2014  . 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 Hypertension 09/09/2011  . Kidney stones 09/09/2011  . Anxiety 09/09/2011  . Colon polyps 09/09/2011  . Basal cell cancer 09/09/2011   PCP:  [name], MD Pharmacy:   [id number] [place] [id number]: [phone]: [phone]    Social Determinants of Health (SDOH) Interventions    Readmission Risk Interventions No flowsheet data found.

## 2020-01-12 DIAGNOSIS — U071 COVID-19: Secondary | ICD-10-CM | POA: Diagnosis not present

## 2020-01-12 DIAGNOSIS — N179 Acute kidney failure, unspecified: Secondary | ICD-10-CM | POA: Diagnosis not present

## 2020-01-12 DIAGNOSIS — J189 Pneumonia, unspecified organism: Secondary | ICD-10-CM | POA: Diagnosis not present

## 2020-01-12 DIAGNOSIS — J9601 Acute respiratory failure with hypoxia: Secondary | ICD-10-CM | POA: Diagnosis not present

## 2020-01-12 LAB — CBC WITH DIFFERENTIAL/PLATELET
Abs Immature Granulocytes: 0.06 10*3/uL (ref 0.00–0.07)
Basophils Absolute: 0 10*3/uL (ref 0.0–0.1)
Basophils Relative: 0 %
Eosinophils Absolute: 0 10*3/uL (ref 0.0–0.5)
Eosinophils Relative: 0 %
HCT: 32.8 % — ABNORMAL LOW (ref 39.0–52.0)
Hemoglobin: 10.8 g/dL — ABNORMAL LOW (ref 13.0–17.0)
Immature Granulocytes: 1 %
Lymphocytes Relative: 8 %
Lymphs Abs: 0.7 10*3/uL (ref 0.7–4.0)
MCH: 31.7 pg (ref 26.0–34.0)
MCHC: 32.9 g/dL (ref 30.0–36.0)
MCV: 96.2 fL (ref 80.0–100.0)
Monocytes Absolute: 0.3 10*3/uL (ref 0.1–1.0)
Monocytes Relative: 3 %
Neutro Abs: 7.4 10*3/uL (ref 1.7–7.7)
Neutrophils Relative %: 88 %
Platelets: 120 10*3/uL — ABNORMAL LOW (ref 150–400)
RBC: 3.41 MIL/uL — ABNORMAL LOW (ref 4.22–5.81)
RDW: 16.8 % — ABNORMAL HIGH (ref 11.5–15.5)
WBC: 8.4 10*3/uL (ref 4.0–10.5)
nRBC: 0 % (ref 0.0–0.2)

## 2020-01-12 LAB — MAGNESIUM: Magnesium: 2.2 mg/dL (ref 1.7–2.4)

## 2020-01-12 LAB — COMPREHENSIVE METABOLIC PANEL
ALT: 33 U/L (ref 0–44)
AST: 35 U/L (ref 15–41)
Albumin: 2.3 g/dL — ABNORMAL LOW (ref 3.5–5.0)
Alkaline Phosphatase: 58 U/L (ref 38–126)
Anion gap: 11 (ref 5–15)
BUN: 60 mg/dL — ABNORMAL HIGH (ref 8–23)
CO2: 18 mmol/L — ABNORMAL LOW (ref 22–32)
Calcium: 8.2 mg/dL — ABNORMAL LOW (ref 8.9–10.3)
Chloride: 103 mmol/L (ref 98–111)
Creatinine, Ser: 2.11 mg/dL — ABNORMAL HIGH (ref 0.61–1.24)
GFR calc non Af Amer: 32 mL/min — ABNORMAL LOW (ref >60)
Glucose, Bld: 396 mg/dL — ABNORMAL HIGH (ref 70–99)
Potassium: 5.5 mmol/L — ABNORMAL HIGH (ref 3.5–5.1)
Sodium: 132 mmol/L — ABNORMAL LOW (ref 135–145)
Total Bilirubin: 0.6 mg/dL (ref 0.3–1.2)
Total Protein: 6.4 g/dL — ABNORMAL LOW (ref 6.5–8.1)

## 2020-01-12 LAB — GLUCOSE, CAPILLARY
Glucose-Capillary: 396 mg/dL — ABNORMAL HIGH (ref 70–99)
Glucose-Capillary: 379 mg/dL — ABNORMAL HIGH (ref 70–99)
Glucose-Capillary: 390 mg/dL — ABNORMAL HIGH (ref 70–99)
Glucose-Capillary: 255 mg/dL — ABNORMAL HIGH (ref 70–99)

## 2020-01-12 LAB — C-REACTIVE PROTEIN: CRP: 21.8 mg/dL — ABNORMAL HIGH (ref <1.0)

## 2020-01-12 LAB — D-DIMER, QUANTITATIVE: D-Dimer, Quant: 1.13 ug/mL-FEU — ABNORMAL HIGH (ref 0.00–0.50)

## 2020-01-12 LAB — FERRITIN: Ferritin: 1670 ng/mL — ABNORMAL HIGH (ref 24–336)

## 2020-01-12 MED ORDER — INSULIN ASPART 100 UNIT/ML ~~LOC~~ SOLN
4.0000 [IU] | Freq: Three times a day (TID) | SUBCUTANEOUS | Status: DC
  Administered 2020-01-12 (×2): 4 [IU] via SUBCUTANEOUS

## 2020-01-12 MED ORDER — INSULIN GLARGINE 100 UNIT/ML ~~LOC~~ SOLN
14.0000 [IU] | Freq: Two times a day (BID) | SUBCUTANEOUS | Status: DC
  Administered 2020-01-12 (×2): 14 [IU] via SUBCUTANEOUS
  Filled 2020-01-12 (×4): qty 0.14

## 2020-01-12 NOTE — Progress Notes (Signed)
Micheal Graham  [id number]:[id number] DOB: 05/15/1955 DOA: 01/10/2020 PCP: [name], MD    Brief Narrative:  64 year old with a history of HTN and CKD stage IIIa who presented to the ED with shortness of breath, cough, diarrhea, and loss of appetite.  He is fully vaccinated against COVID-19.  He reported a recent history of upper respiratory symptoms and malaise starting 9/25 at which time he had a negative rapid Covid antigen test as well as a Covid PCR that was negative (9/26).  He was treated with prednisone and azithromycin and his symptoms had nearly resolved when he began to feel worse again on 01/06/2020.  He was retested for Covid 10/4 and reports that this was positive.  Over the days prior to his presentation he developed worsening loose stools, loss of appetite, dry cough, and dyspnea.  In the ED he was found to be saturating in the 80s on room air and was slightly tachypneic.  Chest x-ray demonstrated infiltrate in the left mid and lower lung fields.  Significant Events:  10/4 outpatient Covid test reportedly positive  Date of Positive COVID Test:  10/4 -outpatient - not confirmable   Vaccination Status: Fully vaccinated Levan Hurst)  COVID-19 specific Treatment: Remdesivir 10/7 > Steroid 10/6 >   Antimicrobials:  Azithro 10/6 > Rocephin 10/6 >  DVT prophylaxis: Subcu heparin   Subjective: Oxygen saturations holding steady in low to mid 90s.  Afebrile with vitals otherwise stable.  Inflammatory markers remain elevated with some trending upward still.  The patient states he feels appreciably better today.  He denies nausea or vomiting.  Assessment & Plan:  COVID Pneumonia - acute hypoxic respiratory failure (81% on RA in ED) Continue remdesivir and steroid -not utilizing immunomodulators due to concern for concomitant significant bacterial pneumonia -appears that he may be stabilizing clinically though it is too early to be certain just yet  Recent Labs  Lab  01/11/20 0445 01/11/20 0633 01/12/20 0406  DDIMER 0.47  --  1.13*  FERRITIN 1,330*  --  1,670*  CRP 24.2*  --  21.8*  ALT  --  37 33  PROCALCITON 7.46  --   --     Elevated procalcitonin Procalcitonin 7.46 at time of admission which is suggestive of a bacterial component - covered with empiric antibiotic for bacterial pneumonia  Acute kidney injury on CKD stage IIIa Baseline creatinine 1.46 -creatinine improving with volume resuscitation -follow trend  Recent Labs  Lab 01/10/20 2219 01/12/20 0406  CREATININE 3.09* 2.11*    Hyponatremia Likely simply dehydration related -trending upward with volume resuscitation -continue to follow  Hyperkalemia Due to acute kidney injury -monitor trend now without specific treatment  Hyperglycemia - newly diagnosed severely uncontrolled DM2 no prior history of known DM - A1c 8.3 c/w true DM but I am hesitant to formalize this diagnosis in the setting of acute Covid - CBGs have proven to be very difficult to control over the last 24 hours but are now trending downward -further adjustments to be made in insulin regimen today  HTN Blood pressure stable at present  Code Status: FULL CODE Family Communication: Lengthy discussion with daughter via telephone Status is: Inpatient  Remains inpatient appropriate because:Inpatient level of care appropriate due to severity of illness   Dispo: The patient is from: Home              Anticipated d/c is to: Home              Anticipated d/c date  is: 3 days              Patient currently is not medically stable to d/c.   Consultants:  none  Objective: Blood pressure (!) 108/57, pulse 80, temperature 97.8 F (36.6 C), temperature source Oral, resp. rate 17, weight 105.3 kg, SpO2 92 %.  Intake/Output Summary (Last 24 hours) at 01/12/2020 0950 Last data filed at 01/12/2020 0929 Gross per 24 hour  Intake 243 ml  Output --  Net 243 ml   Filed Weights   01/11/20 1532  Weight: 105.3 kg     Examination: General: No acute respiratory distress Lungs: Fine diffuse crackles bilaterally without wheezing Cardiovascular: Regular rate and rhythm without murmur gallop or rub normal S1 and S2 Abdomen: Nontender, nondistended, soft, bowel sounds positive, no rebound, no ascites, no appreciable mass Extremities: No significant cyanosis, clubbing, or edema bilateral lower extremities    CBC: Recent Labs  Lab 01/10/20 2219 01/12/20 0406  WBC 11.3* 8.4  NEUTROABS  --  7.4  HGB 12.2* 10.8*  HCT 36.7* 32.8*  MCV 94.8 96.2  PLT 131* 962*   Basic Metabolic Panel: Recent Labs  Lab 01/10/20 2219 01/12/20 0406  NA 129* 132*  K 4.5 5.5*  CL 99 103  CO2 17* 18*  GLUCOSE 307* 396*  BUN 52* 60*  CREATININE 3.09* 2.11*  CALCIUM 8.0* 8.2*  MG  --  2.2   GFR: Estimated Creatinine Clearance: 43.2 mL/min (A) (by C-G formula based on SCr of 2.11 mg/dL (H)).  Liver Function Tests: Recent Labs  Lab 01/11/20 0633 01/12/20 0406  AST 58* 35  ALT 37 33  ALKPHOS 71 58  BILITOT 0.6 0.6  PROT 7.3 6.4*  ALBUMIN 3.0* 2.3*    HbA1C: Hgb A1c MFr Bld  Date/Time Value Ref Range Status  01/11/2020 06:33 AM 8.3 (H) 4.8 - 5.6 % Final    Comment:    (NOTE) Pre diabetes:          5.7%-6.4%  Diabetes:              >6.4%  Glycemic control for   <7.0% adults with diabetes   11/30/2016 10:44 AM 5.9 4.6 - 6.5 % Final    Comment:    Glycemic Control Guidelines for People with Diabetes:Non Diabetic:  <6%Goal of Therapy: <7%Additional Action Suggested:  >8%     CBG: Recent Labs  Lab 01/11/20 0837 01/11/20 1223 01/11/20 1703 01/11/20 2119 01/12/20 0739  GLUCAP 316* 411* 406* 330* 396*    Recent Results (from the past 240 hour(s))  Blood Culture (routine x 2)     Status: None (Preliminary result)   Collection Time: 01/11/20  4:45 AM   Specimen: BLOOD  Result Value Ref Range Status   Specimen Description BLOOD SITE NOT SPECIFIED  Final   Special Requests   Final    BOTTLES  DRAWN AEROBIC AND ANAEROBIC Blood Culture adequate volume   Culture   Final    NO GROWTH 1 DAY Performed at [facility], [place] [address], [place], [place] [address]    Report Status PENDING  Incomplete  Respiratory Panel by RT PCR (Flu A&B, Covid) - Nasopharyngeal Swab     Status: Abnormal   Collection Time: 01/11/20  5:27 AM   Specimen: Nasopharyngeal Swab  Result Value Ref Range Status   SARS Coronavirus 2 by RT PCR POSITIVE (A) NEGATIVE Final    Comment: emailed L. Berdik RN 8:30 01/11/20 ([email or web address]) (NOTE) SARS-CoV-2 target nucleic acids are DETECTED.  SARS-CoV-2 RNA is generally detectable in upper respiratory specimens  during the acute phase of infection. Positive results are indicative of the presence of the identified virus, but do not rule out bacterial infection or co-infection with other pathogens not detected by the test. Clinical correlation with patient history and other diagnostic information is necessary to determine patient infection status. The expected result is Negative.  Fact Sheet for Patients:  PinkCheek.be  Fact Sheet for Healthcare Providers: GravelBags.it  This test is not yet approved or cleared by the [place] FDA and  has been authorized for detection and/or diagnosis of SARS-CoV-2 by FDA under an Emergency Use Authorization (EUA).  This EUA will remain in effect (meaning this test can be used) for the duration of  the COVID-19 d eclaration under Section 564(b)(1) of the Act, 21 U.S.C. section 360bbb-3(b)(1), unless the authorization is terminated or revoked sooner.      Influenza A by PCR NEGATIVE NEGATIVE Final   Influenza B by PCR NEGATIVE NEGATIVE Final    Comment: (NOTE) The Xpert Xpress SARS-CoV-2/FLU/RSV assay is intended as an aid in  the diagnosis of influenza from Nasopharyngeal swab specimens and  should not be used as a sole basis for treatment. Nasal washings and   aspirates are unacceptable for Xpert Xpress SARS-CoV-2/FLU/RSV  testing.  Fact Sheet for Patients: PinkCheek.be  Fact Sheet for Healthcare Providers: GravelBags.it  This test is not yet approved or cleared by the [place] FDA and  has been authorized for detection and/or diagnosis of SARS-CoV-2 by  FDA under an Emergency Use Authorization (EUA). This EUA will remain  in effect (meaning this test can be used) for the duration of the  Covid-19 declaration under Section 564(b)(1) of the Act, 21  U.S.C. section 360bbb-3(b)(1), unless the authorization is  terminated or revoked. Performed at [facility], [place] [address] [place], [place] [address]   Blood Culture (routine x 2)     Status: None (Preliminary result)   Collection Time: 01/11/20  6:33 AM   Specimen: BLOOD  Result Value Ref Range Status   Specimen Description BLOOD SITE NOT SPECIFIED  Final   Special Requests   Final    BOTTLES DRAWN AEROBIC AND ANAEROBIC Blood Culture adequate volume   Culture   Final    NO GROWTH < 24 HOURS Performed at [facility], [place] [address], [place], [place] [address]    Report Status PENDING  Incomplete     Scheduled Meds: . calcium-vitamin D  1 tablet Oral Daily  . cholecalciferol  2,000 Units Oral Daily  . heparin  5,000 Units Subcutaneous Q8H  . insulin aspart  0-20 Units Subcutaneous TID WC  . insulin aspart  0-5 Units Subcutaneous QHS  . insulin glargine  14 Units Subcutaneous QHS  . linagliptin  5 mg Oral Daily  . loratadine  10 mg Oral Daily  . magnesium oxide  400 mg Oral Daily  . methylPREDNISolone (SOLU-MEDROL) injection  60 mg Intravenous Q12H  . sertraline  100 mg Oral Daily  . sodium chloride flush  3 mL Intravenous Q12H   Continuous Infusions: . sodium chloride 100 mL/hr at 01/11/20 1957  . azithromycin 500 mg (01/12/20 0843)  . cefTRIAXone (ROCEPHIN)  IV 2 g (01/12/20 4680)  . remdesivir 100  mg in NS 100 mL       LOS: 1 day   Cherene Altes, MD [facility] Office  [phone] Pager - Text Page per Amion  If 7PM-7AM,  please contact night-coverage per Amion 01/12/2020, 9:50 AM

## 2020-01-13 DIAGNOSIS — J189 Pneumonia, unspecified organism: Secondary | ICD-10-CM | POA: Diagnosis not present

## 2020-01-13 DIAGNOSIS — N179 Acute kidney failure, unspecified: Secondary | ICD-10-CM | POA: Diagnosis not present

## 2020-01-13 DIAGNOSIS — J9601 Acute respiratory failure with hypoxia: Secondary | ICD-10-CM | POA: Diagnosis not present

## 2020-01-13 DIAGNOSIS — U071 COVID-19: Secondary | ICD-10-CM | POA: Diagnosis not present

## 2020-01-13 LAB — COMPREHENSIVE METABOLIC PANEL
ALT: 31 U/L (ref 0–44)
AST: 27 U/L (ref 15–41)
Albumin: 2.4 g/dL — ABNORMAL LOW (ref 3.5–5.0)
Alkaline Phosphatase: 57 U/L (ref 38–126)
Anion gap: 11 (ref 5–15)
BUN: 56 mg/dL — ABNORMAL HIGH (ref 8–23)
CO2: 18 mmol/L — ABNORMAL LOW (ref 22–32)
Calcium: 8.1 mg/dL — ABNORMAL LOW (ref 8.9–10.3)
Chloride: 107 mmol/L (ref 98–111)
Creatinine, Ser: 1.58 mg/dL — ABNORMAL HIGH (ref 0.61–1.24)
GFR, Estimated: 45 mL/min — ABNORMAL LOW (ref >60)
Glucose, Bld: 333 mg/dL — ABNORMAL HIGH (ref 70–99)
Potassium: 5 mmol/L (ref 3.5–5.1)
Sodium: 136 mmol/L (ref 135–145)
Total Bilirubin: 0.5 mg/dL (ref 0.3–1.2)
Total Protein: 6.5 g/dL (ref 6.5–8.1)

## 2020-01-13 LAB — GLUCOSE, CAPILLARY
Glucose-Capillary: 335 mg/dL — ABNORMAL HIGH (ref 70–99)
Glucose-Capillary: 351 mg/dL — ABNORMAL HIGH (ref 70–99)
Glucose-Capillary: 275 mg/dL — ABNORMAL HIGH (ref 70–99)
Glucose-Capillary: 179 mg/dL — ABNORMAL HIGH (ref 70–99)

## 2020-01-13 LAB — CBC WITH DIFFERENTIAL/PLATELET
Abs Immature Granulocytes: 0.09 10*3/uL — ABNORMAL HIGH (ref 0.00–0.07)
Basophils Absolute: 0 10*3/uL (ref 0.0–0.1)
Basophils Relative: 0 %
Eosinophils Absolute: 0 10*3/uL (ref 0.0–0.5)
Eosinophils Relative: 0 %
HCT: 32.9 % — ABNORMAL LOW (ref 39.0–52.0)
Hemoglobin: 10.9 g/dL — ABNORMAL LOW (ref 13.0–17.0)
Immature Granulocytes: 1 %
Lymphocytes Relative: 5 %
Lymphs Abs: 0.5 10*3/uL — ABNORMAL LOW (ref 0.7–4.0)
MCH: 31.7 pg (ref 26.0–34.0)
MCHC: 33.1 g/dL (ref 30.0–36.0)
MCV: 95.6 fL (ref 80.0–100.0)
Monocytes Absolute: 0.4 10*3/uL (ref 0.1–1.0)
Monocytes Relative: 3 %
Neutro Abs: 9.3 10*3/uL — ABNORMAL HIGH (ref 1.7–7.7)
Neutrophils Relative %: 91 %
Platelets: 160 10*3/uL (ref 150–400)
RBC: 3.44 MIL/uL — ABNORMAL LOW (ref 4.22–5.81)
RDW: 16.8 % — ABNORMAL HIGH (ref 11.5–15.5)
WBC: 10.2 10*3/uL (ref 4.0–10.5)
nRBC: 0 % (ref 0.0–0.2)

## 2020-01-13 LAB — C-REACTIVE PROTEIN: CRP: 11.4 mg/dL — ABNORMAL HIGH (ref <1.0)

## 2020-01-13 LAB — FERRITIN: Ferritin: 1464 ng/mL — ABNORMAL HIGH (ref 24–336)

## 2020-01-13 LAB — D-DIMER, QUANTITATIVE: D-Dimer, Quant: 1.67 ug/mL-FEU — ABNORMAL HIGH (ref 0.00–0.50)

## 2020-01-13 MED ORDER — INSULIN GLARGINE 100 UNIT/ML ~~LOC~~ SOLN
20.0000 [IU] | Freq: Two times a day (BID) | SUBCUTANEOUS | Status: DC
  Administered 2020-01-13 – 2020-01-14 (×2): 20 [IU] via SUBCUTANEOUS
  Filled 2020-01-13 (×5): qty 0.2

## 2020-01-13 MED ORDER — INSULIN ASPART 100 UNIT/ML ~~LOC~~ SOLN
8.0000 [IU] | Freq: Three times a day (TID) | SUBCUTANEOUS | Status: DC
  Administered 2020-01-13 – 2020-01-14 (×3): 8 [IU] via SUBCUTANEOUS

## 2020-01-13 NOTE — Progress Notes (Signed)
Micheal Graham  PQZ:[id number] DOB: June 15, 1955 DOA: 01/10/2020 PCP: [name], MD    Brief Narrative:  31LK with a history of HTN and CKD stage IIIa who presented to the ED with shortness of breath, cough, diarrhea, and loss of appetite.  He is fully vaccinated against COVID-19.  He reported a recent history of upper respiratory symptoms and malaise starting 9/25 at which time he had a negative rapid Covid antigen test as well as a Covid PCR that was negative (9/26).  He was treated with prednisone and azithromycin and his symptoms had nearly resolved when he began to feel worse again on 01/06/2020.  He was retested for Covid 10/4 and reports that this was positive.  Over the days prior to his presentation he developed worsening loose stools, loss of appetite, dry cough, and dyspnea.  In the ED he was found to be saturating in the 80s on room air and was slightly tachypneic.  Chest x-ray demonstrated infiltrate in the left mid and lower lung fields.  Significant Events:  10/4 outpatient Covid test reportedly positive  Date of Positive COVID Test:  10/4 -outpatient - not confirmable   Vaccination Status: Fully vaccinated Levan Hurst)  COVID-19 specific Treatment: Remdesivir 10/7 > Steroid 10/6 >   Antimicrobials:  Azithro 10/7 > Rocephin 10/6 >  DVT prophylaxis: Subcu heparin   Subjective: Afebrile.  Vital signs stable.  Saturations 90% on 11 L high flow nasal cannula.  Inflammatory markers trending downward for most part.  Sitting up in a bedside chair.  States that he actually feels a little bit better at this time.  Denies chest pain nausea vomiting abdominal pain.  Has displayed poor appetite.  Assessment & Plan:  COVID Pneumonia - acute hypoxic respiratory failure (81% on RA in ED) Continue remdesivir and steroid - not utilizing other immunomodulators due to concern for concomitant significant bacterial pneumonia -oxygen requirement has increased slightly over the last 24  hours but respirations are comfortable at time of exam  Recent Labs  Lab 01/11/20 0445 01/11/20 0633 01/12/20 0406 01/13/20 0406  DDIMER 0.47  --  1.13* 1.67*  FERRITIN 1,330*  --  1,670* 1,464*  CRP 24.2*  --  21.8* 11.4*  ALT  --  37 33 31  PROCALCITON 7.46  --   --   --     Elevated procalcitonin - bacterial pneumonia Procalcitonin 7.46 at time of admission which is suggestive of a bacterial component - covered with empiric antibiotic for bacterial pneumonia -recheck procalcitonin in a.m.  Acute kidney injury on CKD stage IIIa Baseline creatinine 1.46 -creatinine improving with volume resuscitation and now approaching baseline -very poor oral intake therefore we will continue IV support  Recent Labs  Lab 01/10/20 2219 01/12/20 0406 01/13/20 0406  CREATININE 3.09* 2.11* 1.58*    Hyponatremia Likely simply dehydration related -corrected with volume resuscitation  Hyperkalemia Due to acute kidney injury -corrected with improved renal function  Hyperglycemia - newly diagnosed severely uncontrolled DM2 no prior history of known DM - A1c 8.3 c/w true DM but I am hesitant to formalize this diagnosis in the setting of acute Covid - CBGs remain elevated above goal -adjust treatment again today and follow  HTN Blood pressure stable at present  Code Status: FULL CODE Family Communication: [place] Status is: Inpatient  Remains inpatient appropriate because:Inpatient level of care appropriate due to severity of illness   Dispo: The patient is from: Home  Anticipated d/c is to: Home              Anticipated d/c date is: 3 days              Patient currently is not medically stable to d/c.   Consultants:  none  Objective: Blood pressure 116/67, pulse 71, temperature 98.4 F (36.9 C), temperature source Oral, resp. rate 20, weight 105.3 kg, SpO2 90 %.  Intake/Output Summary (Last 24 hours) at 01/13/2020 0949 Last data filed at 01/12/2020 1800 Gross per  24 hour  Intake 710 ml  Output --  Net 710 ml   Filed Weights   01/11/20 1532  Weight: 105.3 kg    Examination: General: No acute respiratory distress Lungs: Fine diffuse crackles bilaterally - no wheezing  Cardiovascular: Regular rate and rhythm without murmur  Abdomen: NT/ND, soft, bs+, no mass  Extremities: No signif edema B LE     CBC: Recent Labs  Lab 01/10/20 2219 01/12/20 0406 01/13/20 0406  WBC 11.3* 8.4 10.2  NEUTROABS  --  7.4 9.3*  HGB 12.2* 10.8* 10.9*  HCT 36.7* 32.8* 32.9*  MCV 94.8 96.2 95.6  PLT 131* 120* 403   Basic Metabolic Panel: Recent Labs  Lab 01/10/20 2219 01/12/20 0406 01/13/20 0406  NA 129* 132* 136  K 4.5 5.5* 5.0  CL 99 103 107  CO2 17* 18* 18*  GLUCOSE 307* 396* 333*  BUN 52* 60* 56*  CREATININE 3.09* 2.11* 1.58*  CALCIUM 8.0* 8.2* 8.1*  MG  --  2.2  --    GFR: Estimated Creatinine Clearance: 57.7 mL/min (A) (by C-G formula based on SCr of 1.58 mg/dL (H)).  Liver Function Tests: Recent Labs  Lab 01/11/20 0633 01/12/20 0406 01/13/20 0406  AST 58* 35 27  ALT 37 33 31  ALKPHOS 71 58 57  BILITOT 0.6 0.6 0.5  PROT 7.3 6.4* 6.5  ALBUMIN 3.0* 2.3* 2.4*    HbA1C: Hgb A1c MFr Bld  Date/Time Value Ref Range Status  01/11/2020 06:33 AM 8.3 (H) 4.8 - 5.6 % Final    Comment:    (NOTE) Pre diabetes:          5.7%-6.4%  Diabetes:              >6.4%  Glycemic control for   <7.0% adults with diabetes   11/30/2016 10:44 AM 5.9 4.6 - 6.5 % Final    Comment:    Glycemic Control Guidelines for People with Diabetes:Non Diabetic:  <6%Goal of Therapy: <7%Additional Action Suggested:  >8%     CBG: Recent Labs  Lab 01/12/20 0739 01/12/20 1208 01/12/20 1541 01/12/20 2131 01/13/20 0756  GLUCAP 396* 379* 390* 255* 335*    Recent Results (from the past 240 hour(s))  Blood Culture (routine x 2)     Status: None (Preliminary result)   Collection Time: 01/11/20  4:45 AM   Specimen: BLOOD  Result Value Ref Range Status    Specimen Description BLOOD SITE NOT SPECIFIED  Final   Special Requests   Final    BOTTLES DRAWN AEROBIC AND ANAEROBIC Blood Culture adequate volume   Culture   Final    NO GROWTH 2 DAYS Performed at [facility], [place] [address], [place], [place] [address]    Report Status PENDING  Incomplete  Respiratory Panel by RT PCR (Flu A&B, Covid) - Nasopharyngeal Swab     Status: Abnormal   Collection Time: 01/11/20  5:27 AM   Specimen: Nasopharyngeal Swab  Result Value Ref Range Status   SARS Coronavirus 2 by RT PCR POSITIVE (A) NEGATIVE Final    Comment: emailed L. Berdik RN 8:30 01/11/20 ([email or web address]) (NOTE) SARS-CoV-2 target nucleic acids are DETECTED.  SARS-CoV-2 RNA is generally detectable in upper respiratory specimens  during the acute phase of infection. Positive results are indicative of the presence of the identified virus, but do not rule out bacterial infection or co-infection with other pathogens not detected by the test. Clinical correlation with patient history and other diagnostic information is necessary to determine patient infection status. The expected result is Negative.  Fact Sheet for Patients:  PinkCheek.be  Fact Sheet for Healthcare Providers: GravelBags.it  This test is not yet approved or cleared by the [place] FDA and  has been authorized for detection and/or diagnosis of SARS-CoV-2 by FDA under an Emergency Use Authorization (EUA).  This EUA will remain in effect (meaning this test can be used) for the duration of  the COVID-19 d eclaration under Section 564(b)(1) of the Act, 21 U.S.C. section 360bbb-3(b)(1), unless the authorization is terminated or revoked sooner.      Influenza A by PCR NEGATIVE NEGATIVE Final   Influenza B by PCR NEGATIVE NEGATIVE Final    Comment: (NOTE) The Xpert Xpress SARS-CoV-2/FLU/RSV assay is intended as an aid in  the diagnosis of influenza from Nasopharyngeal  swab specimens and  should not be used as a sole basis for treatment. Nasal washings and  aspirates are unacceptable for Xpert Xpress SARS-CoV-2/FLU/RSV  testing.  Fact Sheet for Patients: PinkCheek.be  Fact Sheet for Healthcare Providers: GravelBags.it  This test is not yet approved or cleared by the [place] FDA and  has been authorized for detection and/or diagnosis of SARS-CoV-2 by  FDA under an Emergency Use Authorization (EUA). This EUA will remain  in effect (meaning this test can be used) for the duration of the  Covid-19 declaration under Section 564(b)(1) of the Act, 21  U.S.C. section 360bbb-3(b)(1), unless the authorization is  terminated or revoked. Performed at [facility], [place] [address], [place], [place] [address]   Blood Culture (routine x 2)     Status: None (Preliminary result)   Collection Time: 01/11/20  6:33 AM   Specimen: BLOOD  Result Value Ref Range Status   Specimen Description BLOOD SITE NOT SPECIFIED  Final   Special Requests   Final    BOTTLES DRAWN AEROBIC AND ANAEROBIC Blood Culture adequate volume   Culture   Final    NO GROWTH 2 DAYS Performed at [facility] [address], [place], [place] [address]    Report Status PENDING  Incomplete     Scheduled Meds: . calcium-vitamin D  1 tablet Oral Daily  . cholecalciferol  2,000 Units Oral Daily  . heparin  5,000 Units Subcutaneous Q8H  . insulin aspart  0-20 Units Subcutaneous TID WC  . insulin aspart  0-5 Units Subcutaneous QHS  . insulin aspart  4 Units Subcutaneous TID WC  . insulin glargine  14 Units Subcutaneous BID  . linagliptin  5 mg Oral Daily  . magnesium oxide  400 mg Oral Daily  . methylPREDNISolone (SOLU-MEDROL) injection  60 mg Intravenous Q12H  . sertraline  100 mg Oral Daily  . sodium chloride flush  3 mL Intravenous Q12H   Continuous Infusions: . sodium chloride 100 mL/hr at 01/13/20 0728  .  azithromycin 500 mg (01/13/20 0731)  . cefTRIAXone (ROCEPHIN)  IV 2 g (01/13/20 0455)  .  remdesivir 100 mg in NS 100 mL 100 mg (01/12/20 1037)     LOS: 2 days   Cherene Altes, MD [facility] Office  [phone] Pager - Text Page per Amion  If 7PM-7AM, please contact night-coverage per Amion 01/13/2020, 9:49 AM

## 2020-01-13 NOTE — Progress Notes (Signed)
Pt. Presents as irritiable this morning.  He stated he does NOT want [place] to deliver his breakfast this morning, and last night he requested no dinner.  He was very upset that they delivered his dinner although he said no.  Did not deliver his morning breakfast tray.

## 2020-01-14 ENCOUNTER — Inpatient Hospital Stay (HOSPITAL_COMMUNITY): Payer: BC Managed Care – PPO

## 2020-01-14 ENCOUNTER — Other Ambulatory Visit: Payer: Self-pay

## 2020-01-14 DIAGNOSIS — U071 COVID-19: Secondary | ICD-10-CM | POA: Diagnosis not present

## 2020-01-14 DIAGNOSIS — J189 Pneumonia, unspecified organism: Secondary | ICD-10-CM | POA: Diagnosis not present

## 2020-01-14 DIAGNOSIS — J1282 Pneumonia due to coronavirus disease 2019: Secondary | ICD-10-CM

## 2020-01-14 DIAGNOSIS — J9601 Acute respiratory failure with hypoxia: Secondary | ICD-10-CM | POA: Diagnosis not present

## 2020-01-14 DIAGNOSIS — N179 Acute kidney failure, unspecified: Secondary | ICD-10-CM | POA: Diagnosis not present

## 2020-01-14 LAB — CBC WITH DIFFERENTIAL/PLATELET
Abs Immature Granulocytes: 0.07 10*3/uL (ref 0.00–0.07)
Basophils Absolute: 0 10*3/uL (ref 0.0–0.1)
Basophils Relative: 0 %
Eosinophils Absolute: 0 10*3/uL (ref 0.0–0.5)
Eosinophils Relative: 0 %
HCT: 32.7 % — ABNORMAL LOW (ref 39.0–52.0)
Hemoglobin: 10.6 g/dL — ABNORMAL LOW (ref 13.0–17.0)
Immature Granulocytes: 1 %
Lymphocytes Relative: 7 %
Lymphs Abs: 0.5 10*3/uL — ABNORMAL LOW (ref 0.7–4.0)
MCH: 31.5 pg (ref 26.0–34.0)
MCHC: 32.4 g/dL (ref 30.0–36.0)
MCV: 97.3 fL (ref 80.0–100.0)
Monocytes Absolute: 0.4 10*3/uL (ref 0.1–1.0)
Monocytes Relative: 5 %
Neutro Abs: 6 10*3/uL (ref 1.7–7.7)
Neutrophils Relative %: 87 %
Platelets: 174 10*3/uL (ref 150–400)
RBC: 3.36 MIL/uL — ABNORMAL LOW (ref 4.22–5.81)
RDW: 16.6 % — ABNORMAL HIGH (ref 11.5–15.5)
WBC: 6.9 10*3/uL (ref 4.0–10.5)
nRBC: 0 % (ref 0.0–0.2)

## 2020-01-14 LAB — GLUCOSE, CAPILLARY
Glucose-Capillary: 202 mg/dL — ABNORMAL HIGH (ref 70–99)
Glucose-Capillary: 231 mg/dL — ABNORMAL HIGH (ref 70–99)
Glucose-Capillary: 191 mg/dL — ABNORMAL HIGH (ref 70–99)
Glucose-Capillary: 248 mg/dL — ABNORMAL HIGH (ref 70–99)

## 2020-01-14 LAB — COMPREHENSIVE METABOLIC PANEL
ALT: 50 U/L — ABNORMAL HIGH (ref 0–44)
AST: 56 U/L — ABNORMAL HIGH (ref 15–41)
Albumin: 2.3 g/dL — ABNORMAL LOW (ref 3.5–5.0)
Alkaline Phosphatase: 56 U/L (ref 38–126)
Anion gap: 8 (ref 5–15)
BUN: 49 mg/dL — ABNORMAL HIGH (ref 8–23)
CO2: 23 mmol/L (ref 22–32)
Calcium: 8.4 mg/dL — ABNORMAL LOW (ref 8.9–10.3)
Chloride: 108 mmol/L (ref 98–111)
Creatinine, Ser: 1.42 mg/dL — ABNORMAL HIGH (ref 0.61–1.24)
GFR, Estimated: 51 mL/min — ABNORMAL LOW (ref >60)
Glucose, Bld: 194 mg/dL — ABNORMAL HIGH (ref 70–99)
Potassium: 5.3 mmol/L — ABNORMAL HIGH (ref 3.5–5.1)
Sodium: 139 mmol/L (ref 135–145)
Total Bilirubin: 0.3 mg/dL (ref 0.3–1.2)
Total Protein: 6.1 g/dL — ABNORMAL LOW (ref 6.5–8.1)

## 2020-01-14 LAB — PROCALCITONIN: Procalcitonin: 0.71 ng/mL

## 2020-01-14 LAB — FERRITIN: Ferritin: 1215 ng/mL — ABNORMAL HIGH (ref 24–336)

## 2020-01-14 LAB — C-REACTIVE PROTEIN: CRP: 5.9 mg/dL — ABNORMAL HIGH (ref <1.0)

## 2020-01-14 LAB — D-DIMER, QUANTITATIVE: D-Dimer, Quant: 1.53 ug/mL-FEU — ABNORMAL HIGH (ref 0.00–0.50)

## 2020-01-14 MED ORDER — INSULIN ASPART 100 UNIT/ML ~~LOC~~ SOLN
10.0000 [IU] | Freq: Three times a day (TID) | SUBCUTANEOUS | Status: DC
  Administered 2020-01-14 – 2020-01-17 (×11): 10 [IU] via SUBCUTANEOUS

## 2020-01-14 MED ORDER — INSULIN GLARGINE 100 UNIT/ML ~~LOC~~ SOLN
22.0000 [IU] | Freq: Two times a day (BID) | SUBCUTANEOUS | Status: DC
  Administered 2020-01-14 – 2020-01-16 (×4): 22 [IU] via SUBCUTANEOUS
  Filled 2020-01-14 (×6): qty 0.22

## 2020-01-14 NOTE — Progress Notes (Signed)
Micheal Graham  FBP:[id number] DOB: 1955/06/23 DOA: 01/10/2020 PCP: [name], MD    Brief Narrative:  80UM with a history of HTN and CKD stage IIIa who presented to the ED with shortness of breath, cough, diarrhea, and loss of appetite.  He is fully vaccinated against COVID-19.  He reported a recent history of upper respiratory symptoms and malaise starting 9/25 at which time he had a negative rapid Covid antigen test as well as a Covid PCR that was negative (9/26).  He was treated with prednisone and azithromycin and his symptoms had nearly resolved when he began to feel worse again on 01/06/2020.  He was retested for Covid 10/4 and reports that this was positive.  Over the days prior to his presentation he developed worsening loose stools, loss of appetite, dry cough, and dyspnea.  In the ED he was found to be saturating in the 80s on room air and was slightly tachypneic.  Chest x-ray demonstrated infiltrate in the left mid and lower lung fields.  Significant Events:  10/4 outpatient Covid test reportedly positive  Date of Positive COVID Test:  10/4 -outpatient - not confirmable   Vaccination Status: Fully vaccinated Levan Hurst)  COVID-19 specific Treatment: Remdesivir 10/7 > 10/11 Steroid 10/6 >   Antimicrobials:  Azithro 10/7 > Rocephin 10/6 >  DVT prophylaxis: Subcu heparin   Subjective: Afebrile.  Vital signs stable.  Oxygen saturations holding in the mid 90s on 11 L conventional nasal cannula.  No new complaints.  Denies chest pain nausea vomiting abdominal pain.  Assessment & Plan:  COVID Pneumonia - acute hypoxic respiratory failure (81% on RA in ED) Continue remdesivir and steroid - not utilizing other immunomodulators due to concern for concomitant significant bacterial pneumonia -oxygen requirement stable over last 24 hours -inflammatory markers trending downward  Recent Labs  Lab 01/11/20 0445 01/11/20 0633 01/12/20 0406 01/13/20 0406 01/14/20 0500  DDIMER  0.47  --  1.13* 1.67* 1.53*  FERRITIN 1,330*  --  1,670* 1,464* 1,215*  CRP 24.2*  --  21.8* 11.4* 5.9*  ALT  --  37 33 31 50*  PROCALCITON 7.46  --   --   --  0.71    Bacterial pneumonia Procalcitonin 7.46 at time of admission which is suggestive of a bacterial component - covered with empiric antibiotic for bacterial pneumonia -clinically appears to be stabilizing and procalcitonin is trending downward  Acute kidney injury on CKD stage IIIa Baseline creatinine 1.46 -creatinine has improved to his baseline -very poor oral intake therefore we will continue IV support  Recent Labs  Lab 01/10/20 2219 01/12/20 0406 01/13/20 0406 01/14/20 0500  CREATININE 3.09* 2.11* 1.58* 1.42*    Mild transaminitis Likely due to combination of Covid as well as Remdesivir -follow trend  Hyponatremia Likely simply dehydration related -corrected with volume resuscitation  Hyperkalemia Due to acute kidney injury -corrected with improved renal function  Hyperglycemia - newly diagnosed severely uncontrolled DM2 no prior history of known DM - A1c 8.3 c/w true DM but I am hesitant to formalize this diagnosis in the setting of acute Covid - CBGs improved significantly -gently adjust treatment further today  HTN Blood pressure stable at present  Code Status: FULL CODE Family Communication: Discussed care at length with daughter via phone Status is: Inpatient  Remains inpatient appropriate because:Inpatient level of care appropriate due to severity of illness   Dispo: The patient is from: Home  Anticipated d/c is to: Home              Anticipated d/c date is: 3 days              Patient currently is not medically stable to d/c.   Consultants:  none  Objective: Blood pressure (!) 141/72, pulse 62, temperature 98.3 F (36.8 C), temperature source Oral, resp. rate 20, weight 105.3 kg, SpO2 96 %.  Intake/Output Summary (Last 24 hours) at 01/14/2020 0954 Last data filed at  01/13/2020 2202 Gross per 24 hour  Intake 446 ml  Output 1 ml  Net 445 ml   Filed Weights   01/11/20 1532  Weight: 105.3 kg    Examination: General: No acute respiratory distress - alert and comfortable  Lungs: Fine diffuse crackles bilaterally w/o change  Cardiovascular: Regular rate and rhythm - no M or rub  Abdomen: NT/ND, soft, bs+, no mass  Extremities: No signif edema B LE     CBC: Recent Labs  Lab 01/12/20 0406 01/13/20 0406 01/14/20 0500  WBC 8.4 10.2 6.9  NEUTROABS 7.4 9.3* 6.0  HGB 10.8* 10.9* 10.6*  HCT 32.8* 32.9* 32.7*  MCV 96.2 95.6 97.3  PLT 120* 160 035   Basic Metabolic Panel: Recent Labs  Lab 01/12/20 0406 01/13/20 0406 01/14/20 0500  NA 132* 136 139  K 5.5* 5.0 5.3*  CL 103 107 108  CO2 18* 18* 23  GLUCOSE 396* 333* 194*  BUN 60* 56* 49*  CREATININE 2.11* 1.58* 1.42*  CALCIUM 8.2* 8.1* 8.4*  MG 2.2  --   --    GFR: Estimated Creatinine Clearance: 64.2 mL/min (A) (by C-G formula based on SCr of 1.42 mg/dL (H)).  Liver Function Tests: Recent Labs  Lab 01/11/20 5974 01/12/20 0406 01/13/20 0406 01/14/20 0500  AST 58* 35 27 56*  ALT 37 33 31 50*  ALKPHOS 71 58 57 56  BILITOT 0.6 0.6 0.5 0.3  PROT 7.3 6.4* 6.5 6.1*  ALBUMIN 3.0* 2.3* 2.4* 2.3*    HbA1C: Hgb A1c MFr Bld  Date/Time Value Ref Range Status  01/11/2020 06:33 AM 8.3 (H) 4.8 - 5.6 % Final    Comment:    (NOTE) Pre diabetes:          5.7%-6.4%  Diabetes:              >6.4%  Glycemic control for   <7.0% adults with diabetes   11/30/2016 10:44 AM 5.9 4.6 - 6.5 % Final    Comment:    Glycemic Control Guidelines for People with Diabetes:Non Diabetic:  <6%Goal of Therapy: <7%Additional Action Suggested:  >8%     CBG: Recent Labs  Lab 01/13/20 0756 01/13/20 1209 01/13/20 1752 01/13/20 2153 01/14/20 0752  GLUCAP 335* 351* 275* 179* 202*    Recent Results (from the past 240 hour(s))  Blood Culture (routine x 2)     Status: None (Preliminary result)    Collection Time: 01/11/20  4:45 AM   Specimen: BLOOD  Result Value Ref Range Status   Specimen Description BLOOD SITE NOT SPECIFIED  Final   Special Requests   Final    BOTTLES DRAWN AEROBIC AND ANAEROBIC Blood Culture adequate volume   Culture   Final    NO GROWTH 2 DAYS Performed at [facility], [place] [address], [place], [place] [address]    Report Status PENDING  Incomplete  Respiratory Panel by RT PCR (Flu A&B, Covid) - Nasopharyngeal Swab     Status:  Abnormal   Collection Time: 01/11/20  5:27 AM   Specimen: Nasopharyngeal Swab  Result Value Ref Range Status   SARS Coronavirus 2 by RT PCR POSITIVE (A) NEGATIVE Final    Comment: emailed L. Berdik RN 8:30 01/11/20 ([email or web address]) (NOTE) SARS-CoV-2 target nucleic acids are DETECTED.  SARS-CoV-2 RNA is generally detectable in upper respiratory specimens  during the acute phase of infection. Positive results are indicative of the presence of the identified virus, but do not rule out bacterial infection or co-infection with other pathogens not detected by the test. Clinical correlation with patient history and other diagnostic information is necessary to determine patient infection status. The expected result is Negative.  Fact Sheet for Patients:  PinkCheek.be  Fact Sheet for Healthcare Providers: GravelBags.it  This test is not yet approved or cleared by the [place] FDA and  has been authorized for detection and/or diagnosis of SARS-CoV-2 by FDA under an Emergency Use Authorization (EUA).  This EUA will remain in effect (meaning this test can be used) for the duration of  the COVID-19 d eclaration under Section 564(b)(1) of the Act, 21 U.S.C. section 360bbb-3(b)(1), unless the authorization is terminated or revoked sooner.      Influenza A by PCR NEGATIVE NEGATIVE Final   Influenza B by PCR NEGATIVE NEGATIVE Final    Comment: (NOTE) The Xpert Xpress  SARS-CoV-2/FLU/RSV assay is intended as an aid in  the diagnosis of influenza from Nasopharyngeal swab specimens and  should not be used as a sole basis for treatment. Nasal washings and  aspirates are unacceptable for Xpert Xpress SARS-CoV-2/FLU/RSV  testing.  Fact Sheet for Patients: PinkCheek.be  Fact Sheet for Healthcare Providers: GravelBags.it  This test is not yet approved or cleared by the [place] FDA and  has been authorized for detection and/or diagnosis of SARS-CoV-2 by  FDA under an Emergency Use Authorization (EUA). This EUA will remain  in effect (meaning this test can be used) for the duration of the  Covid-19 declaration under Section 564(b)(1) of the Act, 21  U.S.C. section 360bbb-3(b)(1), unless the authorization is  terminated or revoked. Performed at [facility], [place] [address], [place], [place] [address]   Blood Culture (routine x 2)     Status: None (Preliminary result)   Collection Time: 01/11/20  6:33 AM   Specimen: BLOOD  Result Value Ref Range Status   Specimen Description BLOOD SITE NOT SPECIFIED  Final   Special Requests   Final    BOTTLES DRAWN AEROBIC AND ANAEROBIC Blood Culture adequate volume   Culture   Final    NO GROWTH 2 DAYS Performed at [facility] [address], [place], [place] [address]    Report Status PENDING  Incomplete     Scheduled Meds: . calcium-vitamin D  1 tablet Oral Daily  . cholecalciferol  2,000 Units Oral Daily  . heparin  5,000 Units Subcutaneous Q8H  . insulin aspart  0-20 Units Subcutaneous TID WC  . insulin aspart  0-5 Units Subcutaneous QHS  . insulin aspart  8 Units Subcutaneous TID WC  . insulin glargine  20 Units Subcutaneous BID  . linagliptin  5 mg Oral Daily  . magnesium oxide  400 mg Oral Daily  . methylPREDNISolone (SOLU-MEDROL) injection  60 mg Intravenous Q12H  . sertraline  100 mg Oral Daily  . sodium chloride flush  3 mL  Intravenous Q12H   Continuous Infusions: . sodium chloride 60 mL/hr at 01/13/20 1100  . azithromycin  500 mg (01/14/20 0827)  . cefTRIAXone (ROCEPHIN)  IV 2 g (01/14/20 0603)  . remdesivir 100 mg in NS 100 mL 100 mg (01/14/20 0835)     LOS: 3 days   Cherene Altes, MD [facility] Office  [phone] Pager - Text Page per Amion  If 7PM-7AM, please contact night-coverage per Amion 01/14/2020, 9:54 AM

## 2020-01-15 ENCOUNTER — Inpatient Hospital Stay (HOSPITAL_COMMUNITY): Payer: BC Managed Care – PPO

## 2020-01-15 DIAGNOSIS — J189 Pneumonia, unspecified organism: Secondary | ICD-10-CM | POA: Diagnosis not present

## 2020-01-15 DIAGNOSIS — R7989 Other specified abnormal findings of blood chemistry: Secondary | ICD-10-CM

## 2020-01-15 DIAGNOSIS — U071 COVID-19: Secondary | ICD-10-CM

## 2020-01-15 DIAGNOSIS — N179 Acute kidney failure, unspecified: Secondary | ICD-10-CM | POA: Diagnosis not present

## 2020-01-15 DIAGNOSIS — J9601 Acute respiratory failure with hypoxia: Secondary | ICD-10-CM | POA: Diagnosis not present

## 2020-01-15 LAB — CBC WITH DIFFERENTIAL/PLATELET
Abs Immature Granulocytes: 0.12 10*3/uL — ABNORMAL HIGH (ref 0.00–0.07)
Basophils Absolute: 0 10*3/uL (ref 0.0–0.1)
Basophils Relative: 0 %
Eosinophils Absolute: 0 10*3/uL (ref 0.0–0.5)
Eosinophils Relative: 0 %
HCT: 34.3 % — ABNORMAL LOW (ref 39.0–52.0)
Hemoglobin: 11.1 g/dL — ABNORMAL LOW (ref 13.0–17.0)
Immature Granulocytes: 2 %
Lymphocytes Relative: 10 %
Lymphs Abs: 0.6 10*3/uL — ABNORMAL LOW (ref 0.7–4.0)
MCH: 31.4 pg (ref 26.0–34.0)
MCHC: 32.4 g/dL (ref 30.0–36.0)
MCV: 97.2 fL (ref 80.0–100.0)
Monocytes Absolute: 0.4 10*3/uL (ref 0.1–1.0)
Monocytes Relative: 6 %
Neutro Abs: 5.4 10*3/uL (ref 1.7–7.7)
Neutrophils Relative %: 82 %
Platelets: 176 10*3/uL (ref 150–400)
RBC: 3.53 MIL/uL — ABNORMAL LOW (ref 4.22–5.81)
RDW: 16.2 % — ABNORMAL HIGH (ref 11.5–15.5)
WBC: 6.5 10*3/uL (ref 4.0–10.5)
nRBC: 0 % (ref 0.0–0.2)

## 2020-01-15 LAB — GLUCOSE, CAPILLARY
Glucose-Capillary: 168 mg/dL — ABNORMAL HIGH (ref 70–99)
Glucose-Capillary: 195 mg/dL — ABNORMAL HIGH (ref 70–99)
Glucose-Capillary: 259 mg/dL — ABNORMAL HIGH (ref 70–99)
Glucose-Capillary: 153 mg/dL — ABNORMAL HIGH (ref 70–99)

## 2020-01-15 LAB — PROCALCITONIN: Procalcitonin: 0.24 ng/mL

## 2020-01-15 LAB — COMPREHENSIVE METABOLIC PANEL
ALT: 62 U/L — ABNORMAL HIGH (ref 0–44)
AST: 42 U/L — ABNORMAL HIGH (ref 15–41)
Albumin: 2.4 g/dL — ABNORMAL LOW (ref 3.5–5.0)
Alkaline Phosphatase: 66 U/L (ref 38–126)
Anion gap: 9 (ref 5–15)
BUN: 43 mg/dL — ABNORMAL HIGH (ref 8–23)
CO2: 24 mmol/L (ref 22–32)
Calcium: 8.3 mg/dL — ABNORMAL LOW (ref 8.9–10.3)
Chloride: 105 mmol/L (ref 98–111)
Creatinine, Ser: 1.42 mg/dL — ABNORMAL HIGH (ref 0.61–1.24)
GFR, Estimated: 51 mL/min — ABNORMAL LOW (ref >60)
Glucose, Bld: 209 mg/dL — ABNORMAL HIGH (ref 70–99)
Potassium: 5.6 mmol/L — ABNORMAL HIGH (ref 3.5–5.1)
Sodium: 138 mmol/L (ref 135–145)
Total Bilirubin: 0.4 mg/dL (ref 0.3–1.2)
Total Protein: 6.3 g/dL — ABNORMAL LOW (ref 6.5–8.1)

## 2020-01-15 LAB — D-DIMER, QUANTITATIVE: D-Dimer, Quant: 1.83 ug/mL-FEU — ABNORMAL HIGH (ref 0.00–0.50)

## 2020-01-15 LAB — FERRITIN: Ferritin: 1078 ng/mL — ABNORMAL HIGH (ref 24–336)

## 2020-01-15 LAB — C-REACTIVE PROTEIN: CRP: 2.9 mg/dL — ABNORMAL HIGH (ref <1.0)

## 2020-01-15 MED ORDER — SODIUM ZIRCONIUM CYCLOSILICATE 10 G PO PACK
10.0000 g | PACK | Freq: Once | ORAL | Status: AC
  Administered 2020-01-15: 10 g via ORAL
  Filled 2020-01-15: qty 1

## 2020-01-15 MED ORDER — ENOXAPARIN SODIUM 60 MG/0.6ML ~~LOC~~ SOLN
50.0000 mg | SUBCUTANEOUS | Status: DC
  Administered 2020-01-15 – 2020-01-18 (×4): 50 mg via SUBCUTANEOUS
  Filled 2020-01-15 (×4): qty 0.6

## 2020-01-15 NOTE — Progress Notes (Addendum)
AKI is improving and CrCl is now ~65 ml/min. Ok to change SQ heparin to Lovenox 0.21m/kg/day per Dr MThereasa Solo Also ok to give Lokelma 10g PO x1 for k+ 5.6  MOnnie Boer PharmD, BBarney AAHIVP, CPP Infectious Disease Pharmacist 01/15/2020 8:48 AM

## 2020-01-15 NOTE — Progress Notes (Signed)
Micheal Graham  HAL:[id number] DOB: [birth year] DOA: 01/10/2020 PCP: [name], MD    Brief Narrative:  71ZH with a history of HTN and CKD stage IIIa who presented to the ED with shortness of breath, cough, diarrhea, and loss of appetite.  He is fully vaccinated against COVID-19.  He reported a recent history of upper respiratory symptoms and malaise starting 9/25 at which time he had a negative rapid Covid antigen test as well as a Covid PCR that was negative (9/26).  He was treated with prednisone and azithromycin and his symptoms had nearly resolved when he began to feel worse again on 01/06/2020.  He was retested for Covid 10/4 and reports that this was positive.  Over the days prior to his presentation he developed worsening loose stools, loss of appetite, dry cough, and dyspnea.  In the ED he was found to be saturating in the 80s on room air and was slightly tachypneic.  Chest x-ray demonstrated infiltrate in the left mid and lower lung fields.  Significant Events:  10/4 outpatient Covid test reportedly positive  Date of Positive COVID Test:  10/4 -outpatient - not confirmable   Vaccination Status: Fully vaccinated Levan Hurst)  COVID-19 specific Treatment: Remdesivir 10/7 > 10/11 Steroid 10/6 >   Antimicrobials:  Azithro 10/7 > Rocephin 10/6 >  DVT prophylaxis: Subcu heparin   Subjective: Afebrile.  Blood pressure trending up.  Requiring 12 L nasal cannula support but keeping sats at 95-98% range.  D-dimer trending upward but other inflammatory markers trending downward. Feels he is breathing more easily today. No cp, n/v, or abdom pain.   Assessment & Plan:  COVID Pneumonia - acute hypoxic respiratory failure (81% on RA in ED) Continue remdesivir and steroid - not utilizing other immunomodulators due to concern for concomitant significant bacterial pneumonia -oxygen requirement stable over last 24 hours/beginning to improve -inflammatory markers trending downward  Recent  Labs  Lab 01/11/20 0445 01/11/20 0633 01/12/20 0406 01/13/20 0406 01/14/20 0500 01/15/20 0415  DDIMER 0.47  --  1.13* 1.67* 1.53* 1.83*  FERRITIN 1,330*  --  1,670* 1,464* 1,215* 1,078*  CRP 24.2*  --  21.8* 11.4* 5.9* 2.9*  ALT  --  37 33 31 50* 62*  PROCALCITON 7.46  --   --   --  0.71 0.24    Climbing d-dimer  With AKI will try to avoid IV contrast - check venous duplex to r/o DVTs - change to full tx dose if continues to trend upward   Bacterial pneumonia Procalcitonin 7.46 at time of admission which is suggestive of a bacterial component - covered with empiric antibiotic for bacterial pneumonia -clinically appears to be stabilizing and procalcitonin is trending downward -stop antibiotics after 5 days of treatment  Acute kidney injury on CKD stage IIIa Baseline creatinine 1.46 -creatinine has improved to his baseline -very poor oral intake therefore we will continue IV support  Recent Labs  Lab 01/10/20 2219 01/12/20 0406 01/13/20 0406 01/14/20 0500 01/15/20 0415  CREATININE 3.09* 2.11* 1.58* 1.42* 1.42*    Mild transaminitis Likely due to combination of Covid as well as Remdesivir -stable  Hyponatremia Likely simply dehydration related -corrected with volume resuscitation  Hyperkalemia Due to acute kidney injury -dosed with Lokelma x1 today  Hyperglycemia - newly diagnosed severely uncontrolled DM2 no prior history of known DM - A1c 8.3 c/w true DM but I am hesitant to formalize this diagnosis in the setting of acute Covid - CBGs improved significantly - follow without change  today  HTN Blood pressure stable at present  Code Status: FULL CODE Family Communication: Discussed care at length with daughter via phone Status is: Inpatient  Remains inpatient appropriate because:Inpatient level of care appropriate due to severity of illness   Dispo: The patient is from: Home              Anticipated d/c is to: Home              Anticipated d/c date is: 2 days               Patient currently is not medically stable to d/c.   Consultants:  none  Objective: Blood pressure (!) 154/81, pulse 61, temperature 97.9 F (36.6 C), temperature source Oral, resp. rate 20, height 6' 0.01" (1.829 m), weight 105.3 kg, SpO2 95 %.  Intake/Output Summary (Last 24 hours) at 01/15/2020 0930 Last data filed at 01/14/2020 1500 Gross per 24 hour  Intake 240 ml  Output 2 ml  Net 238 ml   Filed Weights   01/11/20 1532 01/14/20 1143  Weight: 105.3 kg 105.3 kg    Examination: General: No acute respiratory distress Lungs: Fine diffuse crackles B - improved air movement  Cardiovascular: Regular rate and rhythm - no M or rub  Abdomen: NT/ND, soft, bs+, no mass  Extremities: No signif edema B lower extremities     CBC: Recent Labs  Lab 01/13/20 0406 01/14/20 0500 01/15/20 0415  WBC 10.2 6.9 6.5  NEUTROABS 9.3* 6.0 5.4  HGB 10.9* 10.6* 11.1*  HCT 32.9* 32.7* 34.3*  MCV 95.6 97.3 97.2  PLT 160 174 161   Basic Metabolic Panel: Recent Labs  Lab 01/12/20 0406 01/12/20 0406 01/13/20 0406 01/14/20 0500 01/15/20 0415  NA 132*   < > 136 139 138  K 5.5*   < > 5.0 5.3* 5.6*  CL 103   < > 107 108 105  CO2 18*   < > 18* 23 24  GLUCOSE 396*   < > 333* 194* 209*  BUN 60*   < > 56* 49* 43*  CREATININE 2.11*   < > 1.58* 1.42* 1.42*  CALCIUM 8.2*   < > 8.1* 8.4* 8.3*  MG 2.2  --   --   --   --    < > = values in this interval not displayed.   GFR: Estimated Creatinine Clearance: 64.2 mL/min (A) (by C-G formula based on SCr of 1.42 mg/dL (H)).  Liver Function Tests: Recent Labs  Lab 01/12/20 0406 01/13/20 0406 01/14/20 0500 01/15/20 0415  AST 35 27 56* 42*  ALT 33 31 50* 62*  ALKPHOS 58 57 56 66  BILITOT 0.6 0.5 0.3 0.4  PROT 6.4* 6.5 6.1* 6.3*  ALBUMIN 2.3* 2.4* 2.3* 2.4*    HbA1C: Hgb A1c MFr Bld  Date/Time Value Ref Range Status  01/11/2020 06:33 AM 8.3 (H) 4.8 - 5.6 % Final    Comment:    (NOTE) Pre diabetes:           5.7%-6.4%  Diabetes:              >6.4%  Glycemic control for   <7.0% adults with diabetes   11/30/2016 10:44 AM 5.9 4.6 - 6.5 % Final    Comment:    Glycemic Control Guidelines for People with Diabetes:Non Diabetic:  <6%Goal of Therapy: <7%Additional Action Suggested:  >8%     CBG: Recent Labs  Lab 01/14/20 0752 01/14/20 1148 01/14/20 1619 01/14/20 2131 01/15/20 0755  GLUCAP 202* 231* 191* 248* 168*    Recent Results (from the past 240 hour(s))  Blood Culture (routine x 2)     Status: None (Preliminary result)   Collection Time: 01/11/20  4:45 AM   Specimen: BLOOD  Result Value Ref Range Status   Specimen Description BLOOD SITE NOT SPECIFIED  Final   Special Requests   Final    BOTTLES DRAWN AEROBIC AND ANAEROBIC Blood Culture adequate volume   Culture   Final    NO GROWTH 4 DAYS Performed at [facility], [address] [place], [place] [address]    Report Status PENDING  Incomplete  Respiratory Panel by RT PCR (Flu A&B, Covid) - Nasopharyngeal Swab     Status: Abnormal   Collection Time: 01/11/20  5:27 AM   Specimen: Nasopharyngeal Swab  Result Value Ref Range Status   SARS Coronavirus 2 by RT PCR POSITIVE (A) NEGATIVE Final    Comment: emailed L. Berdik RN 8:30 01/11/20 ([email or web address]) (NOTE) SARS-CoV-2 target nucleic acids are DETECTED.  SARS-CoV-2 RNA is generally detectable in upper respiratory specimens  during the acute phase of infection. Positive results are indicative of the presence of the identified virus, but do not rule out bacterial infection or co-infection with other pathogens not detected by the test. Clinical correlation with patient history and other diagnostic information is necessary to determine patient infection status. The expected result is Negative.  Fact Sheet for Patients:  PinkCheek.be  Fact Sheet for Healthcare Providers: GravelBags.it  This test is not yet approved or  cleared by the [place] FDA and  has been authorized for detection and/or diagnosis of SARS-CoV-2 by FDA under an Emergency Use Authorization (EUA).  This EUA will remain in effect (meaning this test can be used) for the duration of  the COVID-19 d eclaration under Section 564(b)(1) of the Act, 21 U.S.C. section 360bbb-3(b)(1), unless the authorization is terminated or revoked sooner.      Influenza A by PCR NEGATIVE NEGATIVE Final   Influenza B by PCR NEGATIVE NEGATIVE Final    Comment: (NOTE) The Xpert Xpress SARS-CoV-2/FLU/RSV assay is intended as an aid in  the diagnosis of influenza from Nasopharyngeal swab specimens and  should not be used as a sole basis for treatment. Nasal washings and  aspirates are unacceptable for Xpert Xpress SARS-CoV-2/FLU/RSV  testing.  Fact Sheet for Patients: PinkCheek.be  Fact Sheet for Healthcare Providers: GravelBags.it  This test is not yet approved or cleared by the [place] FDA and  has been authorized for detection and/or diagnosis of SARS-CoV-2 by  FDA under an Emergency Use Authorization (EUA). This EUA will remain  in effect (meaning this test can be used) for the duration of the  Covid-19 declaration under Section 564(b)(1) of the Act, 21  U.S.C. section 360bbb-3(b)(1), unless the authorization is  terminated or revoked. Performed at [facility], [place] [address] [place], [place] [address]   Blood Culture (routine x 2)     Status: None (Preliminary result)   Collection Time: 01/11/20  6:33 AM   Specimen: BLOOD  Result Value Ref Range Status   Specimen Description BLOOD SITE NOT SPECIFIED  Final   Special Requests   Final    BOTTLES DRAWN AEROBIC AND ANAEROBIC Blood Culture adequate volume   Culture   Final    NO GROWTH 4 DAYS Performed at [facility], [address] [place],  [address]    Report Status PENDING  Incomplete  Scheduled  Meds: . calcium-vitamin D  1 tablet Oral Daily  . cholecalciferol  2,000 Units Oral Daily  . enoxaparin (LOVENOX) injection  50 mg Subcutaneous Q24H  . insulin aspart  0-20 Units Subcutaneous TID WC  . insulin aspart  0-5 Units Subcutaneous QHS  . insulin aspart  10 Units Subcutaneous TID WC  . insulin glargine  22 Units Subcutaneous BID  . linagliptin  5 mg Oral Daily  . magnesium oxide  400 mg Oral Daily  . methylPREDNISolone (SOLU-MEDROL) injection  60 mg Intravenous Q12H  . sertraline  100 mg Oral Daily  . sodium chloride flush  3 mL Intravenous Q12H  . sodium zirconium cyclosilicate  10 g Oral Once   Continuous Infusions: . azithromycin Stopped (01/14/20 0927)     LOS: 4 days   Cherene Altes, MD [facility] Office  [phone] Pager - Text Page per Shea Evans  If 7PM-7AM, please contact night-coverage per Amion 01/15/2020, 9:30 AM

## 2020-01-16 DIAGNOSIS — J189 Pneumonia, unspecified organism: Secondary | ICD-10-CM | POA: Diagnosis not present

## 2020-01-16 DIAGNOSIS — N179 Acute kidney failure, unspecified: Secondary | ICD-10-CM | POA: Diagnosis not present

## 2020-01-16 DIAGNOSIS — J9601 Acute respiratory failure with hypoxia: Secondary | ICD-10-CM | POA: Diagnosis not present

## 2020-01-16 DIAGNOSIS — U071 COVID-19: Secondary | ICD-10-CM | POA: Diagnosis not present

## 2020-01-16 LAB — CBC WITH DIFFERENTIAL/PLATELET
Abs Immature Granulocytes: 0.12 10*3/uL — ABNORMAL HIGH (ref 0.00–0.07)
Basophils Absolute: 0 10*3/uL (ref 0.0–0.1)
Basophils Relative: 0 %
Eosinophils Absolute: 0 10*3/uL (ref 0.0–0.5)
Eosinophils Relative: 0 %
HCT: 35.4 % — ABNORMAL LOW (ref 39.0–52.0)
Hemoglobin: 11.7 g/dL — ABNORMAL LOW (ref 13.0–17.0)
Immature Granulocytes: 2 %
Lymphocytes Relative: 8 %
Lymphs Abs: 0.5 10*3/uL — ABNORMAL LOW (ref 0.7–4.0)
MCH: 31.9 pg (ref 26.0–34.0)
MCHC: 33.1 g/dL (ref 30.0–36.0)
MCV: 96.5 fL (ref 80.0–100.0)
Monocytes Absolute: 0.3 10*3/uL (ref 0.1–1.0)
Monocytes Relative: 5 %
Neutro Abs: 5.8 10*3/uL (ref 1.7–7.7)
Neutrophils Relative %: 85 %
Platelets: 202 10*3/uL (ref 150–400)
RBC: 3.67 MIL/uL — ABNORMAL LOW (ref 4.22–5.81)
RDW: 15.9 % — ABNORMAL HIGH (ref 11.5–15.5)
WBC: 6.7 10*3/uL (ref 4.0–10.5)
nRBC: 0 % (ref 0.0–0.2)

## 2020-01-16 LAB — CULTURE, BLOOD (ROUTINE X 2)
Culture: NO GROWTH
Culture: NO GROWTH
Special Requests: ADEQUATE
Special Requests: ADEQUATE

## 2020-01-16 LAB — COMPREHENSIVE METABOLIC PANEL
ALT: 55 U/L — ABNORMAL HIGH (ref 0–44)
AST: 25 U/L (ref 15–41)
Albumin: 2.5 g/dL — ABNORMAL LOW (ref 3.5–5.0)
Alkaline Phosphatase: 67 U/L (ref 38–126)
Anion gap: 11 (ref 5–15)
BUN: 40 mg/dL — ABNORMAL HIGH (ref 8–23)
CO2: 24 mmol/L (ref 22–32)
Calcium: 8.6 mg/dL — ABNORMAL LOW (ref 8.9–10.3)
Chloride: 103 mmol/L (ref 98–111)
Creatinine, Ser: 1.36 mg/dL — ABNORMAL HIGH (ref 0.61–1.24)
GFR, Estimated: 54 mL/min — ABNORMAL LOW (ref >60)
Glucose, Bld: 155 mg/dL — ABNORMAL HIGH (ref 70–99)
Potassium: 5.3 mmol/L — ABNORMAL HIGH (ref 3.5–5.1)
Sodium: 138 mmol/L (ref 135–145)
Total Bilirubin: 0.4 mg/dL (ref 0.3–1.2)
Total Protein: 6.5 g/dL (ref 6.5–8.1)

## 2020-01-16 LAB — GLUCOSE, CAPILLARY
Glucose-Capillary: 132 mg/dL — ABNORMAL HIGH (ref 70–99)
Glucose-Capillary: 129 mg/dL — ABNORMAL HIGH (ref 70–99)
Glucose-Capillary: 127 mg/dL — ABNORMAL HIGH (ref 70–99)
Glucose-Capillary: 120 mg/dL — ABNORMAL HIGH (ref 70–99)
Glucose-Capillary: 62 mg/dL — ABNORMAL LOW (ref 70–99)

## 2020-01-16 LAB — D-DIMER, QUANTITATIVE: D-Dimer, Quant: 1.75 ug/mL-FEU — ABNORMAL HIGH (ref 0.00–0.50)

## 2020-01-16 LAB — C-REACTIVE PROTEIN: CRP: 1.9 mg/dL — ABNORMAL HIGH (ref <1.0)

## 2020-01-16 MED ORDER — INSULIN GLARGINE 100 UNIT/ML ~~LOC~~ SOLN
22.0000 [IU] | Freq: Two times a day (BID) | SUBCUTANEOUS | Status: DC
  Administered 2020-01-17 – 2020-01-18 (×3): 22 [IU] via SUBCUTANEOUS
  Filled 2020-01-16 (×4): qty 0.22

## 2020-01-16 MED ORDER — BENZONATATE 100 MG PO CAPS
200.0000 mg | ORAL_CAPSULE | Freq: Three times a day (TID) | ORAL | Status: DC
  Administered 2020-01-16 – 2020-01-18 (×7): 200 mg via ORAL
  Filled 2020-01-16 (×7): qty 2

## 2020-01-16 MED ORDER — INSULIN GLARGINE 100 UNIT/ML ~~LOC~~ SOLN
10.0000 [IU] | Freq: Once | SUBCUTANEOUS | Status: AC
  Administered 2020-01-16: 10 [IU] via SUBCUTANEOUS
  Filled 2020-01-16: qty 0.1

## 2020-01-16 MED ORDER — SODIUM ZIRCONIUM CYCLOSILICATE 10 G PO PACK
10.0000 g | PACK | Freq: Once | ORAL | Status: AC
  Administered 2020-01-16: 10 g via ORAL
  Filled 2020-01-16: qty 1

## 2020-01-16 MED ORDER — PREDNISONE 20 MG PO TABS
50.0000 mg | ORAL_TABLET | Freq: Every day | ORAL | Status: DC
  Administered 2020-01-17 – 2020-01-18 (×2): 50 mg via ORAL
  Filled 2020-01-16 (×2): qty 2

## 2020-01-16 NOTE — Progress Notes (Signed)
K+ down to 5.3 after one dose of Lokelma 10/11. D/w Dr Thereasa Solo and we will repeat another dose today.   Onnie Boer, PharmD, BCIDP, AAHIVP, CPP Infectious Disease Pharmacist 01/16/2020 9:12 AM

## 2020-01-16 NOTE — Progress Notes (Signed)
Micheal Graham  YHC:[id number] DOB: 03/27/1956 DOA: 01/10/2020 PCP: [name], MD    Brief Narrative:  49VO with a history of HTN and CKD stage IIIa who presented to the ED with shortness of breath, cough, diarrhea, and loss of appetite.  He is fully vaccinated against COVID-19.  He reported a recent history of upper respiratory symptoms and malaise starting 9/25 at which time he had a negative rapid Covid antigen test as well as a Covid PCR that was negative (9/26).  He was treated with prednisone and azithromycin and his symptoms had nearly resolved when he began to feel worse again on 01/06/2020.  He was retested for Covid 10/4 and reports that this was positive.  Over the days prior to his presentation he developed worsening loose stools, loss of appetite, dry cough, and dyspnea.  In the ED he was found to be saturating in the 80s on room air and was slightly tachypneic.  Chest x-ray demonstrated infiltrate in the left mid and lower lung fields.  Significant Events:  10/4 outpatient Covid test reportedly positive 10/11 B LE venous duplex negative for DVT   Date of Positive COVID Test:  10/4 -outpatient   Vaccination Status: Fully vaccinated Levan Hurst)  COVID-19 specific Treatment: Remdesivir 10/7 > 10/11 Steroid 10/6 >   Antimicrobials:  Azithro 10/6 > 10/10 Rocephin 10/6 > 10/10  DVT prophylaxis: lovenox   Subjective: Afebrile. VSS - 96% on 11L [place] support - suspect he can be titrated down now. Crt improving.  Making significant progress at this time.  Feeling significantly less short of breath.  Denies chest pain or abdominal pain.  Assessment & Plan:  COVID Pneumonia - acute hypoxic respiratory failure (81% on RA in ED) Continue steroid - has completed a course of remdesivir - not utilizing other immunomodulators due to concern for concomitant significant bacterial pneumonia -oxygen requirement stable over last 24 hours/beginning to improve -inflammatory markers trending  downward - aggressively attempt to wean O2 - hopeful for d/c home in next 48hrs depending on O2 needs and sats w/ exertion   Recent Labs  Lab 01/11/20 0445 01/11/20 0633 01/12/20 0406 01/13/20 0406 01/14/20 0500 01/15/20 0415 01/16/20 0200  DDIMER 0.47   < > 1.13* 1.67* 1.53* 1.83* 1.75*  FERRITIN 1,330*  --  1,670* 1,464* 1,215* 1,078*  --   CRP 24.2*   < > 21.8* 11.4* 5.9* 2.9* 1.9*  ALT  --    < > 33 31 50* 62* 55*  PROCALCITON 7.46  --   --   --  0.71 0.24  --    < > = values in this interval not displayed.    Climbing d-dimer  With AKI will try to avoid IV contrast - venous duplex B LE w/o evidence of DVT - cont prophy dose lovenox w/ d-dimer now decreasing   Bacterial pneumonia Procalcitonin 7.46 at time of admission which is suggestive of a bacterial component - covered with empiric antibiotic for bacterial pneumonia -clinically appears to be stabilizing and procalcitonin is trending downward -stopped antibiotics after 5 days of treatment  Acute kidney injury on CKD stage IIIa Baseline creatinine 1.46 -creatinine has improved to his baseline   Recent Labs  Lab 01/12/20 0406 01/13/20 0406 01/14/20 0500 01/15/20 0415 01/16/20 0200  CREATININE 2.11* 1.58* 1.42* 1.42* 1.36*    Mild transaminitis Likely due to combination of Covid as well as Remdesivir - essentially resolved at this time   Hyponatremia Likely simply dehydration related - corrected with  volume resuscitation  Hyperkalemia Due to acute kidney injury -dosing with Lokelma intermittently and following trend   Hyperglycemia - newly diagnosed severely uncontrolled DM2 no prior history of known DM - A1c 8.3 c/w true DM but I am hesitant to formalize this diagnosis in the setting of acute Covid - CBGs improved significantly - follow without change today - will need f/u as outpt once CoViD fully cleared and off steroid before making formal diagnosis of DM   HTN Blood pressure stable at present  Code  Status: FULL CODE Family Communication:  Status is: Inpatient  Remains inpatient appropriate because:Inpatient level of care appropriate due to severity of illness   Dispo: The patient is from: Home              Anticipated d/c is to: Home              Anticipated d/c date is: 2 days              Patient currently is not medically stable to d/c.   Consultants:  none  Objective: Blood pressure (!) 107/58, pulse 61, temperature 98.4 F (36.9 C), temperature source Oral, resp. rate 19, height 6' 0.01" (1.829 m), weight 105.3 kg, SpO2 96 %.  Intake/Output Summary (Last 24 hours) at 01/16/2020 0958 Last data filed at 01/16/2020 0900 Gross per 24 hour  Intake 490 ml  Output --  Net 490 ml   Filed Weights   01/11/20 1532 01/14/20 1143  Weight: 105.3 kg 105.3 kg    Examination: General: No acute respiratory distress Lungs: Fine diffuse crackles B - improved air movement continues today Cardiovascular: Regular rate and rhythm Abdomen: NT/ND, soft, bs+, no mass  Extremities: No signif edema B LE    CBC: Recent Labs  Lab 01/14/20 0500 01/15/20 0415 01/16/20 0200  WBC 6.9 6.5 6.7  NEUTROABS 6.0 5.4 5.8  HGB 10.6* 11.1* 11.7*  HCT 32.7* 34.3* 35.4*  MCV 97.3 97.2 96.5  PLT 174 176 502   Basic Metabolic Panel: Recent Labs  Lab 01/12/20 0406 01/13/20 0406 01/14/20 0500 01/15/20 0415 01/16/20 0200  NA 132*   < > 139 138 138  K 5.5*   < > 5.3* 5.6* 5.3*  CL 103   < > 108 105 103  CO2 18*   < > 23 24 24   GLUCOSE 396*   < > 194* 209* 155*  BUN 60*   < > 49* 43* 40*  CREATININE 2.11*   < > 1.42* 1.42* 1.36*  CALCIUM 8.2*   < > 8.4* 8.3* 8.6*  MG 2.2  --   --   --   --    < > = values in this interval not displayed.   GFR: Estimated Creatinine Clearance: 67 mL/min (A) (by C-G formula based on SCr of 1.36 mg/dL (H)).  Liver Function Tests: Recent Labs  Lab 01/13/20 0406 01/14/20 0500 01/15/20 0415 01/16/20 0200  AST 27 56* 42* 25  ALT 31 50* 62* 55*   ALKPHOS 57 56 66 67  BILITOT 0.5 0.3 0.4 0.4  PROT 6.5 6.1* 6.3* 6.5  ALBUMIN 2.4* 2.3* 2.4* 2.5*    HbA1C: Hgb A1c MFr Bld  Date/Time Value Ref Range Status  01/11/2020 06:33 AM 8.3 (H) 4.8 - 5.6 % Final    Comment:    (NOTE) Pre diabetes:          5.7%-6.4%  Diabetes:              >6.4%  Glycemic control for   <7.0% adults with diabetes   11/30/2016 10:44 AM 5.9 4.6 - 6.5 % Final    Comment:    Glycemic Control Guidelines for People with Diabetes:Non Diabetic:  <6%Goal of Therapy: <7%Additional Action Suggested:  >8%     CBG: Recent Labs  Lab 01/15/20 0755 01/15/20 1144 01/15/20 1644 01/15/20 2104 01/16/20 0729  GLUCAP 168* 195* 259* 153* 132*    Recent Results (from the past 240 hour(s))  Blood Culture (routine x 2)     Status: None   Collection Time: 01/11/20  4:45 AM   Specimen: BLOOD  Result Value Ref Range Status   Specimen Description BLOOD SITE NOT SPECIFIED  Final   Special Requests   Final    BOTTLES DRAWN AEROBIC AND ANAEROBIC Blood Culture adequate volume   Culture   Final    NO GROWTH 5 DAYS Performed at [facility], [place] [address], [place], [place] [address]    Report Status 01/16/2020 FINAL  Final  Respiratory Panel by RT PCR (Flu A&B, Covid) - Nasopharyngeal Swab     Status: Abnormal   Collection Time: 01/11/20  5:27 AM   Specimen: Nasopharyngeal Swab  Result Value Ref Range Status   SARS Coronavirus 2 by RT PCR POSITIVE (A) NEGATIVE Final    Comment: emailed L. Berdik RN 8:30 01/11/20 ([email or web address]) (NOTE) SARS-CoV-2 target nucleic acids are DETECTED.  SARS-CoV-2 RNA is generally detectable in upper respiratory specimens  during the acute phase of infection. Positive results are indicative of the presence of the identified virus, but do not rule out bacterial infection or co-infection with other pathogens not detected by the test. Clinical correlation with patient history and other diagnostic information is necessary to determine  patient infection status. The expected result is Negative.  Fact Sheet for Patients:  PinkCheek.be  Fact Sheet for Healthcare Providers: GravelBags.it  This test is not yet approved or cleared by the [place] FDA and  has been authorized for detection and/or diagnosis of SARS-CoV-2 by FDA under an Emergency Use Authorization (EUA).  This EUA will remain in effect (meaning this test can be used) for the duration of  the COVID-19 d eclaration under Section 564(b)(1) of the Act, 21 U.S.C. section 360bbb-3(b)(1), unless the authorization is terminated or revoked sooner.      Influenza A by PCR NEGATIVE NEGATIVE Final   Influenza B by PCR NEGATIVE NEGATIVE Final    Comment: (NOTE) The Xpert Xpress SARS-CoV-2/FLU/RSV assay is intended as an aid in  the diagnosis of influenza from Nasopharyngeal swab specimens and  should not be used as a sole basis for treatment. Nasal washings and  aspirates are unacceptable for Xpert Xpress SARS-CoV-2/FLU/RSV  testing.  Fact Sheet for Patients: PinkCheek.be  Fact Sheet for Healthcare Providers: GravelBags.it  This test is not yet approved or cleared by the [place] FDA and  has been authorized for detection and/or diagnosis of SARS-CoV-2 by  FDA under an Emergency Use Authorization (EUA). This EUA will remain  in effect (meaning this test can be used) for the duration of the  Covid-19 declaration under Section 564(b)(1) of the Act, 21  U.S.C. section 360bbb-3(b)(1), unless the authorization is  terminated or revoked. Performed at [facility], [place] [address], [place], [place] [address]   Blood Culture (routine x 2)     Status: None   Collection Time: 01/11/20  6:33 AM   Specimen: BLOOD  Result Value Ref Range Status   Specimen  Description BLOOD SITE NOT SPECIFIED  Final   Special Requests   Final    BOTTLES  DRAWN AEROBIC AND ANAEROBIC Blood Culture adequate volume   Culture   Final    NO GROWTH 5 DAYS Performed at [facility], [address], [place], [place] [address]    Report Status 01/16/2020 FINAL  Final     Scheduled Meds: . benzonatate  200 mg Oral TID  . calcium-vitamin D  1 tablet Oral Daily  . cholecalciferol  2,000 Units Oral Daily  . enoxaparin (LOVENOX) injection  50 mg Subcutaneous Q24H  . insulin aspart  0-20 Units Subcutaneous TID WC  . insulin aspart  0-5 Units Subcutaneous QHS  . insulin aspart  10 Units Subcutaneous TID WC  . insulin glargine  22 Units Subcutaneous BID  . linagliptin  5 mg Oral Daily  . magnesium oxide  400 mg Oral Daily  . methylPREDNISolone (SOLU-MEDROL) injection  60 mg Intravenous Q12H  . sertraline  100 mg Oral Daily  . sodium chloride flush  3 mL Intravenous Q12H      LOS: 5 days   Cherene Altes, MD [facility] Office  [phone] Pager - Text Page per Amion  If 7PM-7AM, please contact night-coverage per Amion 01/16/2020, 9:58 AM

## 2020-01-17 DIAGNOSIS — N179 Acute kidney failure, unspecified: Secondary | ICD-10-CM | POA: Diagnosis not present

## 2020-01-17 DIAGNOSIS — I10 Essential (primary) hypertension: Secondary | ICD-10-CM

## 2020-01-17 DIAGNOSIS — J069 Acute upper respiratory infection, unspecified: Secondary | ICD-10-CM

## 2020-01-17 DIAGNOSIS — J9601 Acute respiratory failure with hypoxia: Secondary | ICD-10-CM | POA: Diagnosis not present

## 2020-01-17 DIAGNOSIS — U071 COVID-19: Principal | ICD-10-CM

## 2020-01-17 DIAGNOSIS — J189 Pneumonia, unspecified organism: Secondary | ICD-10-CM | POA: Diagnosis not present

## 2020-01-17 DIAGNOSIS — N1831 Chronic kidney disease, stage 3a: Secondary | ICD-10-CM

## 2020-01-17 LAB — BASIC METABOLIC PANEL
Anion gap: 10 (ref 5–15)
BUN: 38 mg/dL — ABNORMAL HIGH (ref 8–23)
CO2: 22 mmol/L (ref 22–32)
Calcium: 8.6 mg/dL — ABNORMAL LOW (ref 8.9–10.3)
Chloride: 102 mmol/L (ref 98–111)
Creatinine, Ser: 1.41 mg/dL — ABNORMAL HIGH (ref 0.61–1.24)
GFR, Estimated: 52 mL/min — ABNORMAL LOW (ref >60)
Glucose, Bld: 192 mg/dL — ABNORMAL HIGH (ref 70–99)
Potassium: 5.2 mmol/L — ABNORMAL HIGH (ref 3.5–5.1)
Sodium: 134 mmol/L — ABNORMAL LOW (ref 135–145)

## 2020-01-17 LAB — GLUCOSE, CAPILLARY
Glucose-Capillary: 148 mg/dL — ABNORMAL HIGH (ref 70–99)
Glucose-Capillary: 181 mg/dL — ABNORMAL HIGH (ref 70–99)
Glucose-Capillary: 202 mg/dL — ABNORMAL HIGH (ref 70–99)
Glucose-Capillary: 162 mg/dL — ABNORMAL HIGH (ref 70–99)

## 2020-01-17 MED ORDER — SODIUM ZIRCONIUM CYCLOSILICATE 10 G PO PACK
10.0000 g | PACK | Freq: Two times a day (BID) | ORAL | Status: AC
  Administered 2020-01-17 (×2): 10 g via ORAL
  Filled 2020-01-17 (×2): qty 1

## 2020-01-17 NOTE — TOC Initial Note (Signed)
Transition of Care [facility]) - Initial/Assessment Note    Patient Details  Name: Micheal Graham MRN: [id number] Date of Birth: 12-13-1955  [facility]) CM/SW Contact:    Hyman Hopes, RN Phone Number: 01/17/2020, 1:36 PM  Clinical Narrative:                 Case manager spoke on phone to patient regarding TOC for home.  Prior to admission patient was independent of all ADLs and lived with wife.  Address verified, insurance verified.  Patient denies any use of DMEs prior to admission.  Denies any issues with obtaining or affording medications.  Verified PCP as Carolann Littler and discussed need for setting appointment within 7 days of discharge.  Gave patient Medicare.gov choice for home oxygen.  Patient does not have a preference on companies, called [employer], [place], and ordered oxygen.    Expected Discharge Plan: Home/Self Care Barriers to Discharge: No Barriers Identified   Patient Goals and CMS Choice Patient states their goals for this hospitalization and ongoing recovery are:: to go home CMS Medicare.gov Compare Post Acute Care list provided to:: Patient Choice offered to / list presented to : Patient  Expected Discharge Plan and Services Expected Discharge Plan: Home/Self Care   Discharge Planning Services: CM Consult Post Acute Care Choice: Durable Medical Equipment Living arrangements for the past 2 months: Single Family Home                 DME Arranged: Oxygen DME Agency: [employer] Date DME Agency Contacted: 01/17/20 Time DME Agency Contacted: [id number] Representative spoke with at DME Agency: Thedore Mins            Prior Living Arrangements/Services Living arrangements for the past 2 months: [place] Lives with:: Spouse Patient language and need for interpreter reviewed:: Yes Do you feel safe going back to the place where you live?: Yes      Need for Family Participation in Patient Care: Yes (Comment) Care giver support system in place?: Yes (comment)    Criminal Activity/Legal Involvement Pertinent to Current Situation/Hospitalization: No - Comment as needed  Activities of Daily Living Home Assistive Devices/Equipment: None ADL Screening (condition at time of admission) Patient's cognitive ability adequate to safely complete daily activities?: Yes Is the patient deaf or have difficulty hearing?: No Does the patient have difficulty seeing, even when wearing glasses/contacts?: No Does the patient have difficulty concentrating, remembering, or making decisions?: No Patient able to express need for assistance with ADLs?: Yes Does the patient have difficulty dressing or bathing?: No Independently performs ADLs?: Yes (appropriate for developmental age) Communication: Independent Dressing (OT): Independent Grooming: Independent Feeding: Independent Bathing: Independent Toileting: Independent In/Out Bed: Independent Walks in Home: Independent Does the patient have difficulty walking or climbing stairs?: No Weakness of Legs: None Weakness of Arms/Hands: None  Permission Sought/Granted Permission sought to share information with : Case Manager Permission granted to share information with : Yes, Verbal Permission Granted     Permission granted to share info w AGENCY: [employer]        Emotional Assessment   Attitude/Demeanor/Rapport: Engaged Affect (typically observed): Appropriate Orientation: : Oriented to Self, Oriented to Place, Oriented to  Time, Oriented to Situation Alcohol / Substance Use: Not Applicable Psych Involvement: No (comment)  Admission diagnosis:  Pneumonia [J18.9] Acute respiratory failure with hypoxia ([place]) [J96.01] COVID-19 [U07.1] Patient Active Problem List   Diagnosis Date Noted  . Pneumonia 01/11/2020  . Acute respiratory failure with hypoxia ([place]) 01/11/2020  .  Acute renal failure superimposed on stage 3a chronic kidney disease ([place]) 01/11/2020  . Chest pain 01/11/2020  . Osteopenia 01/13/2019  . Celiac  disease 11/07/2018  . Low testosterone 11/30/2016  . Vitamin D deficiency 11/30/2016  . Carpal tunnel syndrome of right wrist 02/14/2014  . Hereditary and idiopathic peripheral neuropathy 02/14/2014  . Cognitive impairment, mild, so stated 01/23/2014  . Ataxia 01/23/2014  . 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 Hypertension 09/09/2011  . Kidney stones 09/09/2011  . Anxiety 09/09/2011  . Colon polyps 09/09/2011  . Basal cell cancer 09/09/2011   PCP:  [name], MD Pharmacy:   [id number] [place] [address]Phone: [phone]: [phone]    Social Determinants of Health (SDOH) Interventions    Readmission Risk Interventions No flowsheet data found.

## 2020-01-17 NOTE — Progress Notes (Addendum)
PROGRESS NOTE    Micheal Graham  OJJ:[id number] DOB: 23-Sep-1955 DOA: 01/10/2020 PCP: [name], MD    Brief Narrative:  Micheal Graham is a 64 year old male with past medical history notable for essential hypertension, CKD stage IIIa who presented to the ED with progressive shortness of breath, cough, diarrhea and loss of appetite.  He is fully vaccinated against Covid-19.  Recent history of upper respiratory symptoms with associated malaise.  Reports negative rapid Covid antigen test/Covid PCR - 12/31/2019.  He was treated with prednisone and azithromycin.  His symptoms nearly resolved but was retested for Covid-19 on 01/08/2020 and reports that this was positive.  In the ED he was found to be hypoxic with SPO2 in the 80s on room air and was slightly Tachypneic.  Chest x-ray demonstrated infiltrate in the left mid/lower lung fields.  TRH was consulted for admission and further treatment of acute hypoxic respiratory failure secondary to Covid-19 viral pneumonia.   Assessment & Plan:   Principal Problem:   Pneumonia Active Problems:   Hypertension   Acute respiratory failure with hypoxia (HCC)   Acute renal failure superimposed on stage 3a chronic kidney disease (HCC)   Acute hypoxic respiratory failure secondary to acute Covid-19 viral pneumonia during the ongoing 2020 Covid 19 Pandemic - POA Patient presenting to the ED with progressive shortness of breath, malaise, nausea/diarrhea and loss of appetite.  Chest x-ray with findings consistent with viral multifocal pneumonia. --COVID test: + 01/08/2020 --CRP 24.2>21.8>11.4>5.9>2.9>1.9 --ddimer 0.47>1.13>1.67>1.53>1.83>1.75 --Completed 5-day course of remdesivir on 01/15/2020 --Continue prednisone 61m PO daily --prone for 2-3hrs every 12hrs if able --Continue supplemental oxygen, titrate to maintain SPO2 greater than 92%, on 3L [place] --Continue supportive care with albuterol MDI prn, vitamin C, zinc, Tylenol, antitussives (benzonatate/  Mucinex/Tussionex) --Follow CBC, CMP, D-dimer, ferritin, and CRP daily --Continue airborne/contact isolation precautions for 3 weeks from the day of diagnosis  The treatment plan and use of medications and known side effects were discussed with patient/family. Some of the medications used are based on case reports/anecdotal data.  All other medications being used in the management of COVID-19 based on limited study data.  Complete risks and long-term side effects are unknown, however in the best clinical judgment they seem to be of some benefit.  Patient wanted to proceed with treatment options provided.  Elevated D-dimer --Venous duplex bilateral lower extremity ultrasound negative for DVT.  Acute renal failure on CKD stage IIIa Etiology likely secondary to dehydration with poor oral intake in the preceding days hospitalization.  Baseline creatinine 1.46 11/07/2018. --Cr 3.09>2.11>1.58>1.42>1.36>1.41 --Avoid nephrotoxins, renally dose all medications --Follow BMP daily  Bacterial pneumonia Procalcitonin elevated at 7.46 at time admission, suggestive of a bacterial component.  Completed 5-day course of azithromycin/ceftriaxone.  Transaminitis Etiology likely secondary to Covid-19 viral infection.  Now resolved.  Hyponatremia: Resolved Etiology likely secondary to dehydration, improved with volume resuscitation.  Hyperkalemia Etiology likely secondary to acute renal failure.  Potassium 5.2 today, improved from 5.3 yesterday. --Repeat Lokelma twice daily today --Repeat BMP in a.m.  Essential hypertension On metoprolol succinate 50 mg p.o. daily, amlodipine-benazepril 5-20 mg p.o. daily at home. --BP 119/79 today --Continue to hold home antihypertensives --Continue to monitor blood pressure closely  Depression/anxiety: Continue sertraline milligrams p.o. daily  T2DM Hemoglobin A1c 8.3, no previous diagnosis of diabetes. --Lantus 22u BID --Novolog 10u TIDAC --ISS for further  coverage --CBGs before every meal/at bedtime --Will need close outpatient follow-up with PCP   DVT prophylaxis: Lovenox Code Status: Full code Family  Communication: Updated patient extensively at bedside  Disposition Plan:  Status is: Inpatient  Remains inpatient appropriate because:Unsafe d/c plan, IV treatments appropriate due to intensity of illness or inability to take PO and Inpatient level of care appropriate due to severity of illness   Dispo: The patient is from: Home              Anticipated d/c is to: Home              Anticipated d/c date is: 1 day              Patient currently is not medically stable to d/c.   Consultants:   None  Procedures:   None  Antimicrobials:   Azithromycin 10/7 -10/12  Ceftriaxone 10/7 10/11   Subjective: Patient seen and examined bedside, resting comfortably in bedside chair.  No complaints this morning.  Continues to feel better each day in terms of his breathing status but not back to his normal baseline.  Continues on 3 L per nasal cannula.  No other complaints or concerns at this time.  Denies headache, no fever/chills/night sweats, no visual changes, no nausea/vomiting/diarrhea, no chest pain, palpitations, no abdominal pain, no weakness, no fatigue, no paresthesias.  No acute events overnight per nursing staff.  Objective: Vitals:   01/16/20 2118 01/17/20 0608 01/17/20 0705 01/17/20 0716  BP: (!) 111/54 119/79  121/77  Pulse: 76 63  69  Resp: 17 15 16 17   Temp: 98.6 F (37 C) 98.2 F (36.8 C)  97.8 F (36.6 C)  TempSrc: Oral Oral  Oral  SpO2: 96% 96% 96% 95%  Weight:      Height:        Intake/Output Summary (Last 24 hours) at 01/17/2020 1421 Last data filed at 01/17/2020 0851 Gross per 24 hour  Intake 240 ml  Output --  Net 240 ml   Filed Weights   01/11/20 1532 01/14/20 1143  Weight: 105.3 kg 105.3 kg    Examination:  General exam: Appears calm and comfortable  Respiratory system: Clear to  auscultation. Respiratory effort normal.  On 3 L nasal cannula with SPO2 96% Cardiovascular system: S1 & S2 heard, RRR. No JVD, murmurs, rubs, gallops or clicks. No pedal edema. Gastrointestinal system: Abdomen is nondistended, soft and nontender. No organomegaly or masses felt. Normal bowel sounds heard. Central nervous system: Alert and oriented. No focal neurological deficits. Extremities: Symmetric 5 x 5 power. Skin: No rashes, lesions or ulcers Psychiatry: Judgement and insight appear normal. Mood & affect appropriate.     Data Reviewed: I have personally reviewed following labs and imaging studies  CBC: Recent Labs  Lab 01/12/20 0406 01/13/20 0406 01/14/20 0500 01/15/20 0415 01/16/20 0200  WBC 8.4 10.2 6.9 6.5 6.7  NEUTROABS 7.4 9.3* 6.0 5.4 5.8  HGB 10.8* 10.9* 10.6* 11.1* 11.7*  HCT 32.8* 32.9* 32.7* 34.3* 35.4*  MCV 96.2 95.6 97.3 97.2 96.5  PLT 120* 160 174 176 194   Basic Metabolic Panel: Recent Labs  Lab 01/12/20 0406 01/12/20 0406 01/13/20 0406 01/14/20 0500 01/15/20 0415 01/16/20 0200 01/17/20 0323  NA 132*   < > 136 139 138 138 134*  K 5.5*   < > 5.0 5.3* 5.6* 5.3* 5.2*  CL 103   < > 107 108 105 103 102  CO2 18*   < > 18* 23 24 24 22   GLUCOSE 396*   < > 333* 194* 209* 155* 192*  BUN 60*   < > 56* 49* 43*  40* 38*  CREATININE 2.11*   < > 1.58* 1.42* 1.42* 1.36* 1.41*  CALCIUM 8.2*   < > 8.1* 8.4* 8.3* 8.6* 8.6*  MG 2.2  --   --   --   --   --   --    < > = values in this interval not displayed.   GFR: Estimated Creatinine Clearance: 64.7 mL/min (A) (by C-G formula based on SCr of 1.41 mg/dL (H)). Liver Function Tests: Recent Labs  Lab 01/12/20 0406 01/13/20 0406 01/14/20 0500 01/15/20 0415 01/16/20 0200  AST 35 27 56* 42* 25  ALT 33 31 50* 62* 55*  ALKPHOS 58 57 56 66 67  BILITOT 0.6 0.5 0.3 0.4 0.4  PROT 6.4* 6.5 6.1* 6.3* 6.5  ALBUMIN 2.3* 2.4* 2.3* 2.4* 2.5*   No results for input(s): LIPASE, AMYLASE in the last 168 hours. No results  for input(s): AMMONIA in the last 168 hours. Coagulation Profile: No results for input(s): INR, PROTIME in the last 168 hours. Cardiac Enzymes: No results for input(s): CKTOTAL, CKMB, CKMBINDEX, TROPONINI in the last 168 hours. BNP (last 3 results) No results for input(s): PROBNP in the last 8760 hours. HbA1C: No results for input(s): HGBA1C in the last 72 hours. CBG: Recent Labs  Lab 01/16/20 1601 01/16/20 1954 01/16/20 2029 01/17/20 0719 01/17/20 1151  GLUCAP 127* 62* 120* 148* 181*   Lipid Profile: No results for input(s): CHOL, HDL, LDLCALC, TRIG, CHOLHDL, LDLDIRECT in the last 72 hours. Thyroid Function Tests: No results for input(s): TSH, T4TOTAL, FREET4, T3FREE, THYROIDAB in the last 72 hours. Anemia Panel: Recent Labs    01/15/20 0415  FERRITIN 1,078*   Sepsis Labs: Recent Labs  Lab 01/11/20 0445 01/11/20 0527 01/14/20 0500 01/15/20 0415  PROCALCITON 7.46  --  0.71 0.24  LATICACIDVEN  --  1.7  --   --     Recent Results (from the past 240 hour(s))  Blood Culture (routine x 2)     Status: None   Collection Time: 01/11/20  4:45 AM   Specimen: BLOOD  Result Value Ref Range Status   Specimen Description BLOOD SITE NOT SPECIFIED  Final   Special Requests   Final    BOTTLES DRAWN AEROBIC AND ANAEROBIC Blood Culture adequate volume   Culture   Final    NO GROWTH 5 DAYS Performed at [facility], [place] [address] [place], [place] [address]    Report Status 01/16/2020 FINAL  Final  Respiratory Panel by RT PCR (Flu A&B, Covid) - Nasopharyngeal Swab     Status: Abnormal   Collection Time: 01/11/20  5:27 AM   Specimen: Nasopharyngeal Swab  Result Value Ref Range Status   SARS Coronavirus 2 by RT PCR POSITIVE (A) NEGATIVE Final    Comment: emailed L. Berdik RN 8:30 01/11/20 ([email or web address]) (NOTE) SARS-CoV-2 target nucleic acids are DETECTED.  SARS-CoV-2 RNA is generally detectable in upper respiratory specimens  during the acute phase of infection. Positive  results are indicative of the presence of the identified virus, but do not rule out bacterial infection or co-infection with other pathogens not detected by the test. Clinical correlation with patient history and other diagnostic information is necessary to determine patient infection status. The expected result is Negative.  Fact Sheet for Patients:  PinkCheek.be  Fact Sheet for Healthcare Providers: GravelBags.it  This test is not yet approved or cleared by the [place] FDA and  has been authorized for detection and/or diagnosis of SARS-CoV-2 by FDA under an  Emergency Use Authorization (EUA).  This EUA will remain in effect (meaning this test can be used) for the duration of  the COVID-19 d eclaration under Section 564(b)(1) of the Act, 21 U.S.C. section 360bbb-3(b)(1), unless the authorization is terminated or revoked sooner.      Influenza A by PCR NEGATIVE NEGATIVE Final   Influenza B by PCR NEGATIVE NEGATIVE Final    Comment: (NOTE) The Xpert Xpress SARS-CoV-2/FLU/RSV assay is intended as an aid in  the diagnosis of influenza from Nasopharyngeal swab specimens and  should not be used as a sole basis for treatment. Nasal washings and  aspirates are unacceptable for Xpert Xpress SARS-CoV-2/FLU/RSV  testing.  Fact Sheet for Patients: PinkCheek.be  Fact Sheet for Healthcare Providers: GravelBags.it  This test is not yet approved or cleared by the [place] FDA and  has been authorized for detection and/or diagnosis of SARS-CoV-2 by  FDA under an Emergency Use Authorization (EUA). This EUA will remain  in effect (meaning this test can be used) for the duration of the  Covid-19 declaration under Section 564(b)(1) of the Act, 21  U.S.C. section 360bbb-3(b)(1), unless the authorization is  terminated or revoked. Performed at [facility],  [place] [address] [place], [place] [address]   Blood Culture (routine x 2)     Status: None   Collection Time: 01/11/20  6:33 AM   Specimen: BLOOD  Result Value Ref Range Status   Specimen Description BLOOD SITE NOT SPECIFIED  Final   Special Requests   Final    BOTTLES DRAWN AEROBIC AND ANAEROBIC Blood Culture adequate volume   Culture   Final    NO GROWTH 5 DAYS Performed at [facility], [address] [place], [place] [address]    Report Status 01/16/2020 FINAL  Final         Radiology Studies: VAS [place] LOWER EXTREMITY VENOUS (DVT)  Result Date: 01/15/2020  Lower Venous DVTStudy Indications: Elevated d-dimer with Covid.  Comparison Study: No prior. Performing Technologist: Oda Cogan RDMS, RVT  Examination Guidelines: A complete evaluation includes B-mode imaging, spectral Doppler, color Doppler, and power Doppler as needed of all accessible portions of each vessel. Bilateral testing is considered an integral part of a complete examination. Limited examinations for reoccurring indications may be performed as noted. The reflux portion of the exam is performed with the patient in reverse Trendelenburg.  +---------+---------------+---------+-----------+----------+--------------+ RIGHT    CompressibilityPhasicitySpontaneityPropertiesThrombus Aging +---------+---------------+---------+-----------+----------+--------------+ CFV      Full           Yes      Yes                                 +---------+---------------+---------+-----------+----------+--------------+ SFJ      Full                                                        +---------+---------------+---------+-----------+----------+--------------+ FV Prox  Full                                                        +---------+---------------+---------+-----------+----------+--------------+  FV Mid   Full                                                         +---------+---------------+---------+-----------+----------+--------------+ FV DistalFull                                                        +---------+---------------+---------+-----------+----------+--------------+ PFV      Full                                                        +---------+---------------+---------+-----------+----------+--------------+ POP      Full           Yes      Yes                                 +---------+---------------+---------+-----------+----------+--------------+ PTV      Full                                                        +---------+---------------+---------+-----------+----------+--------------+ PERO     Full                                                        +---------+---------------+---------+-----------+----------+--------------+   +---------+---------------+---------+-----------+----------+--------------+ LEFT     CompressibilityPhasicitySpontaneityPropertiesThrombus Aging +---------+---------------+---------+-----------+----------+--------------+ CFV      Full           Yes      Yes                                 +---------+---------------+---------+-----------+----------+--------------+ SFJ      Full                                                        +---------+---------------+---------+-----------+----------+--------------+ FV Prox  Full                                                        +---------+---------------+---------+-----------+----------+--------------+ FV Mid   Full                                                        +---------+---------------+---------+-----------+----------+--------------+  FV DistalFull                                                        +---------+---------------+---------+-----------+----------+--------------+ PFV      Full                                                         +---------+---------------+---------+-----------+----------+--------------+ POP      Full           Yes      Yes                                 +---------+---------------+---------+-----------+----------+--------------+ PTV      Full                                                        +---------+---------------+---------+-----------+----------+--------------+ PERO     Full                                                        +---------+---------------+---------+-----------+----------+--------------+     Summary: BILATERAL: - No evidence of deep vein thrombosis seen in the lower extremities, bilaterally. -No evidence of popliteal cyst, bilaterally.   *See table(s) above for measurements and observations. Electronically signed by Monica Martinez MD on 01/15/2020 at 5:31:08 PM.    Final         Scheduled Meds: . benzonatate  200 mg Oral TID  . calcium-vitamin D  1 tablet Oral Daily  . cholecalciferol  2,000 Units Oral Daily  . enoxaparin (LOVENOX) injection  50 mg Subcutaneous Q24H  . insulin aspart  0-20 Units Subcutaneous TID WC  . insulin aspart  0-5 Units Subcutaneous QHS  . insulin aspart  10 Units Subcutaneous TID WC  . insulin glargine  22 Units Subcutaneous BID  . linagliptin  5 mg Oral Daily  . magnesium oxide  400 mg Oral Daily  . predniSONE  50 mg Oral Q breakfast  . sertraline  100 mg Oral Daily  . sodium chloride flush  3 mL Intravenous Q12H  . sodium zirconium cyclosilicate  10 g Oral BID   Continuous Infusions:   LOS: 6 days    Time spent: 36 minutes spent on chart review, discussion with nursing staff, consultants, updating family and interview/physical exam; more than 50% of that time was spent in counseling and/or coordination of care.    Talon Regala J [place] DO [facility] Available via Epic secure chat 7am-7pm After these hours, please refer to coverage provider listed on [email or web address] 01/17/2020, 2:21 PM

## 2020-01-17 NOTE — Progress Notes (Signed)
SATURATION QUALIFICATIONS: (This note is used to comply with regulatory documentation for home oxygen)  Patient Saturations on Room Air at Rest = 92%  Patient Saturations on Room Air while Ambulating = 88%  Patient Saturations on 2 Liters of oxygen while Ambulating = 94%  Please briefly explain why patient needs home oxygen:

## 2020-01-17 NOTE — Progress Notes (Signed)
K+ 5.2 today despite two doses of Lokelma for the past 2 days. D/w Dr. [place] and we will give two doses today of Lokelma 10g.  Onnie Boer, PharmD, BCIDP, AAHIVP, CPP Infectious Disease Pharmacist 01/17/2020 10:16 AM

## 2020-01-18 DIAGNOSIS — U071 COVID-19: Secondary | ICD-10-CM | POA: Diagnosis present

## 2020-01-18 DIAGNOSIS — J069 Acute upper respiratory infection, unspecified: Secondary | ICD-10-CM | POA: Diagnosis not present

## 2020-01-18 LAB — GLUCOSE, CAPILLARY
Glucose-Capillary: 74 mg/dL (ref 70–99)
Glucose-Capillary: 121 mg/dL — ABNORMAL HIGH (ref 70–99)

## 2020-01-18 MED ORDER — "PEN NEEDLES 3/16"" 31G X 5 MM MISC": 0 refills | Status: DC

## 2020-01-18 MED ORDER — INSULIN ASPART (W/NIACINAMIDE) 100 UNIT/ML ~~LOC~~ SOPN: 2.0000 [IU] | PEN_INJECTOR | Freq: Three times a day (TID) | SUBCUTANEOUS | 0 refills | Status: AC

## 2020-01-18 MED ORDER — PREDNISONE 10 MG PO TABS: ORAL_TABLET | ORAL | 0 refills | Status: AC

## 2020-01-18 MED ORDER — BLOOD GLUCOSE MONITOR KIT: PACK | 0 refills | Status: DC

## 2020-01-18 MED ORDER — ACETAMINOPHEN 325 MG PO TABS: 650.0000 mg | ORAL_TABLET | Freq: Four times a day (QID) | ORAL | Status: AC | PRN

## 2020-01-18 MED ORDER — INSULIN ASPART (W/NIACINAMIDE) 100 UNIT/ML ~~LOC~~ SOPN: 2.0000 [IU] | PEN_INJECTOR | Freq: Three times a day (TID) | SUBCUTANEOUS | 0 refills | Status: DC

## 2020-01-18 NOTE — Progress Notes (Signed)
Pt given discharge instructions, prescriptions, and care notes. Pt verbalized understanding AEB no further questions or concerns at this time. Pt's wife was also called and all DC instructions explained in full detail, no further questions. IV was discontinued, no redness, pain, or swelling noted at this time. Pt left the floor via wheelchair with staff in stable condition.

## 2020-01-18 NOTE — Discharge Instructions (Signed)
Diabetes Basics  Diabetes (diabetes mellitus) is a long-term (chronic) disease. It occurs when the body does not properly use sugar (glucose) that is released from food after you eat. Diabetes may be caused by one or both of these problems:  Your pancreas does not make enough of a hormone called insulin.  Your body does not react in a normal way to insulin that it makes. Insulin lets sugars (glucose) go into cells in your body. This gives you energy. If you have diabetes, sugars cannot get into cells. This causes high blood sugar (hyperglycemia). Follow these instructions at home: How is diabetes treated? You may need to take insulin or other diabetes medicines daily to keep your blood sugar in balance. Take your diabetes medicines every day as told by your doctor. List your diabetes medicines here: Diabetes medicines  Name of medicine: ______________________________ ? Amount (dose): _______________ Time (a.m./p.m.): _______________ Notes: ___________________________________  Name of medicine: ______________________________ ? Amount (dose): _______________ Time (a.m./p.m.): _______________ Notes: ___________________________________  Name of medicine: ______________________________ ? Amount (dose): _______________ Time (a.m./p.m.): _______________ Notes: ___________________________________ If you use insulin, you will learn how to give yourself insulin by injection. You may need to adjust the amount based on the food that you eat. List the types of insulin you use here: Insulin  Insulin type: ______________________________ ? Amount (dose): _______________ Time (a.m./p.m.): _______________ Notes: ___________________________________  Insulin type: ______________________________ ? Amount (dose): _______________ Time (a.m./p.m.): _______________ Notes: ___________________________________  Insulin type: ______________________________ ? Amount (dose): _______________ Time (a.m./p.m.):  _______________ Notes: ___________________________________  Insulin type: ______________________________ ? Amount (dose): _______________ Time (a.m./p.m.): _______________ Notes: ___________________________________  Insulin type: ______________________________ ? Amount (dose): _______________ Time (a.m./p.m.): _______________ Notes: ___________________________________ How do I manage my blood sugar?  Check your blood sugar levels using a blood glucose monitor as directed by your doctor. Your doctor will set treatment goals for you. Generally, you should have these blood sugar levels:  Before meals (preprandial): 80-130 mg/dL (4.4-7.2 mmol/L).  After meals (postprandial): below 180 mg/dL (10 mmol/L).  A1c level: less than 7%. Write down the times that you will check your blood sugar levels: Blood sugar checks  Time: _______________ Notes: ___________________________________  Time: _______________ Notes: ___________________________________  Time: _______________ Notes: ___________________________________  Time: _______________ Notes: ___________________________________  Time: _______________ Notes: ___________________________________  Time: _______________ Notes: ___________________________________  What do I need to know about low blood sugar? Low blood sugar is called hypoglycemia. This is when blood sugar is at or below 70 mg/dL (3.9 mmol/L). Symptoms may include:  Feeling: ? Hungry. ? Worried or nervous (anxious). ? Sweaty and clammy. ? Confused. ? Dizzy. ? Sleepy. ? Sick to your stomach (nauseous).  Having: ? A fast heartbeat. ? A headache. ? A change in your vision. ? Tingling or no feeling (numbness) around the mouth, lips, or tongue. ? Jerky movements that you cannot control (seizure).  Having trouble with: ? Moving (coordination). ? Sleeping. ? Passing out (fainting). ? Getting upset easily (irritability). Treating low blood sugar To treat low blood  sugar, eat or drink something sugary right away. If you can think clearly and swallow safely, follow the 15:15 rule:  Take 15 grams of a fast-acting carb (carbohydrate). Talk with your doctor about how much you should take.  Some fast-acting carbs are: ? Sugar tablets (glucose pills). Take 3-4 glucose pills. ? 6-8 pieces of hard candy. ? 4-6 oz (120-150 mL) of fruit juice. ? 4-6 oz (120-150 mL) of regular (not diet) soda. ? 1 Tbsp (15 mL) honey or sugar.  Check your blood sugar 15 minutes after you take the carb.  If your blood sugar is still at or below 70 mg/dL (3.9 mmol/L), take 15 grams of a carb again.  If your blood sugar does not go above 70 mg/dL (3.9 mmol/L) after 3 tries, get help right away.  After your blood sugar goes back to normal, eat a meal or a snack within 1 hour. Treating very low blood sugar If your blood sugar is at or below 54 mg/dL (3 mmol/L), you have very low blood sugar (severe hypoglycemia). This is an emergency. Do not wait to see if the symptoms will go away. Get medical help right away. Call your local emergency services (911 in the [place] Do not drive yourself to the hospital. Questions to ask your health care provider  Do I need to meet with a diabetes educator?  What equipment will I need to care for myself at home?  What diabetes medicines do I need? When should I take them?  How often do I need to check my blood sugar?  What number can I call if I have questions?  When is my next doctor's visit?  Where can I find a support group for people with diabetes? Where to find more information  American Diabetes Association: www.diabetes.org  American Association of Diabetes Educators: www.diabeteseducator.org/patient-resources Contact a doctor if:  Your blood sugar is at or above 240 mg/dL (13.3 mmol/L) for 2 days in a row.  You have been sick or have had a fever for 2 days or more, and you are not getting better.  You have any of these  problems for more than 6 hours: ? You cannot eat or drink. ? You feel sick to your stomach (nauseous). ? You throw up (vomit). ? You have watery poop (diarrhea). Get help right away if:  Your blood sugar is lower than 54 mg/dL (3 mmol/L).  You get confused.  You have trouble: ? Thinking clearly. ? Breathing. Summary  Diabetes (diabetes mellitus) is a long-term (chronic) disease. It occurs when the body does not properly use sugar (glucose) that is released from food after digestion.  Take insulin and diabetes medicines as told.  Check your blood sugar every day, as often as told.  Keep all follow-up visits as told by your doctor. This is important. This information is not intended to replace advice given to you by your health care provider. Make sure you discuss any questions you have with your health care provider. Document Revised: 12/14/2018 Document Reviewed: 06/25/2017 Elsevier Patient Education  [place]. Daily Diabetes Record Check your blood glucose (BG) as directed by your health care provider. Use this form to record your BG results as well as any diabetes medicines that you take, including insulin. Bringing a record of your BG results and a list of your current medicines to your health care provider is very helpful in managing your diabetes. These numbers help your health care provider to know whether your diabetes management plan needs to be changed. Patient name: ____________________________________ Week of ____________________ Daily BG results and diabetes medicines Date: _________  Breakfast - BG / Medicines: ________________ / __________________________________________________________  Lunch - BG / Medicines: ___________________ / __________________________________________________________  Dinner - BG / Medicines: __________________ / __________________________________________________________  Bedtime - BG / Medicines: ________________ /  ___________________________________________________________ Date: _________  Breakfast - BG / Medicines: ________________ / __________________________________________________________  Lunch - BG / Medicines: ___________________ / __________________________________________________________  Dinner - BG / Medicines: __________________ / __________________________________________________________  Bedtime - BG / Medicines: ________________ / ___________________________________________________________ Date: _________  Breakfast - BG / Medicines: ________________ / __________________________________________________________  Lunch - BG / Medicines: ___________________ / __________________________________________________________  Dinner - BG / Medicines: __________________ / __________________________________________________________  Bedtime - BG / Medicines: ________________ / ___________________________________________________________ Date: _________  Breakfast - BG / Medicines: ________________ / __________________________________________________________  Lunch - BG / Medicines: ___________________ / __________________________________________________________  Dinner - BG / Medicines: __________________ / __________________________________________________________  Bedtime - BG / Medicines: ________________ / ___________________________________________________________ Date: _________  Breakfast - BG / Medicines: ________________ / __________________________________________________________  Lunch - BG / Medicines: ___________________ / __________________________________________________________  Dinner - BG / Medicines: __________________ / __________________________________________________________  Bedtime - BG / Medicines: ________________ / ___________________________________________________________ Date: _________  Breakfast - BG / Medicines: ________________ /  __________________________________________________________  Lunch - BG / Medicines: ___________________ / __________________________________________________________  Dinner - BG / Medicines: __________________ / __________________________________________________________  Bedtime - BG / Medicines: ________________ / ___________________________________________________________ Date: _________  Breakfast - BG / Medicines: ________________ / __________________________________________________________  Lunch - BG / Medicines: ___________________ / __________________________________________________________  Dinner - BG / Medicines: __________________ / __________________________________________________________  Bedtime - BG / Medicines: ________________ / ___________________________________________________________ Notes: ______________________________________________________________________________________________________________________ This information is not intended to replace advice given to you by your health care provider. Make sure you discuss any questions you have with your health care provider. Document Revised: 01/04/2018 Document Reviewed: 12/20/2015 Elsevier Patient Education  [place]. Blood Glucose Monitoring, Adult Monitoring your blood sugar (glucose) is an important part of managing your diabetes (diabetes mellitus). Blood glucose monitoring involves checking your blood glucose as often as directed and keeping a record (log) of your results over time. Checking your blood glucose regularly and keeping a blood glucose log can:  Help you and your health care provider adjust your diabetes management plan as needed, including your medicines or insulin.  Help you understand how food, exercise, illnesses, and medicines affect your blood glucose.  Let you know what your blood glucose is at any time. You can quickly find out if you have low blood glucose (hypoglycemia) or high  blood glucose (hyperglycemia). Your health care provider will set individualized treatment goals for you. Your goals will be based on your age, other medical conditions you have, and how you respond to diabetes treatment. Generally, the goal of treatment is to maintain the following blood glucose levels:  Before meals (preprandial): 80-130 mg/dL (4.4-7.2 mmol/L).  After meals (postprandial): below 180 mg/dL (10 mmol/L).  A1c level: less than 7%. Supplies needed:  Blood glucose meter.  Test strips for your meter. Each meter has its own strips. You must use the strips that came with your meter.  A needle to prick your finger (lancet). Do not use a lancet more than one time.  A device that holds the lancet (lancing device).  A journal or log book to write down your results. How to check your blood glucose  1. Wash your hands with soap and water. 2. Prick the side of your finger (not the tip) with the lancet. Use a different finger each time. 3. Gently rub the finger until a small drop of blood appears. 4. Follow instructions that come with your meter for inserting the test strip, applying blood to the strip, and using your blood glucose meter. 5. Write down your result and any notes. Some meters allow you to use areas of your body other than your finger (alternative sites) to test your blood. The most common alternative sites are:  Forearm.  Thigh.  Palm of the hand. If you think you may have hypoglycemia, or if you have a history of not knowing when your blood glucose is getting low (hypoglycemia unawareness), do not use alternative sites. Use your finger instead. Alternative sites may not be as accurate as the fingers, because blood flow is slower in these areas. This means that the result you get may be delayed, and it may be different from the result that you would get from your finger. Follow these instructions at home: Blood glucose log   Every time you check your blood  glucose, write down your result. Also write down any notes about things that may be affecting your blood glucose, such as your diet and exercise for the day. This information can help you and your health care provider: ? Look for patterns in your blood glucose over time. ? Adjust your diabetes management plan as needed.  Check if your meter allows you to download your records to a computer. Most glucose meters store a record of glucose readings in the meter. If you have type 1 diabetes:  Check your blood glucose 2 or more times a day.  Also check your blood glucose: ? Before every insulin injection. ? Before and after exercise. ? Before meals. ? 2 hours after a meal. ? Occasionally between 2:00 a.m. and 3:00 a.m., as directed. ? Before potentially dangerous tasks, like driving or using heavy machinery. ? At bedtime.  You may need to check your blood glucose more often, up to 6-10 times a day, if you: ? Use an insulin pump. ? Need multiple daily injections (MDI). ? Have diabetes that is not well-controlled. ? Are ill. ? Have a history of severe hypoglycemia. ? Have hypoglycemia unawareness. If you have type 2 diabetes:  If you take insulin or other diabetes medicines, check your blood glucose 2 or more times a day.  If you are on intensive insulin therapy, check your blood glucose 4 or more times a day. Occasionally, you may also need to check between 2:00 a.m. and 3:00 a.m., as directed.  Also check your blood glucose: ? Before and after exercise. ? Before potentially dangerous tasks, like driving or using heavy machinery.  You may need to check your blood glucose more often if: ? Your medicine is being adjusted. ? Your diabetes is not well-controlled. ? You are ill. General tips  Always keep your supplies with you.  If you have questions or need help, all blood glucose meters have a 24-hour "hotline" phone number that you can call. You may also contact your health care  provider.  After you use a few boxes of test strips, adjust (calibrate) your blood glucose meter by following instructions that came with your meter. Contact a health care provider if:  Your blood glucose is at or above 240 mg/dL (13.3 mmol/L) for 2 days in a row.  You have been sick or have had a fever for 2 days or longer, and you are not getting better.  You have any of the following problems for more than 6 hours: ? You cannot eat or drink. ? You have nausea or vomiting. ? You have diarrhea. Get help right away if:  Your blood glucose is lower than 54 mg/dL (3 mmol/L).  You become confused or you have trouble thinking clearly.  You have difficulty breathing.  You have moderate or large ketone levels in your urine. Summary  Monitoring your blood sugar (glucose) is an important part of managing your diabetes (  diabetes mellitus).  Blood glucose monitoring involves checking your blood glucose as often as directed and keeping a record (log) of your results over time.  Your health care provider will set individualized treatment goals for you. Your goals will be based on your age, other medical conditions you have, and how you respond to diabetes treatment.  Every time you check your blood glucose, write down your result. Also write down any notes about things that may be affecting your blood glucose, such as your diet and exercise for the day. This information is not intended to replace advice given to you by your health care provider. Make sure you discuss any questions you have with your health care provider. Document Revised: 01/14/2018 Document Reviewed: 09/02/2015 Elsevier Patient Education  [place]. Correction Insulin  Correction insulin, also called corrective insulin or a supplemental dose, is a small amount of insulin that can be used to lower your blood sugar (glucose) if it is too high. You may be instructed to check your blood glucose at certain times of the  day and to use correction insulin as needed to lower your blood glucose to your target range. Correction insulin is primarily used as part of diabetes management. It may also be prescribed for people who do not have diabetes. What is a correction scale? A correction scale, also called a sliding scale, is prescribed by your health care provider to help you determine when you need correction insulin. Your correction scale is based on your individual treatment goals, and it has two parts:  Ranges of blood glucose levels.  How much correction insulin to give yourself if your blood sugar falls within a certain range. If your blood glucose is in your desired range, you will not need correction insulin and you should take your normal insulin dose. What type of insulin do I need? Your health care provider may prescribe rapid-acting or short-acting insulin for you to use as correction insulin. Rapid-acting insulin:  Starts working quickly, in as little as 5 minutes.  Can last for 3-6 hours.  Works well when taken right before a meal to quickly lower blood glucose. Short-acting insulin:  Starts working in about 30 minutes.  Can last for 6-8 hours.  Should be taken about 30 minutes before you start eating a meal. Talk with your health care provider or pharmacist about which type of correction insulin to take and when to take it. If you use insulin to control your diabetes, you should use correction insulin in addition to the longer-acting (basal) insulin that you normally use. How do I manage my blood glucose with correction insulin? Giving a correction dose  Check your blood glucose as directed by your health care provider.  Use your correction scale to find the range that your blood glucose is in.  Identify the units of insulin that match your blood glucose range.  Make sure you have food available that you can eat in the next 15-30 minutes, after your correction dose.  Give yourself the  dose of correction insulin that your health care provider has prescribed in your correction scale. Always make sure you are using the right type of insulin. ? If your correction insulin is rapid-acting, start eating a meal within 15 minutes after your correction dose to keep your blood glucose from getting too low. ? If your correction insulin is short-acting, start eating a meal within 30 minutes after your correction dose to keep your blood glucose from getting too low. Keeping  a blood glucose log   Write down your blood glucose test results and the amount of insulin that you give yourself. Do this every time you check blood glucose or take insulin. Bring this log with you to your medical visits. This information will help your health care provider to manage your medicines.  Note anything that may affect your blood glucose, such as: ? Changes in normal exercise or activity. ? Changes in your normal schedule, such as changes in your sleep routine, going on vacation, changing your diet, or holidays. ? New over-the-counter or prescription medicines. ? Illness, stress, or anxiety. ? Changes in the time that you took your medicine or insulin. ? Changes in your meals, such as skipping a meal, having a late meal, or dining out. ? Eating things that may affect blood glucose, such as snacks, meal portions that are larger than normal, drinks that contain sugar, or eating less than usual. What do I need to know about hyperglycemia and hypoglycemia? What is hyperglycemia? Hyperglycemia, also called high blood glucose, occurs when blood glucose is too high. Make sure you know the early signs of hyperglycemia, such as:  Increased thirst.  Hunger.  Feeling very tired.  Needing to urinate more often than usual.  Blurry vision. What is hypoglycemia? Hypoglycemia is also called low blood glucose. Be aware of "stacking" your insulin doses. This happens when you correct a high blood glucose by giving  yourself extra insulin too soon after a previous correction dose or mealtime dose. This may cause you to have too much insulin in your body and may put you at risk for hypoglycemia. Hypoglycemia occurs with a blood glucose level at or below 70 mg/dL (3.9 mmol/L). It is important to know the symptoms of hypoglycemia and treat it right away. Always have a 15-gram rapid-acting carbohydrate snack with you to treat low blood glucose. Family members and close friends should also know the symptoms and should understand how to treat hypoglycemia, in case you are not able to treat yourself. What are the symptoms of hypoglycemia? Hypoglycemia symptoms can include:  Hunger.  Anxiety.  Sweating and feeling clammy.  Confusion.  Dizziness or light-headedness.  Sleepiness.  Nausea.  Increased heart rate.  Headache.  Blurry vision.  Jerky movements that you cannot control (seizure).  Nightmares.  Tingling or numbness around the mouth, lips, or tongue.  A change in speech.  Decreased ability to concentrate.  A change in coordination.  Restless sleep.  Tremors or shakes.  Fainting.  Irritability. How do I treat hypoglycemia? If you are alert and able to swallow safely, follow the 15:15 rule:  Take 15 grams of a rapid-acting carbohydrate. Rapid-acting options include: ? 1 tube of glucose gel. ? 3 glucose pills. ? 6-8 pieces of hard candy. ? 4 oz (120 mL) of fruit juice. ? 4 oz (120 mL) of regular (not diet) soda.  Check your blood glucose 15 minutes after you take the carbohydrate. ? If the repeat blood glucose level is still at or below 70 mg/dL (3.9 mmol/L), take 15 grams of a carbohydrate again. ? If your blood glucose level does not increase above 70 mg/dL (3.9 mmol/L) after 3 tries, seek emergency medical care.  After your blood glucose level returns to normal, eat a meal or a snack within 1 hour.  How do I treat severe hypoglycemia? Severe hypoglycemia is when your  blood glucose level is at or below 54 mg/dL (3 mmol/L). Severe hypoglycemia is an emergency. Do not wait  to see if the symptoms will go away. Get medical help right away. Call your local emergency services (911 in the [place] Do not drive yourself to the hospital. If you have severe hypoglycemia and you cannot eat or drink, you may need an injection of glucagon. A family member or close friend should learn how to check your blood glucose and how to give you a glucagon injection. Ask your health care provider if you need to have an emergency glucagon injection kit available. Severe hypoglycemia may need to be treated in a hospital. The treatment may include getting glucose through an IV tube. You may also need treatment for the cause of your hypoglycemia. Why do I need correction insulin if I do not have diabetes? If you do not have diabetes, your health care provider may prescribe insulin because:  Keeping your blood glucose in the target range is important for your overall health.  You are taking medicines that cause your blood glucose to be higher than normal. Contact a health care provider if:  You develop low blood glucose that you are not able to treat yourself.  You have high blood glucose that you are not able to correct with correction insulin.  Your blood glucose is often too low.  You used emergency glucagon to treat low blood glucose. Get help right away if:  You become unresponsive. If this happens, someone else should call emergency services (911 in the [place] right away.  Your blood glucose is lower than 54 mg/dL (3.0 mmol/L).  You become confused or you have trouble thinking clearly.  You have difficulty breathing. Summary  Correction insulin is a small amount of insulin that can be used to lower your blood sugar (glucose) if it is too high.  Talk with your health care provider or pharmacist about which type of correction insulin to take and when to take it. If you use  insulin to control your diabetes, you should use correction insulin in addition to the longer-acting (basal) insulin that you normally use.  You may be instructed to check your blood glucose at certain times of the day and to use correction insulin as needed to lower your blood glucose to your target range. Always keep a log of your blood glucose values and the amount of insulin that you used.  It is important to know the symptoms of hypoglycemia and treat it right away. Always have a 15-gram rapid-acting carbohydrate snack with you to treat low blood glucose. Family members and close friends should also know the symptoms and should understand how to treat hypoglycemia, in case you are not able to treat yourself. This information is not intended to replace advice given to you by your health care provider. Make sure you discuss any questions you have with your health care provider. Document Revised: 04/02/2016 Document Reviewed: 12/20/2015 Elsevier Patient Education  2020 [employer]. COVID-19 Frequently Asked Questions COVID-19 (coronavirus disease) is an infection that is caused by a large family of viruses. Some viruses cause illness in people and others cause illness in animals like camels, cats, and bats. In some cases, the viruses that cause illness in animals can spread to humans. Where did the coronavirus come from? In December 2019, [place] told the [facility]) of several cases of lung disease (human respiratory illness). These cases were linked to an open seafood and livestock market in the city of [place]. The link to the seafood and livestock market suggests that the virus may have spread  from animals to humans. However, since that first outbreak in December, the virus has also been shown to spread from person to person. What is the name of the disease and the virus? Disease name Early on, this disease was called novel coronavirus. This is because scientists determined that  the disease was caused by a new (novel) respiratory virus. The World Health Organization [facility]) has now named the disease COVID-19, or coronavirus disease. Virus name The virus that causes the disease is called severe acute respiratory syndrome coronavirus 2 (SARS-CoV-2). More information on disease and virus naming World Health Organization [facility]): www.who.int/emergencies/diseases/novel-coronavirus-2019/technical-guidance/naming-the-coronavirus-disease-(covid-2019)-and-the-virus-that-causes-it Who is at risk for complications from coronavirus disease? Some people may be at higher risk for complications from coronavirus disease. This includes older adults and people who have chronic diseases, such as heart disease, diabetes, and lung disease. If you are at higher risk for complications, take these extra precautions:  Stay home as much as possible.  Avoid social gatherings and travel.  Avoid close contact with others. Stay at least 6 ft (2 m) away from others, if possible.  Wash your hands often with soap and water for at least 20 seconds.  Avoid touching your face, mouth, nose, or eyes.  Keep supplies on hand at home, such as food, medicine, and cleaning supplies.  If you must go out in public, wear a cloth face covering or face mask. Make sure your mask covers your nose and mouth. How does coronavirus disease spread? The virus that causes coronavirus disease spreads easily from person to person (is contagious). You may catch the virus by:  Breathing in droplets from an infected person. Droplets can be spread by a person breathing, speaking, singing, coughing, or sneezing.  Touching something, like a table or a doorknob, that was exposed to the virus (contaminated) and then touching your mouth, nose, or eyes. Can I get the virus from touching surfaces or objects? There is still a lot that we do not know about the virus that causes coronavirus disease. Scientists are basing a lot of  information on what they know about similar viruses, such as:  Viruses cannot generally survive on surfaces for long. They need a human body (host) to survive.  It is more likely that the virus is spread by close contact with people who are sick (direct contact), such as through: ? Shaking hands or hugging. ? Breathing in respiratory droplets that travel through the air. Droplets can be spread by a person breathing, speaking, singing, coughing, or sneezing.  It is less likely that the virus is spread when a person touches a surface or object that has the virus on it (indirect contact). The virus may be able to enter the body if the person touches a surface or object and then touches his or her face, eyes, nose, or mouth. Can a person spread the virus without having symptoms of the disease? It may be possible for the virus to spread before a person has symptoms of the disease, but this is most likely not the main way the virus is spreading. It is more likely for the virus to spread by being in close contact with people who are sick and breathing in the respiratory droplets spread by a person breathing, speaking, singing, coughing, or sneezing. What are the symptoms of coronavirus disease? Symptoms vary from person to person and can range from mild to severe. Symptoms may include:  Fever or chills.  Cough.  Difficulty breathing or feeling short of breath.  Headaches, body  aches, or muscle aches.  Runny or stuffy (congested) nose.  Sore throat.  New loss of taste or smell.  Nausea, vomiting, or diarrhea. These symptoms can appear anywhere from 2 to 14 days after you have been exposed to the virus. Some people may not have any symptoms. If you develop symptoms, call your health care provider. People with severe symptoms may need hospital care. Should I be tested for this virus? Your health care provider will decide whether to test you based on your symptoms, history of exposure, and your  risk factors. How does a health care provider test for this virus? Health care providers will collect samples to send for testing. Samples may include:  Taking a swab of fluid from the back of your nose and throat, your nose, or your throat.  Taking fluid from the lungs by having you cough up mucus (sputum) into a sterile cup.  Taking a blood sample. Is there a treatment or vaccine for this virus? Currently, there is no vaccine to prevent coronavirus disease. Also, there are no medicines like antibiotics or antivirals to treat the virus. A person who becomes sick is given supportive care, which means rest and fluids. A person may also relieve his or her symptoms by using over-the-counter medicines that treat sneezing, coughing, and runny nose. These are the same medicines that a person takes for the common cold. If you develop symptoms, call your health care provider. People with severe symptoms may need hospital care. What can I do to protect myself and my family from this virus?     You can protect yourself and your family by taking the same actions that you would take to prevent the spread of other viruses. Take the following actions:  Wash your hands often with soap and water for at least 20 seconds. If soap and water are not available, use alcohol-based hand sanitizer.  Avoid touching your face, mouth, nose, or eyes.  Cough or sneeze into a tissue, sleeve, or elbow. Do not cough or sneeze into your hand or the air. ? If you cough or sneeze into a tissue, throw it away immediately and wash your hands.  Disinfect objects and surfaces that you frequently touch every day.  Stay away from people who are sick.  Avoid going out in public, follow guidance from your state and local health authorities.  Avoid crowded indoor spaces. Stay at least 6 ft (2 m) away from others.  If you must go out in public, wear a cloth face covering or face mask. Make sure your mask covers your nose and  mouth.  Stay home if you are sick, except to get medical care. Call your health care provider before you get medical care. Your health care provider will tell you how long to stay home.  Make sure your vaccines are up to date. Ask your health care provider what vaccines you need. What should I do if I need to travel? Follow travel recommendations from your local health authority, the CDC, and WHO. Travel information and advice  Centers for Disease Control and Prevention (CDC): BodyEditor.hu  World Health Organization [facility]): ThirdIncome.ca Know the risks and take action to protect your health  You are at higher risk of getting coronavirus disease if you are traveling to areas with an outbreak or if you are exposed to travelers from areas with an outbreak.  Wash your hands often and practice good hygiene to lower the risk of catching or spreading the virus. What should  I do if I am sick? General instructions to stop the spread of infection  Wash your hands often with soap and water for at least 20 seconds. If soap and water are not available, use alcohol-based hand sanitizer.  Cough or sneeze into a tissue, sleeve, or elbow. Do not cough or sneeze into your hand or the air.  If you cough or sneeze into a tissue, throw it away immediately and wash your hands.  Stay home unless you must get medical care. Call your health care provider or local health authority before you get medical care.  Avoid public areas. Do not take public transportation, if possible.  If you can, wear a mask if you must go out of the house or if you are in close contact with someone who is not sick. Make sure your mask covers your nose and mouth. Keep your home clean  Disinfect objects and surfaces that are frequently touched every day. This may include: ? Counters and tables. ? Doorknobs and light switches. ? Sinks  and faucets. ? Electronics such as phones, remote controls, keyboards, computers, and tablets.  Wash dishes in hot, soapy water or use a dishwasher. Air-dry your dishes.  Wash laundry in hot water. Prevent infecting other household members  Let healthy household members care for children and pets, if possible. If you have to care for children or pets, wash your hands often and wear a mask.  Sleep in a different bedroom or bed, if possible.  Do not share personal items, such as razors, toothbrushes, deodorant, combs, brushes, towels, and washcloths. Where to find more information Centers for Disease Control and Prevention (CDC)  Information and news updates: https://www.butler-gonzalez.com/ World Health Organization [facility])  Information and news updates: MissExecutive.com.ee  Coronavirus health topic: https://www.castaneda.info/  Questions and answers on COVID-19: OpportunityDebt.at  Global tracker: who.sprinklr.com American Academy of Pediatrics (AAP)  Information for families: www.healthychildren.org/English/health-issues/conditions/chest-lungs/Pages/2019-Novel-Coronavirus.aspx The coronavirus situation is changing rapidly. Check your local health authority website or the CDC and [facility] websites for updates and news. When should I contact a health care provider?  Contact your health care provider if you have symptoms of an infection, such as fever or cough, and you: ? Have been near anyone who is known to have coronavirus disease. ? Have come into contact with a person who is suspected to have coronavirus disease. ? Have traveled to an area where there is an outbreak of COVID-19. When should I get emergency medical care?  Get help right away by calling your local emergency services (911 in the [place] if you have: ? Trouble breathing. ? Pain or pressure in your chest. ? Confusion. ? Blue-tinged lips and  fingernails. ? Difficulty waking from sleep. ? Symptoms that get worse. Let the emergency medical personnel know if you think you have coronavirus disease. Summary  A new respiratory virus is spreading from person to person and causing COVID-19 (coronavirus disease).  The virus that causes COVID-19 appears to spread easily. It spreads from one person to another through droplets from breathing, speaking, singing, coughing, or sneezing.  Older adults and those with chronic diseases are at higher risk of disease. If you are at higher risk for complications, take extra precautions.  There is currently no vaccine to prevent coronavirus disease. There are no medicines, such as antibiotics or antivirals, to treat the virus.  You can protect yourself and your family by washing your hands often, avoiding touching your face, and covering your coughs and sneezes. This information is not intended  to replace advice given to you by your health care provider. Make sure you discuss any questions you have with your health care provider. Document Revised: 01/20/2019 Document Reviewed: 07/19/2018 Elsevier Patient Education  [place] Can Do to Manage Your COVID-19 Symptoms at Home If you have possible or confirmed COVID-19: 1. Stay home from work and school. And stay away from other public places. If you must go out, avoid using any kind of public transportation, ridesharing, or taxis. 2. Monitor your symptoms carefully. If your symptoms get worse, call your healthcare provider immediately. 3. Get rest and stay hydrated. 4. If you have a medical appointment, call the healthcare provider ahead of time and tell them that you have or may have COVID-19. 5. For medical emergencies, call 911 and notify the dispatch personnel that you have or may have COVID-19. 6. Cover your cough and sneezes with a tissue or use the inside of your elbow. 7. Wash your hands often with soap and water for at  least 20 seconds or clean your hands with an alcohol-based hand sanitizer that contains at least 60% alcohol. 8. As much as possible, stay in a specific room and away from other people in your home. Also, you should use a separate bathroom, if available. If you need to be around other people in or outside of the home, wear a mask. 9. Avoid sharing personal items with other people in your household, like dishes, towels, and bedding. 10. Clean all surfaces that are touched often, like counters, tabletops, and doorknobs. Use household cleaning sprays or wipes according to the label instructions. michellinders.com 10/05/2018 This information is not intended to replace advice given to you by your health care provider. Make sure you discuss any questions you have with your health care provider. Document Revised: 03/09/2019 Document Reviewed: 03/09/2019 Elsevier Patient Education  2020 [place].   COVID-19 COVID-19 is a respiratory infection that is caused by a virus called severe acute respiratory syndrome coronavirus 2 (SARS-CoV-2). The disease is also known as coronavirus disease or novel coronavirus. In some people, the virus may not cause any symptoms. In others, it may cause a serious infection. The infection can get worse quickly and can lead to complications, such as:  Pneumonia, or infection of the lungs.  Acute respiratory distress syndrome or ARDS. This is a condition in which fluid build-up in the lungs prevents the lungs from filling with air and passing oxygen into the blood.  Acute respiratory failure. This is a condition in which there is not enough oxygen passing from the lungs to the body or when carbon dioxide is not passing from the lungs out of the body.  Sepsis or septic shock. This is a serious bodily reaction to an infection.  Blood clotting problems.  Secondary infections due to bacteria or fungus.  Organ failure. This is when your body's organs stop working. The  virus that causes COVID-19 is contagious. This means that it can spread from person to person through droplets from coughs and sneezes (respiratory secretions). What are the causes? This illness is caused by a virus. You may catch the virus by:  Breathing in droplets from an infected person. Droplets can be spread by a person breathing, speaking, singing, coughing, or sneezing.  Touching something, like a table or a doorknob, that was exposed to the virus (contaminated) and then touching your mouth, nose, or eyes. What increases the risk? Risk for infection You are more likely to be infected  with this virus if you:  Are within 6 feet (2 meters) of a person with COVID-19.  Provide care for or live with a person who is infected with COVID-19.  Spend time in crowded indoor spaces or live in shared housing. Risk for serious illness You are more likely to become seriously ill from the virus if you:  Are 81 years of age or older. The higher your age, the more you are at risk for serious illness.  Live in a nursing home or long-term care facility.  Have cancer.  Have a long-term (chronic) disease such as: ? Chronic lung disease, including chronic obstructive pulmonary disease or asthma. ? A long-term disease that lowers your body's ability to fight infection (immunocompromised). ? Heart disease, including heart failure, a condition in which the arteries that lead to the heart become narrow or blocked (coronary artery disease), a disease which makes the heart muscle thick, weak, or stiff (cardiomyopathy). ? Diabetes. ? Chronic kidney disease. ? Sickle cell disease, a condition in which red blood cells have an abnormal "sickle" shape. ? Liver disease.  Are obese. What are the signs or symptoms? Symptoms of this condition can range from mild to severe. Symptoms may appear any time from 2 to 14 days after being exposed to the virus. They include:  A fever or chills.  A  cough.  Difficulty breathing.  Headaches, body aches, or muscle aches.  Runny or stuffy (congested) nose.  A sore throat.  New loss of taste or smell. Some people may also have stomach problems, such as nausea, vomiting, or diarrhea. Other people may not have any symptoms of COVID-19. How is this diagnosed? This condition may be diagnosed based on:  Your signs and symptoms, especially if: ? You live in an area with a COVID-19 outbreak. ? You recently traveled to or from an area where the virus is common. ? You provide care for or live with a person who was diagnosed with COVID-19. ? You were exposed to a person who was diagnosed with COVID-19.  A physical exam.  Lab tests, which may include: ? Taking a sample of fluid from the back of your nose and throat (nasopharyngeal fluid), your nose, or your throat using a swab. ? A sample of mucus from your lungs (sputum). ? Blood tests.  Imaging tests, which may include, X-rays, CT scan, or ultrasound. How is this treated? At present, there is no medicine to treat COVID-19. Medicines that treat other diseases are being used on a trial basis to see if they are effective against COVID-19. Your health care provider will talk with you about ways to treat your symptoms. For most people, the infection is mild and can be managed at home with rest, fluids, and over-the-counter medicines. Treatment for a serious infection usually takes places in a hospital intensive care unit (ICU). It may include one or more of the following treatments. These treatments are given until your symptoms improve.  Receiving fluids and medicines through an IV.  Supplemental oxygen. Extra oxygen is given through a tube in the nose, a face mask, or a hood.  Positioning you to lie on your stomach (prone position). This makes it easier for oxygen to get into the lungs.  Continuous positive airway pressure (CPAP) or bi-level positive airway pressure (BPAP) machine. This  treatment uses mild air pressure to keep the airways open. A tube that is connected to a motor delivers oxygen to the body.  Ventilator. This treatment moves air into  and out of the lungs by using a tube that is placed in your windpipe.  Tracheostomy. This is a procedure to create a hole in the neck so that a breathing tube can be inserted.  Extracorporeal membrane oxygenation (ECMO). This procedure gives the lungs a chance to recover by taking over the functions of the heart and lungs. It supplies oxygen to the body and removes carbon dioxide. Follow these instructions at home: Lifestyle  If you are sick, stay home except to get medical care. Your health care provider will tell you how long to stay home. Call your health care provider before you go for medical care.  Rest at home as told by your health care provider.  Do not use any products that contain nicotine or tobacco, such as cigarettes, e-cigarettes, and chewing tobacco. If you need help quitting, ask your health care provider.  Return to your normal activities as told by your health care provider. Ask your health care provider what activities are safe for you. General instructions  Take over-the-counter and prescription medicines only as told by your health care provider.  Drink enough fluid to keep your urine pale yellow.  Keep all follow-up visits as told by your health care provider. This is important. How is this prevented?  There is no vaccine to help prevent COVID-19 infection. However, there are steps you can take to protect yourself and others from this virus. To protect yourself:   Do not travel to areas where COVID-19 is a risk. The areas where COVID-19 is reported change often. To identify high-risk areas and travel restrictions, check the CDC travel website: FatFares.com.br  If you live in, or must travel to, an area where COVID-19 is a risk, take precautions to avoid infection. ? Stay away from  people who are sick. ? Wash your hands often with soap and water for 20 seconds. If soap and water are not available, use an alcohol-based hand sanitizer. ? Avoid touching your mouth, face, eyes, or nose. ? Avoid going out in public, follow guidance from your state and local health authorities. ? If you must go out in public, wear a cloth face covering or face mask. Make sure your mask covers your nose and mouth. ? Avoid crowded indoor spaces. Stay at least 6 feet (2 meters) away from others. ? Disinfect objects and surfaces that are frequently touched every day. This may include:  Counters and tables.  Doorknobs and light switches.  Sinks and faucets.  Electronics, such as phones, remote controls, keyboards, computers, and tablets. To protect others: If you have symptoms of COVID-19, take steps to prevent the virus from spreading to others.  If you think you have a COVID-19 infection, contact your health care provider right away. Tell your health care team that you think you may have a COVID-19 infection.  Stay home. Leave your house only to seek medical care. Do not use public transport.  Do not travel while you are sick.  Wash your hands often with soap and water for 20 seconds. If soap and water are not available, use alcohol-based hand sanitizer.  Stay away from other members of your household. Let healthy household members care for children and pets, if possible. If you have to care for children or pets, wash your hands often and wear a mask. If possible, stay in your own room, separate from others. Use a different bathroom.  Make sure that all people in your household wash their hands well and often.  Cough  or sneeze into a tissue or your sleeve or elbow. Do not cough or sneeze into your hand or into the air.  Wear a cloth face covering or face mask. Make sure your mask covers your nose and mouth. Where to find more information  Centers for Disease Control and Prevention:  PurpleGadgets.be  World Health Organization: https://www.castaneda.info/ Contact a health care provider if:  You live in or have traveled to an area where COVID-19 is a risk and you have symptoms of the infection.  You have had contact with someone who has COVID-19 and you have symptoms of the infection. Get help right away if:  You have trouble breathing.  You have pain or pressure in your chest.  You have confusion.  You have bluish lips and fingernails.  You have difficulty waking from sleep.  You have symptoms that get worse. These symptoms may represent a serious problem that is an emergency. Do not wait to see if the symptoms will go away. Get medical help right away. Call your local emergency services (911 in the [place] Do not drive yourself to the hospital. Let the emergency medical personnel know if you think you have COVID-19. Summary  COVID-19 is a respiratory infection that is caused by a virus. It is also known as coronavirus disease or novel coronavirus. It can cause serious infections, such as pneumonia, acute respiratory distress syndrome, acute respiratory failure, or sepsis.  The virus that causes COVID-19 is contagious. This means that it can spread from person to person through droplets from breathing, speaking, singing, coughing, or sneezing.  You are more likely to develop a serious illness if you are 68 years of age or older, have a weak immune system, live in a nursing home, or have chronic disease.  There is no medicine to treat COVID-19. Your health care provider will talk with you about ways to treat your symptoms.  Take steps to protect yourself and others from infection. Wash your hands often and disinfect objects and surfaces that are frequently touched every day. Stay away from people who are sick and wear a mask if you are sick. This information is not intended to replace advice given to you by your health care  provider. Make sure you discuss any questions you have with your health care provider. Document Revised: 01/20/2019 Document Reviewed: 04/28/2018 Elsevier Patient Education  2020 [place] Under Monitoring Name: Micheal Graham  Location: [place] [address]   Infection Prevention Recommendations for Individuals Confirmed to have, or Being Evaluated for, 2019 Novel Coronavirus (COVID-19) Infection Who Receive Care at Home  Individuals who are confirmed to have, or are being evaluated for, COVID-19 should follow the prevention steps below until a healthcare provider or local or state health department says they can return to normal activities.  Stay home except to get medical care You should restrict activities outside your home, except for getting medical care. Do not go to work, school, or public areas, and do not use public transportation or taxis.  Call ahead before visiting your doctor Before your medical appointment, call the healthcare provider and tell them that you have, or are being evaluated for, COVID-19 infection. This will help the healthcare provider's office take steps to keep other people from getting infected. Ask your healthcare provider to call the local or state health department.  Monitor your symptoms Seek prompt medical attention if your illness is worsening (e.g., difficulty breathing). Before going to  your medical appointment, call the healthcare provider and tell them that you have, or are being evaluated for, COVID-19 infection. Ask your healthcare provider to call the local or state health department.  Wear a facemask You should wear a facemask that covers your nose and mouth when you are in the same room with other people and when you visit a healthcare provider. People who live with or visit you should also wear a facemask while they are in the same room with you.  Separate yourself from other people in your  home As much as possible, you should stay in a different room from other people in your home. Also, you should use a separate bathroom, if available.  Avoid sharing household items You should not share dishes, drinking glasses, cups, eating utensils, towels, bedding, or other items with other people in your home. After using these items, you should wash them thoroughly with soap and water.  Cover your coughs and sneezes Cover your mouth and nose with a tissue when you cough or sneeze, or you can cough or sneeze into your sleeve. Throw used tissues in a lined trash can, and immediately wash your hands with soap and water for at least 20 seconds or use an alcohol-based hand rub.  Wash your [facility] your hands often and thoroughly with soap and water for at least 20 seconds. You can use an alcohol-based hand sanitizer if soap and water are not available and if your hands are not visibly dirty. Avoid touching your eyes, nose, and mouth with unwashed hands.   Prevention Steps for Caregivers and Household Members of Individuals Confirmed to have, or Being Evaluated for, COVID-19 Infection Being Cared for in the Home  If you live with, or provide care at home for, a person confirmed to have, or being evaluated for, COVID-19 infection please follow these guidelines to prevent infection:  Follow healthcare provider's instructions Make sure that you understand and can help the patient follow any healthcare provider instructions for all care.  Provide for the patient's basic needs You should help the patient with basic needs in the home and provide support for getting groceries, prescriptions, and other personal needs.  Monitor the patient's symptoms If they are getting sicker, call his or her medical provider and tell them that the patient has, or is being evaluated for, COVID-19 infection. This will help the healthcare provider's office take steps to keep other people from getting  infected. Ask the healthcare provider to call the local or state health department.  Limit the number of people who have contact with the patient  If possible, have only one caregiver for the patient.  Other household members should stay in another home or place of residence. If this is not possible, they should stay  in another room, or be separated from the patient as much as possible. Use a separate bathroom, if available.  Restrict visitors who do not have an essential need to be in the home.  Keep older adults, very young children, and other sick people away from the patient Keep older adults, very young children, and those who have compromised immune systems or chronic health conditions away from the patient. This includes people with chronic heart, lung, or kidney conditions, diabetes, and cancer.  Ensure good ventilation Make sure that shared spaces in the home have good air flow, such as from an air conditioner or an opened window, weather permitting.  Wash your hands often  Wash your hands often and thoroughly  with soap and water for at least 20 seconds. You can use an alcohol based hand sanitizer if soap and water are not available and if your hands are not visibly dirty.  Avoid touching your eyes, nose, and mouth with unwashed hands.  Use disposable paper towels to dry your hands. If not available, use dedicated cloth towels and replace them when they become wet.  Wear a facemask and gloves  Wear a disposable facemask at all times in the room and gloves when you touch or have contact with the patient's blood, body fluids, and/or secretions or excretions, such as sweat, saliva, sputum, nasal mucus, vomit, urine, or feces.  Ensure the mask fits over your nose and mouth tightly, and do not touch it during use.  Throw out disposable facemasks and gloves after using them. Do not reuse.  Wash your hands immediately after removing your facemask and gloves.  If your personal  clothing becomes contaminated, carefully remove clothing and launder. Wash your hands after handling contaminated clothing.  Place all used disposable facemasks, gloves, and other waste in a lined container before disposing them with other household waste.  Remove gloves and wash your hands immediately after handling these items.  Do not share dishes, glasses, or other household items with the patient  Avoid sharing household items. You should not share dishes, drinking glasses, cups, eating utensils, towels, bedding, or other items with a patient who is confirmed to have, or being evaluated for, COVID-19 infection.  After the person uses these items, you should wash them thoroughly with soap and water.  Wash laundry thoroughly  Immediately remove and wash clothes or bedding that have blood, body fluids, and/or secretions or excretions, such as sweat, saliva, sputum, nasal mucus, vomit, urine, or feces, on them.  Wear gloves when handling laundry from the patient.  Read and follow directions on labels of laundry or clothing items and detergent. In general, wash and dry with the warmest temperatures recommended on the label.  Clean all areas the individual has used often  Clean all touchable surfaces, such as counters, tabletops, doorknobs, bathroom fixtures, toilets, phones, keyboards, tablets, and bedside tables, every day. Also, clean any surfaces that may have blood, body fluids, and/or secretions or excretions on them.  Wear gloves when cleaning surfaces the patient has come in contact with.  Use a diluted bleach solution (e.g., dilute bleach with 1 part bleach and 10 parts water) or a household disinfectant with a label that says EPA-registered for coronaviruses. To make a bleach solution at home, add 1 tablespoon of bleach to 1 quart (4 cups) of water. For a larger supply, add  cup of bleach to 1 gallon (16 cups) of water.  Read labels of cleaning products and follow  recommendations provided on product labels. Labels contain instructions for safe and effective use of the cleaning product including precautions you should take when applying the product, such as wearing gloves or eye protection and making sure you have good ventilation during use of the product.  Remove gloves and wash hands immediately after cleaning.  Monitor yourself for signs and symptoms of illness Caregivers and household members are considered close contacts, should monitor their health, and will be asked to limit movement outside of the home to the extent possible. Follow the monitoring steps for close contacts listed on the symptom monitoring form.   ? If you have additional questions, contact your local health department or call the epidemiologist on call at [phone] (available 24/7). ? This  guidance is subject to change. For the most up-to-date guidance from [facility], please refer to their website: [email or web address]

## 2020-01-18 NOTE — Discharge Summary (Addendum)
Physician Discharge Summary  Micheal Graham LTJ:[id number] DOB: [birth year] DOA: 01/10/2020  PCP: [name], MD  Admit date: 01/10/2020 Discharge date: 01/18/2020  Admitted From: Home Disposition: Home  Recommendations for Outpatient Follow-up:  1. Follow up with PCP in 1-2 weeks 2. Continue prednisone taper on discharge 3. Discontinued home metoprolol, amlodipine/benazepril combination due to borderline hypotension 4. Will need close follow-up for elevated hemoglobin A1c, likely new diagnosis diabetes mellitus 5. Started on insulin sliding scale on discharge; patient and family want to follow-up with PCP prior to initiating any further diabetic regimen 6. Please obtain BMP/CBC in one week 7. Recommend Covid-19 vaccination 45 days from initial diagnosis  Home Health: No Equipment/Devices: Oxygen, 2 L per nasal cannula  Discharge Condition: Stable CODE STATUS: Full code Diet recommendation: Heart healthy/modified carbohydrate diet  History of present illness:  Micheal Graham is a 64 year old male with past medical history notable for essential hypertension, CKD stage IIIa who presented to the ED with progressive shortness of breath, cough, diarrhea and loss of appetite.  He is fully vaccinated against Covid-19.  Recent history of upper respiratory symptoms with associated malaise.  Reports negative rapid Covid antigen test/Covid PCR - 12/31/2019.  He was treated with prednisone and azithromycin.  His symptoms nearly resolved but was retested for Covid-19 on 01/08/2020 and reports that this was positive.  In the ED he was found to be hypoxic with SPO2 in the 80s on room air and was slightly Tachypneic.  Chest x-ray demonstrated infiltrate in the left mid/lower lung fields.  TRH was consulted for admission and further treatment of acute hypoxic respiratory failure secondary to Covid-19 viral pneumonia.  Hospital course:  Acute hypoxic respiratory failure secondary to acute Covid-19  viral pneumonia during the ongoing 2020 Covid 19 Pandemic - POA Patient presenting to the ED with progressive shortness of breath, malaise, nausea/diarrhea and loss of appetite.  Chest x-ray with findings consistent with viral multifocal pneumonia. COVID PCR positive on 01/08/2020.  CRP elevated 24.2 on admission.  Patient completed 5-day course of remdesivir on 01/15/2020.  Patient was started on IV steroids which he will transition to oral steroids to complete his steroid taper on discharge.  Patient initially requiring submental oxygen which was titrated off at rest; although will require 2 L nasal cannula with exertion.  Continue home isolation for 14 days from initial diagnosis and recommends Covid-19 vaccination 45 days from diagnosis.  The treatment plan and use of medications and known side effects were discussed with patient/family. Some of the medications used are based on case reports/anecdotal data.  All other medications being used in the management of COVID-19 based on limited study data.  Complete risks and long-term side effects are unknown, however in the best clinical judgment they seem to be of some benefit.  Patient wanted to proceed with treatment options provided.  Elevated D-dimer Venous duplex bilateral lower extremity ultrasound negative for DVT.  Acute renal failure on CKD stage IIIa Etiology likely secondary to dehydration with poor oral intake in the preceding days hospitalization.  Baseline creatinine 1.46 11/07/2018.  Creatinine admission 3.39, improved to 1.41 at time of discharge with treatment of Covid-19 and IV fluid hydration.  Bacterial pneumonia Procalcitonin elevated at 7.46 at time admission, suggestive of a bacterial component.  Completed 5-day course of azithromycin/ceftriaxone.  Transaminitis Etiology likely secondary to Covid-19 viral infection.  Now resolved.  Hyponatremia: Resolved Etiology likely secondary to dehydration, improved with volume  resuscitation.  Hyperkalemia Etiology likely secondary to acute renal  failure.    Patient was treated with [facility] during hospitalization.  Recommend repeat BMP in next PCP visit.  Essential hypertension On metoprolol succinate 50 mg p.o. daily, amlodipine-benazepril 5-20 mg p.o. daily at home.  Patient's home antihypertensives were held and discontinued at time of discharge due to borderline hypotension.  Patient's blood pressure at time of discharge 105/59.  Recommend follow-up with PCP.  Depression/anxiety: Continue sertraline   T2DM, new diagnosis Hemoglobin A1c 8.3, no previous diagnosis of diabetes.  Hyperglycemia likely exacerbated by use of high-dose steroids for treatment of Covid-19 viral pneumonia as above during hospitalization.  Patient was requiring Lantus 22 units subcutaneously twice daily with NovoLog 10 units 3 times daily AC during hospitalization.  After discussion with patient and family, will discharge on insulin sliding scale for further coverage while on prednisone taper.  Patient wishes to follow-up with PCP regarding further treatment of likely new onset diabetes mellitus.  Discharge Diagnoses:  Principal Problem:   Acute respiratory disease due to COVID-19 virus Active Problems:   Hypertension   Pneumonia   Acute respiratory failure with hypoxia (HCC)   Acute renal failure superimposed on stage 3a chronic kidney disease [facility])    Discharge Instructions  Discharge Instructions    Call MD for:  difficulty breathing, headache or visual disturbances   Complete by: As directed    Call MD for:  extreme fatigue   Complete by: As directed    Call MD for:  persistant dizziness or light-headedness   Complete by: As directed    Call MD for:  persistant nausea and vomiting   Complete by: As directed    Call MD for:  severe uncontrolled pain   Complete by: As directed    Call MD for:  temperature >100.4   Complete by: As directed    Diet - low sodium heart  healthy   Complete by: As directed    Diet Carb Modified   Complete by: As directed    Increase activity slowly   Complete by: As directed      Allergies as of 01/18/2020      Reactions   Gluten Meal Diarrhea      Medication List    STOP taking these medications   amLODipine-benazepril 5-20 MG capsule Commonly known as: LOTREL   azithromycin 250 MG tablet Commonly known as: ZITHROMAX   dexamethasone 6 MG tablet Commonly known as: DECADRON   metoprolol succinate 50 MG 24 hr tablet Commonly known as: TOPROL-XL     TAKE these medications   acetaminophen 325 MG tablet Commonly known as: TYLENOL Take 2 tablets (650 mg total) by mouth every 6 (six) hours as needed for mild pain or headache (fever >/= 101).   blood glucose meter kit and supplies Kit Dispense based on patient and insurance preference. Use up to four times daily as directed. (FOR ICD-9 250.00, 250.01).   Calcium Carb-Cholecalciferol [phone] MG-UNIT Tabs Take 1 tablet by mouth daily.   diclofenac Sodium 1 % Gel Commonly known as: VOLTAREN Apply 4 g topically 4 (four) times daily. What changed:   when to take this  reasons to take this   diphenhydramine-acetaminophen 25-500 MG Tabs tablet Commonly known as: TYLENOL PM Take 1 tablet by mouth at bedtime as needed (sleep).   fexofenadine 180 MG tablet Commonly known as: ALLEGRA Take 180 mg by mouth daily.   insulin aspart 100 UNIT/ML FlexTouch Pen Commonly known as: FIASP Inject 2-10 Units into the skin 4 (four) times daily -  before meals and at bedtime. Glucose 150-200 2 units, 201-250 4 units, 251-300 6 units, 301-350 8 units, 351-400 10 units   magnesium oxide 400 MG tablet Commonly known as: MAG-OX Take 400 mg by mouth daily.   Pen Needles 3/16" 31G X 5 MM Misc Use as directed with insulin pen   predniSONE 10 MG tablet Commonly known as: DELTASONE Take 5 tablets (50 mg total) by mouth daily for 1 day, THEN 4 tablets (40 mg total) daily  for 2 days, THEN 3 tablets (30 mg total) daily for 2 days, THEN 2 tablets (20 mg total) daily for 2 days, THEN 1 tablet (10 mg total) daily for 2 days. Start taking on: January 19, 2020   PRENATAL VITAMINS PO Take 1 capsule by mouth daily.   Probiotic Caps Take 1 capsule by mouth daily.   sertraline 100 MG tablet Commonly known as: ZOLOFT Take 1 tablet (100 mg total) by mouth daily.   sildenafil 100 MG tablet Commonly known as: Viagra Take 0.5-1 tablets (50-100 mg total) by mouth daily as needed for erectile dysfunction.   Testosterone 20.25 MG/ACT (1.62%) Gel USE 2 PUMPS ON EACH ARM DAILY What changed: See the new instructions.   Vitamin D 50 MCG (2000 UT) Caps Take 2,000 Units by mouth daily.            Durable Medical Equipment  (From admission, onward)         Start     Ordered   01/17/20 1324  For home use only DME oxygen  Once       Question Answer Comment  Length of Need 12 Months   Mode or (Route) Nasal cannula   Liters per Minute 2   Frequency Continuous (stationary and portable oxygen unit needed)   Oxygen conserving device Yes   Oxygen delivery system Gas      01/17/20 1324          Follow-up Information    Burchette, Alinda Sierras, MD. Schedule an appointment as soon as possible for a visit in 1 week(s).   Specialty: Family Medicine Contact information: [address] [place] [address] [phone]              Allergies  Allergen Reactions  . Gluten Meal Diarrhea    Consultations:  none   Procedures/Studies: DG Chest 2 View  Result Date: 01/09/2020 CLINICAL DATA:  Cough and chest tightness.  Fever. EXAM: CHEST - 2 VIEW COMPARISON:  Aug 28, 2017 FINDINGS: There is slight atelectatic change in the left mid and lower lung regions. There is no edema or airspace opacity. Heart size and pulmonary vascularity are normal. No adenopathy. There is aortic atherosclerosis. There is degenerative change in the thoracic spine.  IMPRESSION: Slight atelectatic change left mid and lower lung regions. No edema or airspace opacity. Heart size normal. Aortic Atherosclerosis (ICD10-I70.0). Electronically Signed   By: Lowella Grip III M.D.   On: 01/09/2020 11:22   DG Chest Port 1 View  Result Date: 01/14/2020 CLINICAL DATA:  Pneumonia due to COVID-19. EXAM: PORTABLE CHEST 1 VIEW COMPARISON:  Chest radiograph 01/10/2020. FINDINGS: Stable cardiac and mediastinal contours. Interval worsening of left mid lower lung and right mid lung patchy areas of consolidation. No pleural effusion or pneumothorax. Thoracic spine degenerative changes. IMPRESSION: Interval worsening bilateral mid and lower lung patchy areas of consolidation. Electronically Signed   By: Lovey Newcomer M.D.   On: 01/14/2020 10:16   DG Chest Portable 1 View  Result  Date: 01/10/2020 CLINICAL DATA:  Chest pain EXAM: PORTABLE CHEST 1 VIEW COMPARISON:  01/08/2020 FINDINGS: Cardiac shadow is stable. Aortic calcifications are again seen. Right lung is clear. Increasing patchy opacities are noted in the left lung consistent with developing infiltrate. No other focal abnormality is noted. IMPRESSION: Developing infiltrate the left mid and lower lung. Electronically Signed   By: Inez Catalina M.D.   On: 01/10/2020 22:57   VAS [place] LOWER EXTREMITY VENOUS (DVT)  Result Date: 01/15/2020  Lower Venous DVTStudy Indications: Elevated d-dimer with Covid.  Comparison Study: No prior. Performing Technologist: Oda Cogan RDMS, RVT  Examination Guidelines: A complete evaluation includes B-mode imaging, spectral Doppler, color Doppler, and power Doppler as needed of all accessible portions of each vessel. Bilateral testing is considered an integral part of a complete examination. Limited examinations for reoccurring indications may be performed as noted. The reflux portion of the exam is performed with the patient in reverse Trendelenburg.   +---------+---------------+---------+-----------+----------+--------------+ RIGHT    CompressibilityPhasicitySpontaneityPropertiesThrombus Aging +---------+---------------+---------+-----------+----------+--------------+ CFV      Full           Yes      Yes                                 +---------+---------------+---------+-----------+----------+--------------+ SFJ      Full                                                        +---------+---------------+---------+-----------+----------+--------------+ FV Prox  Full                                                        +---------+---------------+---------+-----------+----------+--------------+ FV Mid   Full                                                        +---------+---------------+---------+-----------+----------+--------------+ FV DistalFull                                                        +---------+---------------+---------+-----------+----------+--------------+ PFV      Full                                                        +---------+---------------+---------+-----------+----------+--------------+ POP      Full           Yes      Yes                                 +---------+---------------+---------+-----------+----------+--------------+ PTV      Full                                                        +---------+---------------+---------+-----------+----------+--------------+  PERO     Full                                                        +---------+---------------+---------+-----------+----------+--------------+   +---------+---------------+---------+-----------+----------+--------------+ LEFT     CompressibilityPhasicitySpontaneityPropertiesThrombus Aging +---------+---------------+---------+-----------+----------+--------------+ CFV      Full           Yes      Yes                                  +---------+---------------+---------+-----------+----------+--------------+ SFJ      Full                                                        +---------+---------------+---------+-----------+----------+--------------+ FV Prox  Full                                                        +---------+---------------+---------+-----------+----------+--------------+ FV Mid   Full                                                        +---------+---------------+---------+-----------+----------+--------------+ FV DistalFull                                                        +---------+---------------+---------+-----------+----------+--------------+ PFV      Full                                                        +---------+---------------+---------+-----------+----------+--------------+ POP      Full           Yes      Yes                                 +---------+---------------+---------+-----------+----------+--------------+ PTV      Full                                                        +---------+---------------+---------+-----------+----------+--------------+ PERO     Full                                                        +---------+---------------+---------+-----------+----------+--------------+  Summary: BILATERAL: - No evidence of deep vein thrombosis seen in the lower extremities, bilaterally. -No evidence of popliteal cyst, bilaterally.   *See table(s) above for measurements and observations. Electronically signed by Monica Martinez MD on 01/15/2020 at 5:31:08 PM.    Final      Subjective: Patient seen and examined at bedside, resting comfortably.  Off of oxygen while at rest, but does require 2 L with any type of exertion/ambulation.  Discussed with concern about elevated hemoglobin A1c and newly diagnosed diabetes mellitus.  Patient and daughter wishes to follow-up with PCP for further guidance.  No other questions or  concerns at this time.  Ready for discharge home.  Denies headache, no fever/chills/night sweats, no nausea/vomiting/diarrhea, no chest pain, no palpitations, no abdominal pain, no weakness, no fatigue, no paresthesias.  No acute events overnight per nursing staff.  Discharge Exam: Vitals:   01/18/20 0509 01/18/20 0718  BP: (!) 105/59 104/71  Pulse: 63 69  Resp: 17 19  Temp: 97.8 F (36.6 C) 98.2 F (36.8 C)  SpO2: 91% 90%   Vitals:   01/17/20 1506 01/17/20 2105 01/18/20 0509 01/18/20 0718  BP: 123/73 113/69 (!) 105/59 104/71  Pulse: 75 75 63 69  Resp: 18 18 17 19   Temp: 98.3 F (36.8 C) 98.3 F (36.8 C) 97.8 F (36.6 C) 98.2 F (36.8 C)  TempSrc: Oral Oral Oral Oral  SpO2: 90% 93% 91% 90%  Weight:      Height:        General: Pt is alert, awake, not in acute distress Cardiovascular: RRR, S1/S2 +, no rubs, no gallops Respiratory: CTA bilaterally, no wheezing, no rhonchi, on room air at rest but requiring 2 L per nasal cannula with ambulation Abdominal: Soft, NT, ND, bowel sounds + Extremities: no edema, no cyanosis    The results of significant diagnostics from this hospitalization (including imaging, microbiology, ancillary and laboratory) are listed below for reference.     Microbiology: Recent Results (from the past 240 hour(s))  Blood Culture (routine x 2)     Status: None   Collection Time: 01/11/20  4:45 AM   Specimen: BLOOD  Result Value Ref Range Status   Specimen Description BLOOD SITE NOT SPECIFIED  Final   Special Requests   Final    BOTTLES DRAWN AEROBIC AND ANAEROBIC Blood Culture adequate volume   Culture   Final    NO GROWTH 5 DAYS Performed at [facility], [address] [place], [place] [address]    Report Status 01/16/2020 FINAL  Final  Respiratory Panel by RT PCR (Flu A&B, Covid) - Nasopharyngeal Swab     Status: Abnormal   Collection Time: 01/11/20  5:27 AM   Specimen: Nasopharyngeal Swab  Result Value Ref Range Status   SARS  Coronavirus 2 by RT PCR POSITIVE (A) NEGATIVE Final    Comment: emailed L. Berdik RN 8:30 01/11/20 ([email or web address]) (NOTE) SARS-CoV-2 target nucleic acids are DETECTED.  SARS-CoV-2 RNA is generally detectable in upper respiratory specimens  during the acute phase of infection. Positive results are indicative of the presence of the identified virus, but do not rule out bacterial infection or co-infection with other pathogens not detected by the test. Clinical correlation with patient history and other diagnostic information is necessary to determine patient infection status. The expected result is Negative.  Fact Sheet for Patients:  PinkCheek.be  Fact Sheet for Healthcare Providers: GravelBags.it  This test is not yet approved or cleared by the [place] FDA  and  has been authorized for detection and/or diagnosis of SARS-CoV-2 by FDA under an Emergency Use Authorization (EUA).  This EUA will remain in effect (meaning this test can be used) for the duration of  the COVID-19 d eclaration under Section 564(b)(1) of the Act, 21 U.S.C. section 360bbb-3(b)(1), unless the authorization is terminated or revoked sooner.      Influenza A by PCR NEGATIVE NEGATIVE Final   Influenza B by PCR NEGATIVE NEGATIVE Final    Comment: (NOTE) The Xpert Xpress SARS-CoV-2/FLU/RSV assay is intended as an aid in  the diagnosis of influenza from Nasopharyngeal swab specimens and  should not be used as a sole basis for treatment. Nasal washings and  aspirates are unacceptable for Xpert Xpress SARS-CoV-2/FLU/RSV  testing.  Fact Sheet for Patients: PinkCheek.be  Fact Sheet for Healthcare Providers: GravelBags.it  This test is not yet approved or cleared by the [place] FDA and  has been authorized for detection and/or diagnosis of SARS-CoV-2 by  FDA under an Emergency Use Authorization  (EUA). This EUA will remain  in effect (meaning this test can be used) for the duration of the  Covid-19 declaration under Section 564(b)(1) of the Act, 21  U.S.C. section 360bbb-3(b)(1), unless the authorization is  terminated or revoked. Performed at [facility], [place] [address], [place], [place] [address]   Blood Culture (routine x 2)     Status: None   Collection Time: 01/11/20  6:33 AM   Specimen: BLOOD  Result Value Ref Range Status   Specimen Description BLOOD SITE NOT SPECIFIED  Final   Special Requests   Final    BOTTLES DRAWN AEROBIC AND ANAEROBIC Blood Culture adequate volume   Culture   Final    NO GROWTH 5 DAYS Performed at [facility], [address] [place], [place] [address]    Report Status 01/16/2020 FINAL  Final     Labs: BNP (last 3 results) No results for input(s): BNP in the last 8760 hours. Basic Metabolic Panel: Recent Labs  Lab 01/12/20 0406 01/12/20 0406 01/13/20 0406 01/14/20 0500 01/15/20 0415 01/16/20 0200 01/17/20 0323  NA 132*   < > 136 139 138 138 134*  K 5.5*   < > 5.0 5.3* 5.6* 5.3* 5.2*  CL 103   < > 107 108 105 103 102  CO2 18*   < > 18* 23 24 24 22   GLUCOSE 396*   < > 333* 194* 209* 155* 192*  BUN 60*   < > 56* 49* 43* 40* 38*  CREATININE 2.11*   < > 1.58* 1.42* 1.42* 1.36* 1.41*  CALCIUM 8.2*   < > 8.1* 8.4* 8.3* 8.6* 8.6*  MG 2.2  --   --   --   --   --   --    < > = values in this interval not displayed.   Liver Function Tests: Recent Labs  Lab 01/12/20 0406 01/13/20 0406 01/14/20 0500 01/15/20 0415 01/16/20 0200  AST 35 27 56* 42* 25  ALT 33 31 50* 62* 55*  ALKPHOS 58 57 56 66 67  BILITOT 0.6 0.5 0.3 0.4 0.4  PROT 6.4* 6.5 6.1* 6.3* 6.5  ALBUMIN 2.3* 2.4* 2.3* 2.4* 2.5*   No results for input(s): LIPASE, AMYLASE in the last 168 hours. No results for input(s): AMMONIA in the last 168 hours. CBC: Recent Labs  Lab 01/12/20 0406 01/13/20 0406 01/14/20 0500 01/15/20 0415 01/16/20 0200  WBC 8.4  10.2 6.9 6.5  6.7  NEUTROABS 7.4 9.3* 6.0 5.4 5.8  HGB 10.8* 10.9* 10.6* 11.1* 11.7*  HCT 32.8* 32.9* 32.7* 34.3* 35.4*  MCV 96.2 95.6 97.3 97.2 96.5  PLT 120* 160 174 176 202   Cardiac Enzymes: No results for input(s): CKTOTAL, CKMB, CKMBINDEX, TROPONINI in the last 168 hours. BNP: Invalid input(s): POCBNP CBG: Recent Labs  Lab 01/17/20 0719 01/17/20 1151 01/17/20 1558 01/17/20 2102 01/18/20 0713  GLUCAP 148* 181* 202* 162* 74   D-Dimer Recent Labs    01/16/20 0200  DDIMER 1.75*   Hgb A1c No results for input(s): HGBA1C in the last 72 hours. Lipid Profile No results for input(s): CHOL, HDL, LDLCALC, TRIG, CHOLHDL, LDLDIRECT in the last 72 hours. Thyroid function studies No results for input(s): TSH, T4TOTAL, T3FREE, THYROIDAB in the last 72 hours.  Invalid input(s): FREET3 Anemia work up No results for input(s): VITAMINB12, FOLATE, FERRITIN, TIBC, IRON, RETICCTPCT in the last 72 hours. Urinalysis    Component Value Date/Time   COLORURINE YELLOW 08/28/2017 2008   APPEARANCEUR CLEAR 08/28/2017 2008   LABSPEC 1.023 08/28/2017 2008   PHURINE 5.0 08/28/2017 2008   GLUCOSEU NEGATIVE 08/28/2017 2008   HGBUR NEGATIVE 08/28/2017 2008   [place] NEGATIVE 08/28/2017 2008   BILIRUBINUR neg 10/12/2013 [place] 08/28/2017 2008   PROTEINUR 30 (A) 08/28/2017 2008   UROBILINOGEN 0.2 10/12/2013 0829   NITRITE NEGATIVE 08/28/2017 2008   LEUKOCYTESUR NEGATIVE 08/28/2017 2008   Sepsis Labs Invalid input(s): PROCALCITONIN,  WBC,  LACTICIDVEN Microbiology Recent Results (from the past 240 hour(s))  Blood Culture (routine x 2)     Status: None   Collection Time: 01/11/20  4:45 AM   Specimen: BLOOD  Result Value Ref Range Status   Specimen Description BLOOD SITE NOT SPECIFIED  Final   Special Requests   Final    BOTTLES DRAWN AEROBIC AND ANAEROBIC Blood Culture adequate volume   Culture   Final    NO GROWTH 5 DAYS Performed at [facility],  [address], [place], [place] [address]    Report Status 01/16/2020 FINAL  Final  Respiratory Panel by RT PCR (Flu A&B, Covid) - Nasopharyngeal Swab     Status: Abnormal   Collection Time: 01/11/20  5:27 AM   Specimen: Nasopharyngeal Swab  Result Value Ref Range Status   SARS Coronavirus 2 by RT PCR POSITIVE (A) NEGATIVE Final    Comment: emailed L. Berdik RN 8:30 01/11/20 ([email or web address]) (NOTE) SARS-CoV-2 target nucleic acids are DETECTED.  SARS-CoV-2 RNA is generally detectable in upper respiratory specimens  during the acute phase of infection. Positive results are indicative of the presence of the identified virus, but do not rule out bacterial infection or co-infection with other pathogens not detected by the test. Clinical correlation with patient history and other diagnostic information is necessary to determine patient infection status. The expected result is Negative.  Fact Sheet for Patients:  PinkCheek.be  Fact Sheet for Healthcare Providers: GravelBags.it  This test is not yet approved or cleared by the [place] FDA and  has been authorized for detection and/or diagnosis of SARS-CoV-2 by FDA under an Emergency Use Authorization (EUA).  This EUA will remain in effect (meaning this test can be used) for the duration of  the COVID-19 d eclaration under Section 564(b)(1) of the Act, 21 U.S.C. section 360bbb-3(b)(1), unless the authorization is terminated or revoked sooner.      Influenza A by PCR NEGATIVE NEGATIVE Final   Influenza B by PCR NEGATIVE  NEGATIVE Final    Comment: (NOTE) The Xpert Xpress SARS-CoV-2/FLU/RSV assay is intended as an aid in  the diagnosis of influenza from Nasopharyngeal swab specimens and  should not be used as a sole basis for treatment. Nasal washings and  aspirates are unacceptable for Xpert Xpress SARS-CoV-2/FLU/RSV  testing.  Fact Sheet for  Patients: PinkCheek.be  Fact Sheet for Healthcare Providers: GravelBags.it  This test is not yet approved or cleared by the [place] FDA and  has been authorized for detection and/or diagnosis of SARS-CoV-2 by  FDA under an Emergency Use Authorization (EUA). This EUA will remain  in effect (meaning this test can be used) for the duration of the  Covid-19 declaration under Section 564(b)(1) of the Act, 21  U.S.C. section 360bbb-3(b)(1), unless the authorization is  terminated or revoked. Performed at [facility], [place] [address], [place], [place] [address]   Blood Culture (routine x 2)     Status: None   Collection Time: 01/11/20  6:33 AM   Specimen: BLOOD  Result Value Ref Range Status   Specimen Description BLOOD SITE NOT SPECIFIED  Final   Special Requests   Final    BOTTLES DRAWN AEROBIC AND ANAEROBIC Blood Culture adequate volume   Culture   Final    NO GROWTH 5 DAYS Performed at [facility], [address], [place], [place] [address]    Report Status 01/16/2020 FINAL  Final     Time coordinating discharge: Over 30 minutes  SIGNED:   Marieclaire Bettenhausen J [place] DO  [facility] 01/18/2020, 11:13 AM

## 2020-01-22 ENCOUNTER — Encounter: Payer: Self-pay | Admitting: Family Medicine

## 2020-01-22 ENCOUNTER — Telehealth (INDEPENDENT_AMBULATORY_CARE_PROVIDER_SITE_OTHER): Payer: BC Managed Care – PPO | Admitting: Family Medicine

## 2020-01-22 VITALS — BP 126/76 | HR 80 | Ht 72.0 in | Wt 232.0 lb

## 2020-01-22 DIAGNOSIS — R7401 Elevation of levels of liver transaminase levels: Secondary | ICD-10-CM

## 2020-01-22 DIAGNOSIS — E875 Hyperkalemia: Secondary | ICD-10-CM

## 2020-01-22 DIAGNOSIS — N179 Acute kidney failure, unspecified: Secondary | ICD-10-CM

## 2020-01-22 DIAGNOSIS — J069 Acute upper respiratory infection, unspecified: Secondary | ICD-10-CM

## 2020-01-22 DIAGNOSIS — U071 COVID-19: Secondary | ICD-10-CM

## 2020-01-22 DIAGNOSIS — N1831 Chronic kidney disease, stage 3a: Secondary | ICD-10-CM

## 2020-01-22 NOTE — Progress Notes (Signed)
Patient ID: Micheal Graham, male   DOB: 1956/01/01, 64 y.o.   MRN: [id number]   This visit type was conducted due to national recommendations for restrictions regarding the COVID-19 pandemic in an effort to limit this patient's exposure and mitigate transmission in our community.   Virtual Visit via Video Note  I connected with Micheal Graham on 01/22/20 at  2:30 PM EDT by a video enabled telemedicine application and verified that I am speaking with the correct person using two identifiers.  Location patient: home Location provider:work or home office Persons participating in the virtual visit: patient, provider  I discussed the limitations of evaluation and management by telemedicine and the availability of in person appointments. The patient expressed understanding and agreed to proceed.   HPI: Patient had recent admission with Covid infection.  Past medical history significant for hypertension, chronic kidney disease who presented to the ED with some shortness of breath, cough, and loss of appetite.  He had been fully vaccinated against COVID-19.  He had negative rapid Covid antigen test and negative PCR on 12/31/2019.  He was treated with prednisone and Zithromax and symptoms seem to be improving and almost resolved but then retested secondary to recurrence of symptoms on 01/08/2020 and that was positive.  He had progressive symptoms.  He was being evaluated for possible monoclonal antibody infusion but was near the end of the 10-day window for that.  When he presented to the ED oxygen sats 80s and slightly tachypneic.  Chest x-ray showed infiltrate in the left mid and lower lung fields.   Chest x-ray showed multifocal pneumonia.  Covid PCR test positive.  He completed 5-day course of remdesivir and also treated with IV steroids initially then transition to oral steroids.  Required supplemental oxygen.  Discharged on oxygen but not requiring at this point.  O2 sat is 91 to 96% off oxygen.  He had  elevated D-dimer but venous duplex lower extremities negative for DVT.  His creatinine was elevated on admission at 3.39 but improved with hydration.  He had suggestion for bacterial pneumonia with elevated pro calcitonin level and was treated with ceftriaxone and Zithromax.  Had mild transaminase elevations probably related to COVID-19 infection  Mild hyponatremia and hyperkalemia.  He has history of hypertension and had been on blood pressure medications including metoprolol and amlodipine benazepril and these were held because of some low blood pressures.  Monitoring at home and thus far stable.  Blood sugars were elevated.  He had A1c back in 2018 5.9%.  A1c of 8.3%.  Exacerbated by high-dose steroids.  Placed initially on Lantus along with NovoLog.  He was discharged on sliding scale short acting insulin.  Prednisone dose is tapering and blood sugars are gradually improving.  Feels much better overall at this time.  Less dyspnea.  No fever.  No significant cough.  Scheduled to return to work a week from tomorrow   ROS: See pertinent positives and negatives per HPI.  Past Medical History:  Diagnosis Date  . Allergy   . 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 Atypical nevus 01/16/2013   moderate-right lower back (WS)  . Atypical nevus 05/29/2013   moderate-mid back   . Basal cell carcinoma 07/15/1999   right neck (EXC)  . Basal cell carcinoma 08/29/1999   sclerosis- right neck (MOHS)  . Basal cell carcinoma 02/15/2008   right shin-(CX35FU)  . Basal cell carcinoma 08/13/2014   nod-right mid back (CX35FU), nod-right side nose (  EXC)  . Basal cell carcinoma 08/23/2014   nod-right side nose sup (MOHS)  . Basal cell carcinoma 08/12/2015   sup/nod-left ear rim (CX35FU)  . Basal cell carcinoma 01/22/2016   sup-right scapula (CX35FU)  . Basal cell carcinoma 06/09/2017   sup/nod-left post shoulder (CX35FU)  . Basal cell carcinoma 11/17/2018   sup/nod-right side of  nose (CX35FU)  . Basal cell carcinoma 01/26/2019   nod-right side nose bridge, inf  . BCC (basal cell carcinoma) 06/19/2019   below right ear-cx3 and Excision  . Cancer [facility]) 2013   right shin  . Celiac disease 01/11/2020  . Chronic kidney disease   . Cognitive impairment, mild, so stated 01/23/2014  . Colon polyps   . 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 Hypertension   . Migraines   . Squamous cell carcinoma of skin 09/07/2018   in situ-right mid shin (CX35FU)    Past Surgical History:  Procedure Laterality Date  . ABDOMINAL HERNIA REPAIR  2012  . APPENDECTOMY  1974  . left knee  2011   surgery  . VARICOSE VEIN SURGERY      Family History  Problem Relation Age of Onset  . Hypertension Mother   . Heart disease Father   . Cancer Maternal Uncle        prostate  . Stroke Paternal Grandfather     SOCIAL HX: Non-smoker   Current Outpatient Medications:  .  acetaminophen (TYLENOL) 325 MG tablet, Take 2 tablets (650 mg total) by mouth every 6 (six) hours as needed for mild pain or headache (fever >/= 101)., Disp: , Rfl:  .  blood glucose meter kit and supplies KIT, Dispense based on patient and insurance preference. Use up to four times daily as directed. (FOR ICD-9 250.00, 250.01)., Disp: 1 each, Rfl: 0 .  diphenhydramine-acetaminophen (TYLENOL PM) 25-500 MG TABS tablet, Take 1 tablet by mouth at bedtime as needed (sleep)., Disp: , Rfl:  .  fexofenadine (ALLEGRA) 180 MG tablet, Take 180 mg by mouth daily., Disp: , Rfl:  .  insulin aspart (FIASP) 100 UNIT/ML FlexTouch Pen, Inject 2-10 Units into the skin 4 (four) times daily -  before meals and at bedtime. Glucose 150-200 2 units, 201-250 4 units, 251-300 6 units, 301-350 8 units, 351-400 10 units, Disp: 15 mL, Rfl: 0 .  Insulin Pen Needle (PEN NEEDLES 3/16") 31G X 5 MM MISC, Use as directed with insulin pen, Disp: 100 each, Rfl: 0 .  predniSONE (DELTASONE) 10 MG tablet, Take 5 tablets (50 mg total) by mouth daily for 1 day, THEN 4  tablets (40 mg total) daily for 2 days, THEN 3 tablets (30 mg total) daily for 2 days, THEN 2 tablets (20 mg total) daily for 2 days, THEN 1 tablet (10 mg total) daily for 2 days., Disp: 25 tablet, Rfl: 0 .  Prenatal Multivit-Min-Fe-FA (PRENATAL VITAMINS PO), Take 1 capsule by mouth daily. , Disp: , Rfl:  .  sertraline (ZOLOFT) 100 MG tablet, Take 1 tablet (100 mg total) by mouth daily., Disp: 90 tablet, Rfl: 3 .  sildenafil (VIAGRA) 100 MG tablet, Take 0.5-1 tablets (50-100 mg total) by mouth daily as needed for erectile dysfunction., Disp: 6 tablet, Rfl: 11 .  Calcium Carb-Cholecalciferol [id number] MG-UNIT TABS, Take 1 tablet by mouth daily. (Patient not taking: Reported on 01/22/2020), Disp: , Rfl:  .  Cholecalciferol (VITAMIN D) 2000 units CAPS, Take 2,000 Units by mouth daily.  (Patient not taking: Reported on 01/22/2020), Disp: , Rfl:  .  diclofenac Sodium (VOLTAREN) 1 % GEL, Apply 4 g topically 4 (four) times daily. (Patient not taking: Reported on 01/22/2020), Disp: 100 g, Rfl: 1 .  magnesium oxide (MAG-OX) 400 MG tablet, Take 400 mg by mouth daily. (Patient not taking: Reported on 01/22/2020), Disp: , Rfl:  .  Probiotic CAPS, Take 1 capsule by mouth daily. (Patient not taking: Reported on 01/22/2020), Disp: , Rfl:  .  Testosterone 20.25 MG/ACT (1.62%) GEL, USE 2 PUMPS ON EACH ARM DAILY (Patient not taking: Reported on 01/22/2020), Disp: 150 g, Rfl: 0  EXAM:  VITALS per patient if applicable:  GENERAL: alert, oriented, appears well and in no acute distress  HEENT: atraumatic, conjunttiva clear, no obvious abnormalities on inspection of external nose and ears  NECK: normal movements of the head and neck  LUNGS: on inspection no signs of respiratory distress, breathing rate appears normal, no obvious gross SOB, gasping or wheezing  CV: no obvious cyanosis  MS: moves all visible extremities without noticeable abnormality  PSYCH/NEURO: pleasant and cooperative, no obvious depression or  anxiety, speech and thought processing grossly intact  ASSESSMENT AND PLAN:  Discussed the following assessment and plan:  #1 recent Covid pneumonia in a patient fully vaccinated with possible superimposed bacterial pneumonia.  Treated with broad-spectrum antibiotics and remdesivir and now on prednisone taper doing better.  He is currently doing well at home off oxygen.  -Follow-up immediately for any dyspnea or other concerns.  He is doing full 21-day quarantine from onset of symptoms which were around October 4th  #2 probable steroid-induced hyperglycemia in a patient with prediabetes range blood sugars. -Continue tapering of prednisone and continue sliding scale insulin for now.  Suspect he will be able to come off insulin as he comes off prednisone next week. -Plan future A1c in about 3 months  #3 hypertension currently stable off medications.  Will likely start to climb as he becomes more active  #4 recent mild hyperkalemia.  Not taking any potassium supplements -Recheck future lab with basic metabolic panel  #5 acute kidney injury on chronic kidney disease likely related to recent dehydration.  Recheck basic metabolic panel as above     I discussed the assessment and treatment plan with the patient. The patient was provided an opportunity to ask questions and all were answered. The patient agreed with the plan and demonstrated an understanding of the instructions.   The patient was advised to call back or seek an in-person evaluation if the symptoms worsen or if the condition fails to improve as anticipated.     Carolann Littler, MD

## 2020-01-25 ENCOUNTER — Telehealth: Payer: Self-pay | Admitting: Family Medicine

## 2020-01-25 NOTE — Telephone Encounter (Signed)
[employer] Coronavirus Short Term Disability form to be filled out- placed in dr's folder.  Fax to: [phone]  **Form is urgent, insurance at work was cancelled**

## 2020-01-25 NOTE — Telephone Encounter (Signed)
Form placed in PCP red folder at nurse station

## 2020-01-26 ENCOUNTER — Encounter: Payer: Self-pay | Admitting: Family Medicine

## 2020-01-26 NOTE — Telephone Encounter (Signed)
Will try to get done today before I leave.

## 2020-01-31 ENCOUNTER — Telehealth: Payer: Self-pay | Admitting: Family Medicine

## 2020-01-31 NOTE — Telephone Encounter (Signed)
The patient's wife dropped off FMLA forms  Fax completed forms to [phone]  Disposition: Dr's Folder

## 2020-02-02 NOTE — Telephone Encounter (Signed)
FYI Pt form has been received and placed on Dr Elease Hashimoto desk for completing

## 2020-02-05 ENCOUNTER — Other Ambulatory Visit (INDEPENDENT_AMBULATORY_CARE_PROVIDER_SITE_OTHER): Payer: BC Managed Care – PPO

## 2020-02-05 ENCOUNTER — Other Ambulatory Visit: Payer: Self-pay

## 2020-02-05 DIAGNOSIS — R7401 Elevation of levels of liver transaminase levels: Secondary | ICD-10-CM | POA: Diagnosis not present

## 2020-02-05 DIAGNOSIS — N1831 Chronic kidney disease, stage 3a: Secondary | ICD-10-CM

## 2020-02-05 DIAGNOSIS — N179 Acute kidney failure, unspecified: Secondary | ICD-10-CM

## 2020-02-05 DIAGNOSIS — J069 Acute upper respiratory infection, unspecified: Secondary | ICD-10-CM

## 2020-02-05 DIAGNOSIS — E875 Hyperkalemia: Secondary | ICD-10-CM

## 2020-02-06 LAB — CBC WITH DIFFERENTIAL/PLATELET
Absolute Monocytes: 371 cells/uL (ref 200–950)
Basophils Absolute: 29 cells/uL (ref 0–200)
Basophils Relative: 0.5 %
Eosinophils Absolute: 171 cells/uL (ref 15–500)
Eosinophils Relative: 3 %
HCT: 37.1 % — ABNORMAL LOW (ref 38.5–50.0)
Hemoglobin: 12.8 g/dL — ABNORMAL LOW (ref 13.2–17.1)
Lymphs Abs: 1767 cells/uL (ref 850–3900)
MCH: 32.7 pg (ref 27.0–33.0)
MCHC: 34.5 g/dL (ref 32.0–36.0)
MCV: 94.6 fL (ref 80.0–100.0)
MPV: 12.4 fL (ref 7.5–12.5)
Monocytes Relative: 6.5 %
Neutro Abs: 3363 cells/uL (ref 1500–7800)
Neutrophils Relative %: 59 %
Platelets: 129 10*3/uL — ABNORMAL LOW (ref 140–400)
RBC: 3.92 10*6/uL — ABNORMAL LOW (ref 4.20–5.80)
RDW: 15.3 % — ABNORMAL HIGH (ref 11.0–15.0)
Total Lymphocyte: 31 %
WBC: 5.7 10*3/uL (ref 3.8–10.8)

## 2020-02-06 LAB — BASIC METABOLIC PANEL
BUN/Creatinine Ratio: 21 (calc) (ref 6–22)
BUN: 35 mg/dL — ABNORMAL HIGH (ref 7–25)
CO2: 26 mmol/L (ref 20–32)
Calcium: 9.4 mg/dL (ref 8.6–10.3)
Chloride: 105 mmol/L (ref 98–110)
Creat: 1.7 mg/dL — ABNORMAL HIGH (ref 0.70–1.25)
Glucose, Bld: 124 mg/dL — ABNORMAL HIGH (ref 65–99)
Potassium: 4.7 mmol/L (ref 3.5–5.3)
Sodium: 139 mmol/L (ref 135–146)

## 2020-02-06 LAB — HEPATIC FUNCTION PANEL
AG Ratio: 1.3 (calc) (ref 1.0–2.5)
ALT: 30 U/L (ref 9–46)
AST: 18 U/L (ref 10–35)
Albumin: 3.9 g/dL (ref 3.6–5.1)
Alkaline phosphatase (APISO): 73 U/L (ref 35–144)
Bilirubin, Direct: 0.1 mg/dL (ref 0.0–0.2)
Globulin: 2.9 g/dL (calc) (ref 1.9–3.7)
Indirect Bilirubin: 0.4 mg/dL (calc) (ref 0.2–1.2)
Total Bilirubin: 0.5 mg/dL (ref 0.2–1.2)
Total Protein: 6.8 g/dL (ref 6.1–8.1)

## 2020-02-07 ENCOUNTER — Encounter: Payer: Self-pay | Admitting: Family Medicine

## 2020-02-12 ENCOUNTER — Encounter: Payer: Self-pay | Admitting: Family Medicine

## 2020-02-12 ENCOUNTER — Ambulatory Visit: Payer: BC Managed Care – PPO | Admitting: Family Medicine

## 2020-02-12 ENCOUNTER — Other Ambulatory Visit: Payer: Self-pay

## 2020-02-12 VITALS — BP 118/60 | HR 67 | Temp 97.9°F | Ht 72.0 in | Wt 218.5 lb

## 2020-02-12 DIAGNOSIS — R739 Hyperglycemia, unspecified: Secondary | ICD-10-CM | POA: Diagnosis not present

## 2020-02-12 DIAGNOSIS — I10 Essential (primary) hypertension: Secondary | ICD-10-CM

## 2020-02-12 DIAGNOSIS — U071 COVID-19: Secondary | ICD-10-CM | POA: Diagnosis not present

## 2020-02-12 NOTE — Patient Instructions (Signed)
Keep down high glycemic foods.    Monitor blood pressure and be in touch if consistently > 140/90.  Let's plan on 3 month follow up and will check A1C then.

## 2020-02-12 NOTE — Progress Notes (Signed)
Established Patient Office Visit  Subjective:  Patient ID: Micheal Graham, male    DOB: [birth year]  Age: 64 y.o. MRN: [id number]  CC:  Chief Complaint  Patient presents with  . Follow-up    discuss lab results , having reading, o2 sTAT and b/p , pulse     HPI RASHEE MARSCHALL presents for follow-up from recent Covid infection.  He has had his vaccines.  He developed some hypoxemia and ended up being admitted as per previous notes.  Did not get monoclonal antibodies as he was over 10 days at the time he went to the hospital and was hypoxic.  Did receive remdesivir.  He feels that he is well on his way to recovery at this time.  He plans to start back to work tomorrow.  His energy levels have improved some.  Appetite improved.  He had hypertension and was on combination therapy prior to his admission.  He has lost some weight and thus far has maintained the weight loss.  He was on prednisone which did run up his blood sugars.  He has been monitoring and been off prednisone for some time and fasting blood sugars range 85-110.  No polyuria or polydipsia.  He is does have some mild weakness when he stands up initially but denies any severe dizziness.  No syncope.  No chest pains.  No fever.  No cough.  No dyspnea.  Recent labs which have been reviewed.  Liver enzymes are back to normal.  Renal function was off slightly.  His hemoglobin improved from 11.7-12.8.  Has been taking benazepril previously but is off that.  Past Medical History:  Diagnosis Date  . Allergy   . 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 Atypical nevus 01/16/2013   moderate-right lower back (WS)  . Atypical nevus 05/29/2013   moderate-mid back   . Basal cell carcinoma 07/15/1999   right neck (EXC)  . Basal cell carcinoma 08/29/1999   sclerosis- right neck (MOHS)  . Basal cell carcinoma 02/15/2008   right shin-(CX35FU)  . Basal cell carcinoma 08/13/2014   nod-right mid back (CX35FU), nod-right  side nose (EXC)  . Basal cell carcinoma 08/23/2014   nod-right side nose sup (MOHS)  . Basal cell carcinoma 08/12/2015   sup/nod-left ear rim (CX35FU)  . Basal cell carcinoma 01/22/2016   sup-right scapula (CX35FU)  . Basal cell carcinoma 06/09/2017   sup/nod-left post shoulder (CX35FU)  . Basal cell carcinoma 11/17/2018   sup/nod-right side of nose (CX35FU)  . Basal cell carcinoma 01/26/2019   nod-right side nose bridge, inf  . BCC (basal cell carcinoma) 06/19/2019   below right ear-cx3 and Excision  . Cancer [facility]) 2013   right shin  . Celiac disease 01/11/2020  . Chronic kidney disease   . Cognitive impairment, mild, so stated 01/23/2014  . Colon polyps   . 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 Hypertension   . Migraines   . Squamous cell carcinoma of skin 09/07/2018   in situ-right mid shin (CX35FU)    Past Surgical History:  Procedure Laterality Date  . ABDOMINAL HERNIA REPAIR  2012  . APPENDECTOMY  1974  . left knee  2011   surgery  . VARICOSE VEIN SURGERY      Family History  Problem Relation Age of Onset  . Hypertension Mother   . Heart disease Father   . Cancer Maternal Uncle        prostate  . Stroke Paternal  Grandfather     Social History   Socioeconomic History  . Marital status: Married    Spouse name: Not on file  . Number of children: Not on file  . Years of education: Not on file  . Highest education level: Not on file  Occupational History  . Not on file  Tobacco Use  . Smoking status: Former Smoker    Packs/day: 2.00    Years: 30.00    Pack years: 60.00    Types: Cigarettes    Quit date: 09/09/1999    Years since quitting: 20.4  . Smokeless tobacco: Never Used  Vaping Use  . Vaping Use: Never used  Substance and Sexual Activity  . Alcohol use: No    Alcohol/week: 0.0 standard drinks  . Drug use: No  . Sexual activity: Not on file  Other Topics Concern  . Not on file  Social History Narrative   Right handed, caffeine 2 cups daily, FT  -truck driver, Married, 2 kids, HS graduate   Social Determinants of Health   Financial Resource Strain:   . Difficulty of Paying Living Expenses: Not on file  Food Insecurity:   . Worried About Charity fundraiser in the Last Year: Not on file  . Ran Out of Food in the Last Year: Not on file  Transportation Needs:   . Lack of Transportation (Medical): Not on file  . Lack of Transportation (Non-Medical): Not on file  Physical Activity:   . Days of Exercise per Week: Not on file  . Minutes of Exercise per Session: Not on file  Stress:   . Feeling of Stress : Not on file  Social Connections:   . Frequency of Communication with Friends and Family: Not on file  . Frequency of Social Gatherings with Friends and Family: Not on file  . Attends Religious Services: Not on file  . Active Member of Clubs or Organizations: Not on file  . Attends Archivist Meetings: Not on file  . Marital Status: Not on file  Intimate Partner Violence:   . Fear of Current or Ex-Partner: Not on file  . Emotionally Abused: Not on file  . Physically Abused: Not on file  . Sexually Abused: Not on file    Outpatient Medications Prior to Visit  Medication Sig Dispense Refill  . acetaminophen (TYLENOL) 325 MG tablet Take 2 tablets (650 mg total) by mouth every 6 (six) hours as needed for mild pain or headache (fever >/= 101).    . blood glucose meter kit and supplies KIT Dispense based on patient and insurance preference. Use up to four times daily as directed. (FOR ICD-9 250.00, 250.01). 1 each 0  . Calcium Carb-Cholecalciferol [id number] MG-UNIT TABS Take 1 tablet by mouth daily.     . Cholecalciferol (VITAMIN D) 2000 units CAPS Take 2,000 Units by mouth daily.     . diphenhydramine-acetaminophen (TYLENOL PM) 25-500 MG TABS tablet Take 1 tablet by mouth at bedtime as needed (sleep).    . fexofenadine (ALLEGRA) 180 MG tablet Take 180 mg by mouth daily.    . Prenatal Multivit-Min-Fe-FA (PRENATAL VITAMINS  PO) Take 1 capsule by mouth daily.     . Probiotic CAPS Take 1 capsule by mouth daily.     . sertraline (ZOLOFT) 100 MG tablet Take 1 tablet (100 mg total) by mouth daily. 90 tablet 3  . sildenafil (VIAGRA) 100 MG tablet Take 0.5-1 tablets (50-100 mg total) by mouth daily as needed for erectile dysfunction.  6 tablet 11  . Testosterone 20.25 MG/ACT (1.62%) GEL USE 2 PUMPS ON EACH ARM DAILY 150 g 0  . diclofenac Sodium (VOLTAREN) 1 % GEL Apply 4 g topically 4 (four) times daily. (Patient not taking: Reported on 01/22/2020) 100 g 1  . insulin aspart (FIASP) 100 UNIT/ML FlexTouch Pen Inject 2-10 Units into the skin 4 (four) times daily -  before meals and at bedtime. Glucose 150-200 2 units, 201-250 4 units, 251-300 6 units, 301-350 8 units, 351-400 10 units (Patient not taking: Reported on 02/12/2020) 15 mL 0  . Insulin Pen Needle (PEN NEEDLES 3/16") 31G X 5 MM MISC Use as directed with insulin pen (Patient not taking: Reported on 02/12/2020) 100 each 0  . magnesium oxide (MAG-OX) 400 MG tablet Take 400 mg by mouth daily. (Patient not taking: Reported on 01/22/2020)     No facility-administered medications prior to visit.    Allergies  Allergen Reactions  . Gluten Meal Diarrhea    ROS Review of Systems  Constitutional: Negative for chills and fever.  Respiratory: Negative for cough and shortness of breath.   Cardiovascular: Negative for chest pain.  Endocrine: Negative for polydipsia and polyuria.  Genitourinary: Negative for dysuria.      Objective:    Physical Exam Vitals reviewed.  Constitutional:      Appearance: Normal appearance.  Cardiovascular:     Rate and Rhythm: Normal rate and regular rhythm.  Pulmonary:     Effort: Pulmonary effort is normal.     Breath sounds: Normal breath sounds. No wheezing or rales.  Neurological:     General: No focal deficit present.     Mental Status: He is alert.     BP 118/60 (BP Location: Left Arm, Patient Position: Sitting, Cuff Size:  Normal)   Pulse 67   Temp 97.9 F (36.6 C) (Oral)   Ht 6' (1.829 m)   Wt 218 lb 8 oz (99.1 kg)   SpO2 97%   BMI 29.63 kg/m  Wt Readings from Last 3 Encounters:  02/12/20 218 lb 8 oz (99.1 kg)  01/22/20 232 lb (105.2 kg)  01/14/20 232 lb 2.3 oz (105.3 kg)     Health Maintenance Due  Topic Date Due  . Hepatitis C Screening  Never done  . COVID-19 Vaccine (1) Never done  . PNA vac Low Risk Adult (2 of 2 - PCV13) 01/13/2020    There are no preventive care reminders to display for this patient.  Lab Results  Component Value Date   TSH 1.79 11/12/2016   Lab Results  Component Value Date   WBC 5.7 02/05/2020   HGB 12.8 (L) 02/05/2020   HCT 37.1 (L) 02/05/2020   MCV 94.6 02/05/2020   PLT 129 (L) 02/05/2020   Lab Results  Component Value Date   NA 139 02/05/2020   K 4.7 02/05/2020   CO2 26 02/05/2020   GLUCOSE 124 (H) 02/05/2020   BUN 35 (H) 02/05/2020   CREATININE 1.70 (H) 02/05/2020   BILITOT 0.5 02/05/2020   ALKPHOS 67 01/16/2020   AST 18 02/05/2020   ALT 30 02/05/2020   PROT 6.8 02/05/2020   ALBUMIN 2.5 (L) 01/16/2020   CALCIUM 9.4 02/05/2020   ANIONGAP 10 01/17/2020   GFR 48.48 (L) 11/07/2018   Lab Results  Component Value Date   CHOL 199 06/12/2015   Lab Results  Component Value Date   HDL 34.10 (L) 06/12/2015   No results found for: [facility] Lab Results  Component Value Date  TRIG 297 (H) 01/11/2020   Lab Results  Component Value Date   CHOLHDL 6 06/12/2015   Lab Results  Component Value Date   HGBA1C 8.3 (H) 01/11/2020      Assessment & Plan:   #1 recent Covid infection clinically improved -Multiple forms have been completed for him for work absence.  He plans to return to work 02/13/2020  #2 hyperglycemia related to recent prednisone.  Blood sugars improving now that he is off prednisone  -Recommend recheck A1c in 3 months after he has been off the prednisone  #3 history of hypertension.  Currently stable off medication -Continue  to monitor blood pressure and be in touch if consistently greater than 140/90  No orders of the defined types were placed in this encounter.   Follow-up: No follow-ups on file.    Carolann Littler, MD

## 2020-04-03 ENCOUNTER — Encounter: Payer: Self-pay | Admitting: Podiatry

## 2020-04-04 NOTE — Telephone Encounter (Signed)
Hi Keely, I'm getting  a few of these. Not sure what was ordered 12/28 but can they be deleted/retracted? I'm assuming someone put in xray orders but then we cancelled that day and the orders were still put through. Thanks, Dr. Amalia Hailey

## 2020-04-23 ENCOUNTER — Encounter: Payer: Self-pay | Admitting: Family Medicine

## 2020-04-24 ENCOUNTER — Other Ambulatory Visit: Payer: Self-pay

## 2020-04-24 MED ORDER — SERTRALINE HCL 100 MG PO TABS
100.0000 mg | ORAL_TABLET | Freq: Every day | ORAL | 2 refills | Status: DC
Start: 2020-04-24 — End: 2021-04-21

## 2020-04-29 ENCOUNTER — Ambulatory Visit (INDEPENDENT_AMBULATORY_CARE_PROVIDER_SITE_OTHER): Payer: BC Managed Care – PPO | Admitting: Dermatology

## 2020-04-29 ENCOUNTER — Other Ambulatory Visit: Payer: Self-pay

## 2020-04-29 ENCOUNTER — Encounter: Payer: Self-pay | Admitting: Dermatology

## 2020-04-29 DIAGNOSIS — Z86018 Personal history of other benign neoplasm: Secondary | ICD-10-CM

## 2020-04-29 DIAGNOSIS — L918 Other hypertrophic disorders of the skin: Secondary | ICD-10-CM | POA: Diagnosis not present

## 2020-04-29 DIAGNOSIS — Z85828 Personal history of other malignant neoplasm of skin: Secondary | ICD-10-CM

## 2020-04-29 DIAGNOSIS — Z87898 Personal history of other specified conditions: Secondary | ICD-10-CM

## 2020-04-29 DIAGNOSIS — Z1283 Encounter for screening for malignant neoplasm of skin: Secondary | ICD-10-CM

## 2020-04-29 DIAGNOSIS — Z8589 Personal history of malignant neoplasm of other organs and systems: Secondary | ICD-10-CM

## 2020-04-29 DIAGNOSIS — L820 Inflamed seborrheic keratosis: Secondary | ICD-10-CM

## 2020-05-04 ENCOUNTER — Other Ambulatory Visit: Payer: Self-pay | Admitting: Family Medicine

## 2020-05-06 NOTE — Telephone Encounter (Signed)
Left message for patient to call back regarding medication request.

## 2020-05-06 NOTE — Telephone Encounter (Signed)
Looks like patient was told to stop this at hospital discharge but now needs refill.  Should he be taking it?

## 2020-05-06 NOTE — Telephone Encounter (Signed)
If he has been taking this at home would go ahead and refill for 1 year.  Suspect his blood pressure drop during acute illness but is probably back up at this point

## 2020-05-06 NOTE — Telephone Encounter (Signed)
Spoke with patient and he confirms he does not need medication.

## 2020-06-03 ENCOUNTER — Other Ambulatory Visit: Payer: Self-pay

## 2020-06-03 ENCOUNTER — Ambulatory Visit: Payer: BC Managed Care – PPO | Admitting: Family Medicine

## 2020-06-03 ENCOUNTER — Encounter: Payer: Self-pay | Admitting: Family Medicine

## 2020-06-03 VITALS — BP 118/78 | HR 64 | Ht 72.0 in | Wt 197.0 lb

## 2020-06-03 DIAGNOSIS — R739 Hyperglycemia, unspecified: Secondary | ICD-10-CM

## 2020-06-03 DIAGNOSIS — K900 Celiac disease: Secondary | ICD-10-CM | POA: Diagnosis not present

## 2020-06-03 DIAGNOSIS — Z23 Encounter for immunization: Secondary | ICD-10-CM | POA: Diagnosis not present

## 2020-06-03 DIAGNOSIS — M25569 Pain in unspecified knee: Secondary | ICD-10-CM | POA: Insufficient documentation

## 2020-06-03 DIAGNOSIS — R7989 Other specified abnormal findings of blood chemistry: Secondary | ICD-10-CM | POA: Diagnosis not present

## 2020-06-03 LAB — HEMOGLOBIN A1C: Hgb A1c MFr Bld: 5.5 % (ref 4.6–6.5)

## 2020-06-03 LAB — CBC WITH DIFFERENTIAL/PLATELET
Basophils Absolute: 0.1 10*3/uL (ref 0.0–0.1)
Basophils Relative: 0.6 % (ref 0.0–3.0)
Eosinophils Absolute: 0.1 10*3/uL (ref 0.0–0.7)
Eosinophils Relative: 1.1 % (ref 0.0–5.0)
HCT: 42.9 % (ref 39.0–52.0)
Hemoglobin: 14.5 g/dL (ref 13.0–17.0)
Lymphocytes Relative: 13.9 % (ref 12.0–46.0)
Lymphs Abs: 1.1 10*3/uL (ref 0.7–4.0)
MCHC: 33.9 g/dL (ref 30.0–36.0)
MCV: 85.9 fl (ref 78.0–100.0)
Monocytes Absolute: 0.5 10*3/uL (ref 0.1–1.0)
Monocytes Relative: 5.6 % (ref 3.0–12.0)
Neutro Abs: 6.4 10*3/uL (ref 1.4–7.7)
Neutrophils Relative %: 78.8 % — ABNORMAL HIGH (ref 43.0–77.0)
Platelets: 151 10*3/uL (ref 150.0–400.0)
RBC: 5 Mil/uL (ref 4.22–5.81)
RDW: 16.1 % — ABNORMAL HIGH (ref 11.5–15.5)
WBC: 8.1 10*3/uL (ref 4.0–10.5)

## 2020-06-03 LAB — PSA: PSA: 0.68 ng/mL (ref 0.10–4.00)

## 2020-06-03 LAB — TESTOSTERONE: Testosterone: 340.7 ng/dL (ref 300.00–890.00)

## 2020-06-03 NOTE — Patient Instructions (Signed)
[facility] (11th ed., pp. [phone]). [place], [place]: Elsevier.">  Testosterone Replacement Therapy Testosterone replacement therapy (TRT) treats men who have a low testosterone level. Testosterone is a male hormone that is produced in the testicles. It is responsible for typical male characteristics and for maintaining a man's sex drive and ability to get an erection. Testosterone also supports bone and muscle health. Low testosterone may not need to be treated. Your health care provider may recommend TRT if you have symptoms such as a low sex drive or erection problems, weak muscles or bones, or low energy. Types of TRT You and your health care provider will decide which form is best for you. TRT is available in the following forms:  Topical gels, creams, lotions, or sprays. Do not let other people, especially women or children, come in contact with the skin where the testosterone was applied.  Nasal gels.  Patches.  Pills.  Injections into the muscle or under the skin.  Long-acting pellets inserted under the skin. The amount of TRT you take and how long you take it is based on your condition. It is important to:  Begin TRT with the lowest possible dosage.  Stop TRT if your health care provider tells you to stop.  Work with your health care provider so that you feel informed and comfortable with your decision.   Tell a health care provider about:  Any allergies you have.  Any personal or family history of blood clots, breast cancer, or prostate cancer.  If you have sleep apnea or have been told that you snore.  All medicines you are taking, including vitamins, herbs, eye drops, creams, and over-the-counter medicines.  Any surgeries you have had.  Any other medical conditions you have.  If you wish to have biological children someday. What are the benefits? Benefits of TRT vary but may include improved sexual function, muscle mass or strength, mood, or quality of  life. What are the risks? Risks of TRT vary depending on your individual and medical history. Side effects can be related to the type of TRT you choose. If you choose products that are used on the skin, you may have skin irritation. If you get injections, you may have redness and swelling at the injection site. Side effects that can happen with any form of TRT include:  Lower sperm count.  Acne.  Swelling of your legs or feet, or tenderness in the chest or breast area.  Sleep disturbances and mood swings.  Enlarged prostate.  Increase in your red blood count. It is unclear if testosterone increases the risk of some serious conditions. Talk with your health care provider about your risk for these conditions, including:  Blood clots.  Heart disease, such as stroke or heart attack.  Prostate cancer. Risks of TRT may increase if you:  Have had or are at risk for prostate cancer or breast cancer.  Have had a previous stroke or heart attack.  Have a high number of red blood cells.  Have treated or untreated sleep apnea.  Have a large prostate. The long-term safety of TRT is not known. Follow these instructions at home:  Take over-the-counter and prescription medicines only as told by your health care provider.  Lose weight if you are overweight. Ask your health care provider to help you start a healthy diet and exercise program to reach and maintain a healthy weight.  Work with your health care provider to manage other medical conditions that may lower your testosterone. These include  obesity, high blood pressure, high cholesterol, diabetes, liver disease, kidney disease, and sleep apnea.  Do not use any testosterone replacement therapies that are not prescribed by your health care provider.  Keep all follow-up visits. This is important. Where to find more information  [place]: www.urologyhealth.org  Endocrine Society: www.hormone.org Contact a  health care provider if:  You have side effects from your TRT.  You have pain or swelling in your legs.  You have problems urinating.  You have lumps or changes in your breasts or armpits. Get help right away if:  You have shortness of breath.  You have chest pain.  You have slurred speech.  You have weakness or numbness in any part of your arms or legs. These symptoms may represent a serious problem that is an emergency. Do not wait to see if the symptoms will go away. Get medical help right away. Call your local emergency services (911 in the [place] Do not drive yourself to the hospital. Summary  Testosterone replacement therapy (TRT) is used to treat men who have a low testosterone level.  TRT should only be prescribed by and under the supervision of a health care provider.  Tell a health care provider about any medical conditions you have.  TRT may have side effects.  Talk with your health care provider about all of the risks and benefits before you start therapy. This information is not intended to replace advice given to you by your health care provider. Make sure you discuss any questions you have with your health care provider. Document Revised: 01/16/2020 Document Reviewed: 01/16/2020 Elsevier Patient Education  2021 [employer].

## 2020-06-03 NOTE — Progress Notes (Signed)
Established Patient Office Visit  Subjective:  Patient ID: Micheal Graham, male    DOB: 1956/02/11  Age: 65 y.o. MRN: [id number]  CC:  Chief Complaint  Patient presents with  . Fatigue    Want to check testosterone levels    HPI Micheal Graham presents for request for some follow-up labs.  He has history of hypertension, celiac disease, kidney stones, chronic kidney disease, low testosterone.  He has been scheduled to see celiac [facility] per [facility].  Plans to get follow-up colonoscopy there.  He had COVID infection back in the fall and required admission.  He had significant hyperglycemia at that time with blood sugars over 600 on high-dose prednisone.  His A1c was 8.3%.  Prior to that had been in the 5 range.  Currently blood sugars been stable off medication completely.  He does have history of low testosterone and has been on topical replacement.  Needs follow-up labs.  He is also requesting follow-up tissue transglutaminase levels.  He follows a gluten-free diet diligently.  Has had hypertension previously.  He lost substantial amount of weight with his Covid pneumonia admission and basically has been stable since coming off blood pressure medications several months ago and still stable by home readings.   Past Medical History:  Diagnosis Date  . Allergy   . 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 Atypical nevus 01/16/2013   moderate-right lower back (WS)  . Atypical nevus 05/29/2013   moderate-mid back   . Basal cell carcinoma 07/15/1999   right neck (EXC)  . Basal cell carcinoma 08/29/1999   sclerosis- right neck (MOHS)  . Basal cell carcinoma 02/15/2008   right shin-(CX35FU)  . Basal cell carcinoma 08/13/2014   nod-right mid back (CX35FU), nod-right side nose (EXC)  . Basal cell carcinoma 08/23/2014   nod-right side nose sup (MOHS)  . Basal cell carcinoma 08/12/2015   sup/nod-left ear rim (CX35FU)  . Basal cell carcinoma 01/22/2016    sup-right scapula (CX35FU)  . Basal cell carcinoma 06/09/2017   sup/nod-left post shoulder (CX35FU)  . Basal cell carcinoma 11/17/2018   sup/nod-right side of nose (CX35FU)  . Basal cell carcinoma 01/26/2019   nod-right side nose bridge, inf  . BCC (basal cell carcinoma) 06/19/2019   below right ear-cx3 and Excision  . Cancer [facility]) 2013   right shin  . Celiac disease 01/11/2020  . Chronic kidney disease   . Cognitive impairment, mild, so stated 01/23/2014  . Colon polyps   . 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 Hypertension   . Migraines   . Squamous cell carcinoma of skin 09/07/2018   in situ-right mid shin (CX35FU)    Past Surgical History:  Procedure Laterality Date  . ABDOMINAL HERNIA REPAIR  2012  . APPENDECTOMY  1974  . left knee  2011   surgery  . VARICOSE VEIN SURGERY      Family History  Problem Relation Age of Onset  . Hypertension Mother   . Heart disease Father   . Cancer Maternal Uncle        prostate  . Stroke Paternal Grandfather     Social History   Socioeconomic History  . Marital status: Married    Spouse name: Not on file  . Number of children: Not on file  . Years of education: Not on file  . Highest education level: Not on file  Occupational History  . Not on file  Tobacco Use  . Smoking  status: Former Smoker    Packs/day: 2.00    Years: 30.00    Pack years: 60.00    Types: Cigarettes    Quit date: 09/09/1999    Years since quitting: 20.7  . Smokeless tobacco: Never Used  Vaping Use  . Vaping Use: Never used  Substance and Sexual Activity  . Alcohol use: No    Alcohol/week: 0.0 standard drinks  . Drug use: No  . Sexual activity: Not on file  Other Topics Concern  . Not on file  Social History Narrative   Right handed, caffeine 2 cups daily, FT -truck driver, Married, 2 kids, HS graduate   Social Determinants of Health   Financial Resource Strain: Not on file  Food Insecurity: Not on file  Transportation Needs: Not on file   Physical Activity: Not on file  Stress: Not on file  Social Connections: Not on file  Intimate Partner Violence: Not on file    Outpatient Medications Prior to Visit  Medication Sig Dispense Refill  . ACCU-CHEK GUIDE test strip     . Accu-Chek Softclix Lancets lancets 1 each 3 (three) times daily.    Marland Kitchen acetaminophen (TYLENOL) 325 MG tablet Take 2 tablets (650 mg total) by mouth every 6 (six) hours as needed for mild pain or headache (fever >/= 101).    . blood glucose meter kit and supplies KIT Dispense based on patient and insurance preference. Use up to four times daily as directed. (FOR ICD-9 250.00, 250.01). 1 each 0  . Calcium Carb-Cholecalciferol [phone] MG-UNIT TABS Take 1 tablet by mouth daily.     . Cholecalciferol (VITAMIN D) 2000 units CAPS Take 2,000 Units by mouth daily.     . diclofenac Sodium (VOLTAREN) 1 % GEL Apply 4 g topically 4 (four) times daily. 100 g 1  . diphenhydramine-acetaminophen (TYLENOL PM) 25-500 MG TABS tablet Take 1 tablet by mouth at bedtime as needed (sleep).    . fexofenadine (ALLEGRA) 180 MG tablet Take 180 mg by mouth daily.    . Insulin Pen Needle (PEN NEEDLES 3/16") 31G X 5 MM MISC Use as directed with insulin pen 100 each 0  . magnesium oxide (MAG-OX) 400 MG tablet Take 400 mg by mouth daily.    . Prenatal Multivit-Min-Fe-FA (PRENATAL VITAMINS PO) Take 1 capsule by mouth daily.     . Probiotic CAPS Take 1 capsule by mouth daily.     . sertraline (ZOLOFT) 100 MG tablet Take 1 tablet (100 mg total) by mouth daily. 90 tablet 2  . sildenafil (VIAGRA) 100 MG tablet Take 0.5-1 tablets (50-100 mg total) by mouth daily as needed for erectile dysfunction. 6 tablet 11  . Testosterone 20.25 MG/ACT (1.62%) GEL USE 2 PUMPS ON EACH ARM DAILY 150 g 0  . amLODipine-benazepril (LOTREL) 5-20 MG capsule Take 1 capsule by mouth daily.    . metoprolol succinate (TOPROL-XL) 50 MG 24 hr tablet Take 50 mg by mouth daily.     No facility-administered medications prior to  visit.    Allergies  Allergen Reactions  . Gluten Meal Diarrhea    ROS Review of Systems  Constitutional: Negative for fatigue and unexpected weight change.  Eyes: Negative for visual disturbance.  Respiratory: Negative for cough, chest tightness and shortness of breath.   Cardiovascular: Negative for chest pain, palpitations and leg swelling.  Endocrine: Negative for polydipsia and polyuria.  Neurological: Negative for dizziness, syncope, weakness, light-headedness and headaches.      Objective:    Physical Exam  Constitutional:      Appearance: He is well-developed and well-nourished.  HENT:     Right Ear: External ear normal.     Left Ear: External ear normal.     Mouth/Throat:     Mouth: Oropharynx is clear and moist.  Eyes:     Pupils: Pupils are equal, round, and reactive to light.  Neck:     Thyroid: No thyromegaly.  Cardiovascular:     Rate and Rhythm: Normal rate and regular rhythm.  Pulmonary:     Effort: Pulmonary effort is normal. No respiratory distress.     Breath sounds: Normal breath sounds. No wheezing or rales.  Musculoskeletal:        General: No edema.     Cervical back: Neck supple.     Right lower leg: No edema.     Left lower leg: No edema.  Neurological:     Mental Status: He is alert and oriented to person, place, and time.     BP 118/78   Pulse 64   Ht 6' (1.829 m)   Wt 197 lb (89.4 kg)   SpO2 96%   BMI 26.72 kg/m  Wt Readings from Last 3 Encounters:  06/03/20 197 lb (89.4 kg)  02/12/20 218 lb 8 oz (99.1 kg)  01/22/20 232 lb (105.2 kg)     Health Maintenance Due  Topic Date Due  . Hepatitis C Screening  Never done    There are no preventive care reminders to display for this patient.  Lab Results  Component Value Date   TSH 1.79 11/12/2016   Lab Results  Component Value Date   WBC 5.7 02/05/2020   HGB 12.8 (L) 02/05/2020   HCT 37.1 (L) 02/05/2020   MCV 94.6 02/05/2020   PLT 129 (L) 02/05/2020   Lab Results   Component Value Date   NA 139 02/05/2020   K 4.7 02/05/2020   CO2 26 02/05/2020   GLUCOSE 124 (H) 02/05/2020   BUN 35 (H) 02/05/2020   CREATININE 1.70 (H) 02/05/2020   BILITOT 0.5 02/05/2020   ALKPHOS 67 01/16/2020   AST 18 02/05/2020   ALT 30 02/05/2020   PROT 6.8 02/05/2020   ALBUMIN 2.5 (L) 01/16/2020   CALCIUM 9.4 02/05/2020   ANIONGAP 10 01/17/2020   GFR 48.48 (L) 11/07/2018   Lab Results  Component Value Date   CHOL 199 06/12/2015   Lab Results  Component Value Date   HDL 34.10 (L) 06/12/2015   No results found for: [facility] Lab Results  Component Value Date   TRIG 297 (H) 01/11/2020   Lab Results  Component Value Date   CHOLHDL 6 06/12/2015   Lab Results  Component Value Date   HGBA1C 8.3 (H) 01/11/2020      Assessment & Plan:   Problem List Items Addressed This Visit      Unprioritized   Celiac disease   Relevant Orders   Tissue Transglutaminase Abs,IgG,IgA   Low testosterone - Primary   Relevant Orders   CBC with Differential/Platelet   PSA   Testosterone    Other Visit Diagnoses    Hyperglycemia       Relevant Orders   Hemoglobin A1c   Need for vaccination       Relevant Orders   Pneumococcal conjugate vaccine 13-valent IM (Completed)    -Prevnar 13 given.  Patient and I had prior Pneumovax -Check labs above.  Include A1c with prior history of steroid-induced hyperglycemia. -Do need to check CBC and  PSA with his testosterone therapy. -Continue to monitor blood pressure and be in touch if consistent blood pressure readings greater than 140/90  No orders of the defined types were placed in this encounter.   Follow-up: No follow-ups on file.    Carolann Littler, MD

## 2020-06-04 LAB — TISSUE TRANSGLUTAMINASE ABS,IGG,IGA
(tTG) Ab, IgA: <1 U/mL
(tTG) Ab, IgG: 1 U/mL

## 2020-06-07 ENCOUNTER — Ambulatory Visit: Payer: BC Managed Care – PPO | Admitting: Family Medicine

## 2020-07-01 ENCOUNTER — Other Ambulatory Visit: Payer: Self-pay

## 2020-07-01 ENCOUNTER — Encounter: Payer: Self-pay | Admitting: Family Medicine

## 2020-07-01 ENCOUNTER — Ambulatory Visit: Payer: BC Managed Care – PPO | Admitting: Family Medicine

## 2020-07-01 VITALS — BP 124/70 | HR 70 | Temp 97.9°F | Wt 200.0 lb

## 2020-07-01 DIAGNOSIS — F321 Major depressive disorder, single episode, moderate: Secondary | ICD-10-CM | POA: Diagnosis not present

## 2020-07-01 MED ORDER — BUPROPION HCL ER (XL) 150 MG PO TB24: 150.0000 mg | ORAL_TABLET | Freq: Every day | ORAL | 5 refills | Status: DC

## 2020-07-01 NOTE — Progress Notes (Signed)
Established Patient Office Visit  Subjective:  Patient ID: Micheal Graham, male    DOB: [birth year]  Age: 65 y.o. MRN: [id number]  CC:  Chief Complaint  Patient presents with  . Depression    Anxiety/ depression, x 3 months but has gotten worse over the past few weeks, PHQ-9 completed.    HPI Micheal Graham presents for increased anxiety and depression symptoms over the past few months.  He had Covid infection back over the winter and feels he has had a lot of residual symptoms since then.  He was hospitalized briefly.  He had been immunized.  He does have celiac disease and has some immunosuppression related to that.  He is currently on sertraline 100 mg daily and compliant with that.  His symptoms revolve predominantly around loss of interest in activities and pleasure in activities.  He frequently feels down and depressed.  He has frequent fatigue and low motivation.  No suicidal ideation.  Sleep is fairly stable.  Past Medical History:  Diagnosis Date  . Allergy   . 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 Atypical nevus 01/16/2013   moderate-right lower back (WS)  . Atypical nevus 05/29/2013   moderate-mid back   . Basal cell carcinoma 07/15/1999   right neck (EXC)  . Basal cell carcinoma 08/29/1999   sclerosis- right neck (MOHS)  . Basal cell carcinoma 02/15/2008   right shin-(CX35FU)  . Basal cell carcinoma 08/13/2014   nod-right mid back (CX35FU), nod-right side nose (EXC)  . Basal cell carcinoma 08/23/2014   nod-right side nose sup (MOHS)  . Basal cell carcinoma 08/12/2015   sup/nod-left ear rim (CX35FU)  . Basal cell carcinoma 01/22/2016   sup-right scapula (CX35FU)  . Basal cell carcinoma 06/09/2017   sup/nod-left post shoulder (CX35FU)  . Basal cell carcinoma 11/17/2018   sup/nod-right side of nose (CX35FU)  . Basal cell carcinoma 01/26/2019   nod-right side nose bridge, inf  . BCC (basal cell carcinoma) 06/19/2019   below right ear-cx3 and  Excision  . Cancer [facility]) 2013   right shin  . Celiac disease 01/11/2020  . Chronic kidney disease   . Cognitive impairment, mild, so stated 01/23/2014  . Colon polyps   . 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 Hypertension   . Migraines   . Squamous cell carcinoma of skin 09/07/2018   in situ-right mid shin (CX35FU)    Past Surgical History:  Procedure Laterality Date  . ABDOMINAL HERNIA REPAIR  2012  . APPENDECTOMY  1974  . left knee  2011   surgery  . VARICOSE VEIN SURGERY      Family History  Problem Relation Age of Onset  . Hypertension Mother   . Heart disease Father   . Cancer Maternal Uncle        prostate  . Stroke Paternal Grandfather     Social History   Socioeconomic History  . Marital status: Married    Spouse name: Not on file  . Number of children: Not on file  . Years of education: Not on file  . Highest education level: Not on file  Occupational History  . Not on file  Tobacco Use  . Smoking status: Former Smoker    Packs/day: 2.00    Years: 30.00    Pack years: 60.00    Types: Cigarettes    Quit date: 09/09/1999    Years since quitting: 20.8  . Smokeless tobacco: Never Used  Vaping Use  .  Vaping Use: Never used  Substance and Sexual Activity  . Alcohol use: No    Alcohol/week: 0.0 standard drinks  . Drug use: No  . Sexual activity: Not on file  Other Topics Concern  . Not on file  Social History Narrative   Right handed, caffeine 2 cups daily, FT -truck driver, Married, 2 kids, HS graduate   Social Determinants of Health   Financial Resource Strain: Not on file  Food Insecurity: Not on file  Transportation Needs: Not on file  Physical Activity: Not on file  Stress: Not on file  Social Connections: Not on file  Intimate Partner Violence: Not on file    Outpatient Medications Prior to Visit  Medication Sig Dispense Refill  . ACCU-CHEK GUIDE test strip     . Accu-Chek Softclix Lancets lancets 1 each 3 (three) times daily.    Marland Kitchen  acetaminophen (TYLENOL) 325 MG tablet Take 2 tablets (650 mg total) by mouth every 6 (six) hours as needed for mild pain or headache (fever >/= 101).    . blood glucose meter kit and supplies KIT Dispense based on patient and insurance preference. Use up to four times daily as directed. (FOR ICD-9 250.00, 250.01). 1 each 0  . Calcium Carb-Cholecalciferol [phone] MG-UNIT TABS Take 1 tablet by mouth daily.     . Cholecalciferol (VITAMIN D) 2000 units CAPS Take 2,000 Units by mouth daily.     . diclofenac Sodium (VOLTAREN) 1 % GEL Apply 4 g topically 4 (four) times daily. 100 g 1  . diphenhydramine-acetaminophen (TYLENOL PM) 25-500 MG TABS tablet Take 1 tablet by mouth at bedtime as needed (sleep).    . fexofenadine (ALLEGRA) 180 MG tablet Take 180 mg by mouth daily.    . Insulin Pen Needle (PEN NEEDLES 3/16") 31G X 5 MM MISC Use as directed with insulin pen 100 each 0  . magnesium oxide (MAG-OX) 400 MG tablet Take 400 mg by mouth daily.    . Prenatal Multivit-Min-Fe-FA (PRENATAL VITAMINS PO) Take 1 capsule by mouth daily.     . Probiotic CAPS Take 1 capsule by mouth daily.     . sertraline (ZOLOFT) 100 MG tablet Take 1 tablet (100 mg total) by mouth daily. 90 tablet 2  . sildenafil (VIAGRA) 100 MG tablet Take 0.5-1 tablets (50-100 mg total) by mouth daily as needed for erectile dysfunction. 6 tablet 11  . Testosterone 20.25 MG/ACT (1.62%) GEL USE 2 PUMPS ON EACH ARM DAILY 150 g 0   No facility-administered medications prior to visit.    Allergies  Allergen Reactions  . Gluten Meal Diarrhea    ROS Review of Systems  Constitutional: Positive for fatigue. Negative for appetite change and fever.  Cardiovascular: Negative for chest pain.  Gastrointestinal: Negative for abdominal pain.  Neurological: Negative for headaches.  Psychiatric/Behavioral: Positive for dysphoric mood. Negative for agitation, confusion and suicidal ideas. The patient is nervous/anxious.       Objective:     Physical Exam Vitals reviewed.  Constitutional:      Appearance: Normal appearance.  Cardiovascular:     Rate and Rhythm: Normal rate and regular rhythm.  Pulmonary:     Effort: Pulmonary effort is normal.     Breath sounds: Normal breath sounds.  Neurological:     General: No focal deficit present.     Mental Status: He is alert.     Cranial Nerves: No cranial nerve deficit.  Psychiatric:     Comments: PHQ 2=13  BP 124/70 (BP Location: Left Arm, Patient Position: Sitting, Cuff Size: Normal)   Pulse 70   Temp 97.9 F (36.6 C) (Oral)   Wt 200 lb (90.7 kg)   SpO2 98%   BMI 27.12 kg/m  Wt Readings from Last 3 Encounters:  07/01/20 200 lb (90.7 kg)  06/03/20 197 lb (89.4 kg)  02/12/20 218 lb 8 oz (99.1 kg)     Health Maintenance Due  Topic Date Due  . Hepatitis C Screening  Never done    There are no preventive care reminders to display for this patient.  Lab Results  Component Value Date   TSH 1.79 11/12/2016   Lab Results  Component Value Date   WBC 8.1 06/03/2020   HGB 14.5 06/03/2020   HCT 42.9 06/03/2020   MCV 85.9 06/03/2020   PLT 151.0 06/03/2020   Lab Results  Component Value Date   NA 139 02/05/2020   K 4.7 02/05/2020   CO2 26 02/05/2020   GLUCOSE 124 (H) 02/05/2020   BUN 35 (H) 02/05/2020   CREATININE 1.70 (H) 02/05/2020   BILITOT 0.5 02/05/2020   ALKPHOS 67 01/16/2020   AST 18 02/05/2020   ALT 30 02/05/2020   PROT 6.8 02/05/2020   ALBUMIN 2.5 (L) 01/16/2020   CALCIUM 9.4 02/05/2020   ANIONGAP 10 01/17/2020   GFR 48.48 (L) 11/07/2018   Lab Results  Component Value Date   CHOL 199 06/12/2015   Lab Results  Component Value Date   HDL 34.10 (L) 06/12/2015   No results found for: [facility] Lab Results  Component Value Date   TRIG 297 (H) 01/11/2020   Lab Results  Component Value Date   CHOLHDL 6 06/12/2015   Lab Results  Component Value Date   HGBA1C 5.5 06/03/2020      Assessment & Plan:   Major depressive disorder  moderate severity.  Patient already on sertraline 100 mg daily.  We discussed possible addition of Wellbutrin XL 150 mg once daily.  He will give [place] some feedback in the next 3 to 4 weeks. -We also discussed nonpharmacologic factors in treating depression such as counseling and exercise  Meds ordered this encounter  Medications  . buPROPion (WELLBUTRIN XL) 150 MG 24 hr tablet    Sig: Take 1 tablet (150 mg total) by mouth daily.    Dispense:  30 tablet    Refill:  5    Follow-up: No follow-ups on file.    Carolann Littler, MD

## 2020-07-01 NOTE — Patient Instructions (Signed)

## 2020-07-23 ENCOUNTER — Other Ambulatory Visit: Payer: Self-pay | Admitting: Family Medicine

## 2020-09-30 ENCOUNTER — Other Ambulatory Visit: Payer: Self-pay

## 2020-10-01 ENCOUNTER — Encounter: Payer: Self-pay | Admitting: Family Medicine

## 2020-10-01 ENCOUNTER — Ambulatory Visit: Payer: BC Managed Care – PPO | Admitting: Family Medicine

## 2020-10-01 VITALS — BP 110/60 | HR 72 | Temp 97.8°F | Wt 189.0 lb

## 2020-10-01 DIAGNOSIS — M25511 Pain in right shoulder: Secondary | ICD-10-CM | POA: Diagnosis not present

## 2020-10-01 MED ORDER — METHYLPREDNISOLONE ACETATE 40 MG/ML IJ SUSP
40.0000 mg | Freq: Once | INTRAMUSCULAR | Status: AC
Start: 2020-10-01 — End: 2020-10-01
  Administered 2020-10-01: 40 mg via INTRA_ARTICULAR

## 2020-10-01 NOTE — Progress Notes (Signed)
Established Patient Office Visit  Subjective:  Patient ID: Micheal Graham, male    DOB: 09-01-1955  Age: 65 y.o. MRN: [id number]  CC:  Chief Complaint  Patient presents with   Shoulder Pain    X 1 month, r shoulder pain, dull constant pain but worse when moving, no known injury    HPI Micheal Graham presents for at least 1 month history of right shoulder pain.  He actually has some left shoulder pain as well but right is worse.  Similar pain left shoulder years ago injected with steroid and this resolved.  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  Occasional night pain.  Dull constant achy pain worse with internal rotation and abduction.  No weakness.  No cervical radiculitis symptoms.  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   severe-right Graham lower (EXC)   Atypical nevus 01/16/2013   moderate-right lower back (WS)   Atypical nevus 05/29/2013   moderate-mid back    Basal cell carcinoma 07/15/1999   right neck (EXC)   Basal cell carcinoma 08/29/1999   sclerosis- right neck (MOHS)   Basal cell carcinoma 02/15/2008   right Graham-(CX35FU)   Basal cell carcinoma 08/13/2014   nod-right mid back (CX35FU), nod-right side nose (EXC)   Basal cell carcinoma 08/23/2014   nod-right side nose sup (MOHS)   Basal cell carcinoma 08/12/2015   sup/nod-left ear rim (CX35FU)   Basal cell carcinoma 01/22/2016   sup-right scapula (CX35FU)   Basal cell carcinoma 06/09/2017   sup/nod-left post shoulder (CX35FU)   Basal cell carcinoma 11/17/2018   sup/nod-right side of nose (CX35FU)   Basal cell carcinoma 01/26/2019   nod-right side nose bridge, inf   BCC (basal cell carcinoma) 06/19/2019   below right ear-cx3 and Excision   Cancer ([place]) 2013   right Graham   Celiac disease 01/11/2020   Chronic kidney disease    Cognitive impairment, mild, so stated 01/23/2014   Colon polyps     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   Hypertension    Migraines    Squamous cell carcinoma of skin 09/07/2018   in situ-right mid Graham (CX35FU)    Past Surgical History:  Procedure Laterality Date   ABDOMINAL HERNIA REPAIR  2012   APPENDECTOMY  1974   left knee  2011   surgery   VARICOSE VEIN SURGERY      Family History  Problem Relation Age of Onset   Hypertension Mother    Heart disease Father    Cancer Maternal Uncle        prostate   Stroke Paternal Grandfather     Social History   Socioeconomic History   Marital status: Married    Spouse name: Not on file   Number of children: Not on file   Years of education: Not on file   Highest education level: Not on file  Occupational History   Not on file  Tobacco Use   Smoking status: Former    Packs/day: 2.00    Years: 30.00    Pack years: 60.00    Types: Cigarettes    Quit date: 09/09/1999    Years since quitting: 21.0   Smokeless tobacco: Never  Vaping Use   Vaping Use: Never used  Substance and Sexual Activity   Alcohol use: No    Alcohol/week: 0.0 standard drinks   Drug use:  No   Sexual activity: Not on file  Other Topics Concern   Not on file  Social History Narrative   Right handed, caffeine 2 cups daily, FT -truck driver, Married, 2 kids, HS graduate   Social Determinants of Health   Financial Resource Strain: Not on file  Food Insecurity: Not on file  Transportation Needs: Not on file  Physical Activity: Not on file  Stress: Not on file  Social Connections: Not on file  Intimate Partner Violence: Not on file    Outpatient Medications Prior to Visit  Medication Sig Dispense Refill   acetaminophen (TYLENOL) 325 MG tablet Take 2 tablets (650 mg total) by mouth every 6 (six) hours as needed for mild pain or headache (fever >/= 101).     buPROPion (WELLBUTRIN XL) 150 MG 24 hr tablet TAKE 1 TABLET BY MOUTH EVERY DAY 90 tablet 2   Calcium Carb-Cholecalciferol [phone] MG-UNIT TABS Take 1 tablet by mouth  daily.      Cholecalciferol (VITAMIN D) 2000 units CAPS Take 2,000 Units by mouth daily.      diclofenac Sodium (VOLTAREN) 1 % GEL Apply 4 g topically 4 (four) times daily. 100 g 1   diphenhydramine-acetaminophen (TYLENOL PM) 25-500 MG TABS tablet Take 1 tablet by mouth at bedtime as needed (sleep).     fexofenadine (ALLEGRA) 180 MG tablet Take 180 mg by mouth daily.     magnesium oxide (MAG-OX) 400 MG tablet Take 400 mg by mouth daily.     Prenatal Multivit-Min-Fe-FA (PRENATAL VITAMINS PO) Take 1 capsule by mouth daily.      Probiotic CAPS Take 1 capsule by mouth daily.      sertraline (ZOLOFT) 100 MG tablet Take 1 tablet (100 mg total) by mouth daily. 90 tablet 2   sildenafil (VIAGRA) 100 MG tablet Take 0.5-1 tablets (50-100 mg total) by mouth daily as needed for erectile dysfunction. 6 tablet 11   Testosterone 20.25 MG/ACT (1.62%) GEL USE 2 PUMPS ON EACH ARM DAILY 150 g 0   ACCU-CHEK GUIDE test strip      Accu-Chek Softclix Lancets lancets 1 each 3 (three) times daily.     blood glucose meter kit and supplies KIT Dispense based on patient and insurance preference. Use up to four times daily as directed. (FOR ICD-9 250.00, 250.01). 1 each 0   Insulin Pen Needle (PEN NEEDLES 3/16") 31G X 5 MM MISC Use as directed with insulin pen 100 each 0   No facility-administered medications prior to visit.    Allergies  Allergen Reactions   Gluten Meal Diarrhea    ROS Review of Systems  Constitutional:  Negative for chills, fever and unexpected weight change.  Musculoskeletal:  Negative for neck pain.  Neurological:  Negative for weakness and numbness.     Objective:    Physical Exam Vitals reviewed.  Constitutional:      Appearance: Normal appearance.  Cardiovascular:     Rate and Rhythm: Normal rate and regular rhythm.  Pulmonary:     Effort: Pulmonary effort is normal.     Breath sounds: Normal breath sounds.  Musculoskeletal:     Comments: Full range of motion right shoulder.  No  localized tenderness.  He has pain with internal rotation and abduction greater than 90 degrees against resistance.  Neurological:     Mental Status: He is alert.     Comments: No significant rotator cuff weakness    BP 110/60 (BP Location: Left Arm, Patient Position: Sitting, Cuff Size: Normal)  Pulse 72   Temp 97.8 F (36.6 C) (Oral)   Wt 189 lb (85.7 kg)   SpO2 97%   BMI 25.63 kg/m  Wt Readings from Last 3 Encounters:  10/01/20 189 lb (85.7 kg)  07/01/20 200 lb (90.7 kg)  06/03/20 197 lb (89.4 kg)     Health Maintenance Due  Topic Date Due   Hepatitis C Screening  Never done   Zoster Vaccines- Shingrix (1 of 2) Never done   COVID-19 Vaccine (4 - Booster for Moderna series) 05/07/2020    There are no preventive care reminders to display for this patient.  Lab Results  Component Value Date   TSH 1.79 11/12/2016   Lab Results  Component Value Date   WBC 8.1 06/03/2020   HGB 14.5 06/03/2020   HCT 42.9 06/03/2020   MCV 85.9 06/03/2020   PLT 151.0 06/03/2020   Lab Results  Component Value Date   NA 139 02/05/2020   K 4.7 02/05/2020   CO2 26 02/05/2020   GLUCOSE 124 (H) 02/05/2020   BUN 35 (H) 02/05/2020   CREATININE 1.70 (H) 02/05/2020   BILITOT 0.5 02/05/2020   ALKPHOS 67 01/16/2020   AST 18 02/05/2020   ALT 30 02/05/2020   PROT 6.8 02/05/2020   ALBUMIN 2.5 (L) 01/16/2020   CALCIUM 9.4 02/05/2020   ANIONGAP 10 01/17/2020   GFR 48.48 (L) 11/07/2018   Lab Results  Component Value Date   CHOL 199 06/12/2015   Lab Results  Component Value Date   HDL 34.10 (L) 06/12/2015   No results found for: [facility] Lab Results  Component Value Date   TRIG 297 (H) 01/11/2020   Lab Results  Component Value Date   CHOLHDL 6 06/12/2015   Lab Results  Component Value Date   HGBA1C 5.5 06/03/2020      Assessment & Plan:   Right shoulder pain progressive over the past month.  Suspect impingement syndrome/rotator cuff tendinitis.  No evidence for weakness.   No injury.  -Discussed risks and benefits of corticosteroid injection and patient consented.  After prepping skin with betadine, injected 40 mg depomedrol and 2 cc of plain xylocaine with 25 gauge one and one half inch needle using posterior/ lateral approach and pt tolerated well. -Gentle range of motion as tolerated. -Touch base if not improving over the next couple months    Meds ordered this encounter  Medications   methylPREDNISolone acetate (DEPO-MEDROL) injection 40 mg    Follow-up: No follow-ups on file.    Carolann Littler, MD

## 2020-10-28 ENCOUNTER — Ambulatory Visit: Payer: BC Managed Care – PPO | Admitting: Family Medicine

## 2020-10-28 ENCOUNTER — Ambulatory Visit (INDEPENDENT_AMBULATORY_CARE_PROVIDER_SITE_OTHER): Payer: BC Managed Care – PPO | Admitting: Dermatology

## 2020-10-28 ENCOUNTER — Other Ambulatory Visit: Payer: Self-pay

## 2020-10-28 ENCOUNTER — Encounter: Payer: Self-pay | Admitting: Dermatology

## 2020-10-28 ENCOUNTER — Encounter: Payer: Self-pay | Admitting: Family Medicine

## 2020-10-28 VITALS — BP 130/72 | HR 68 | Temp 98.0°F | Ht 72.0 in | Wt 189.8 lb

## 2020-10-28 DIAGNOSIS — D485 Neoplasm of uncertain behavior of skin: Secondary | ICD-10-CM | POA: Diagnosis not present

## 2020-10-28 DIAGNOSIS — R143 Flatulence: Secondary | ICD-10-CM

## 2020-10-28 DIAGNOSIS — I83812 Varicose veins of left lower extremities with pain: Secondary | ICD-10-CM

## 2020-10-28 DIAGNOSIS — C4491 Basal cell carcinoma of skin, unspecified: Secondary | ICD-10-CM

## 2020-10-28 DIAGNOSIS — C44319 Basal cell carcinoma of skin of other parts of face: Secondary | ICD-10-CM | POA: Diagnosis not present

## 2020-10-28 HISTORY — DX: Basal cell carcinoma of skin, unspecified: C44.91

## 2020-10-28 NOTE — Patient Instructions (Signed)

## 2020-10-28 NOTE — Progress Notes (Signed)
Established Patient Office Visit  Subjective:  Patient ID: Micheal Graham, male    DOB: [birth year]  Age: 65 y.o. MRN: [id number]  CC:  Chief Complaint  Patient presents with   Follow-up    HPI Micheal Graham presents for painful varicose veins left leg.  He has chronic problems including history of hypertension, celiac disease, idiopathic peripheral neuropathy, kidney stones.  He has worsening varicosities left lower extremity.  These are starting to become more painful over time.  He has used knee-high compression fairly consistently but still has considerable pain.  He describes as a burning type quality pain.  This seems to be centered over the veins.  No erythema.  No ulceration.  He would like to see venous specialist.  He has tried elevation and compression without much benefit  Second issue is frequent flatulence symptoms.  No abdominal pain.  No bloating.  Does have celiac disease but has been going gluten-free.  No recent diarrhea symptoms.    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   severe-right shin lower (EXC)   Atypical nevus 01/16/2013   moderate-right lower back (WS)   Atypical nevus 05/29/2013   moderate-mid back    Basal cell carcinoma 07/15/1999   right neck (EXC)   Basal cell carcinoma 08/29/1999   sclerosis- right neck (MOHS)   Basal cell carcinoma 02/15/2008   right shin-(CX35FU)   Basal cell carcinoma 08/13/2014   nod-right mid back (CX35FU), nod-right side nose (EXC)   Basal cell carcinoma 08/23/2014   nod-right side nose sup (MOHS)   Basal cell carcinoma 08/12/2015   sup/nod-left ear rim (CX35FU)   Basal cell carcinoma 01/22/2016   sup-right scapula (CX35FU)   Basal cell carcinoma 06/09/2017   sup/nod-left post shoulder (CX35FU)   Basal cell carcinoma 11/17/2018   sup/nod-right side of nose (CX35FU)   Basal cell carcinoma 01/26/2019   nod-right side nose bridge, inf   BCC (basal cell carcinoma) 06/19/2019    below right ear-cx3 and Excision   Cancer ([place]) 2013   right shin   Celiac disease 01/11/2020   Chronic kidney disease    Cognitive impairment, mild, so stated 01/23/2014   Colon polyps    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   Hypertension    Migraines    Squamous cell carcinoma of skin 09/07/2018   in situ-right mid shin (CX35FU)    Past Surgical History:  Procedure Laterality Date   ABDOMINAL HERNIA REPAIR  2012   APPENDECTOMY  1974   left knee  2011   surgery   VARICOSE VEIN SURGERY      Family History  Problem Relation Age of Onset   Hypertension Mother    Heart disease Father    Cancer Maternal Uncle        prostate   Stroke Paternal Grandfather     Social History   Socioeconomic History   Marital status: Married    Spouse name: Not on file   Number of children: Not on file   Years of education: Not on file   Highest education level: Not on file  Occupational History   Not on file  Tobacco Use   Smoking status: Former    Packs/day: 2.00    Years: 30.00    Pack years: 60.00    Types: Cigarettes    Quit date: 09/09/1999    Years since quitting: 21.1   Smokeless tobacco: Never  Vaping Use   Vaping  Use: Never used  Substance and Sexual Activity   Alcohol use: No    Alcohol/week: 0.0 standard drinks   Drug use: No   Sexual activity: Not on file  Other Topics Concern   Not on file  Social History Narrative   Right handed, caffeine 2 cups daily, FT -truck driver, Married, 2 kids, HS graduate   Social Determinants of Health   Financial Resource Strain: Not on file  Food Insecurity: Not on file  Transportation Needs: Not on file  Physical Activity: Not on file  Stress: Not on file  Social Connections: Not on file  Intimate Partner Violence: Not on file    Outpatient Medications Prior to Visit  Medication Sig Dispense Refill   acetaminophen (TYLENOL) 325 MG tablet Take 2 tablets (650 mg total) by mouth every 6 (six) hours as needed for mild pain or  headache (fever >/= 101).     buPROPion (WELLBUTRIN XL) 150 MG 24 hr tablet TAKE 1 TABLET BY MOUTH EVERY DAY 90 tablet 2   Calcium Carb-Cholecalciferol [phone] MG-UNIT TABS Take 1 tablet by mouth daily.      Cholecalciferol (VITAMIN D) 2000 units CAPS Take 2,000 Units by mouth daily.      diclofenac Sodium (VOLTAREN) 1 % GEL Apply 4 g topically 4 (four) times daily. 100 g 1   diphenhydramine-acetaminophen (TYLENOL PM) 25-500 MG TABS tablet Take 1 tablet by mouth at bedtime as needed (sleep).     fexofenadine (ALLEGRA) 180 MG tablet Take 180 mg by mouth daily.     magnesium oxide (MAG-OX) 400 MG tablet Take 400 mg by mouth daily.     Prenatal Multivit-Min-Fe-FA (PRENATAL VITAMINS PO) Take 1 capsule by mouth daily.      Probiotic CAPS Take 1 capsule by mouth daily.      sertraline (ZOLOFT) 100 MG tablet Take 1 tablet (100 mg total) by mouth daily. 90 tablet 2   sildenafil (VIAGRA) 100 MG tablet Take 0.5-1 tablets (50-100 mg total) by mouth daily as needed for erectile dysfunction. 6 tablet 11   Testosterone 20.25 MG/ACT (1.62%) GEL USE 2 PUMPS ON EACH ARM DAILY 150 g 0   No facility-administered medications prior to visit.    Allergies  Allergen Reactions   Gluten Meal Diarrhea    ROS Review of Systems  Constitutional:  Negative for chills and fever.  Gastrointestinal:  Negative for abdominal distention, abdominal pain, blood in stool, constipation, nausea and vomiting.     Objective:    Physical Exam Vitals reviewed.  Constitutional:      Appearance: Normal appearance.  Cardiovascular:     Rate and Rhythm: Normal rate and regular rhythm.  Pulmonary:     Effort: Pulmonary effort is normal.     Breath sounds: Normal breath sounds.  Musculoskeletal:     Comments: Prominent varicose veins left lower extremity from the calf down to the foot.  These are slightly tender to palpation.  No erythema.  No warmth.  Feet are warm to touch with good capillary refill throughout.  Good distal  pulses.  Neurological:     Mental Status: He is alert.    BP 130/72   Pulse 68   Temp 98 F (36.7 C) (Oral)   Ht 6' (1.829 m)   Wt 189 lb 12.8 oz (86.1 kg)   SpO2 98%   BMI 25.74 kg/m  Wt Readings from Last 3 Encounters:  10/28/20 189 lb 12.8 oz (86.1 kg)  10/01/20 189 lb (85.7 kg)  07/01/20  200 lb (90.7 kg)     Health Maintenance Due  Topic Date Due   Hepatitis C Screening  Never done   Zoster Vaccines- Shingrix (1 of 2) Never done   COVID-19 Vaccine (4 - Booster for Moderna series) 05/07/2020    There are no preventive care reminders to display for this patient.  Lab Results  Component Value Date   TSH 1.79 11/12/2016   Lab Results  Component Value Date   WBC 8.1 06/03/2020   HGB 14.5 06/03/2020   HCT 42.9 06/03/2020   MCV 85.9 06/03/2020   PLT 151.0 06/03/2020   Lab Results  Component Value Date   NA 139 02/05/2020   K 4.7 02/05/2020   CO2 26 02/05/2020   GLUCOSE 124 (H) 02/05/2020   BUN 35 (H) 02/05/2020   CREATININE 1.70 (H) 02/05/2020   BILITOT 0.5 02/05/2020   ALKPHOS 67 01/16/2020   AST 18 02/05/2020   ALT 30 02/05/2020   PROT 6.8 02/05/2020   ALBUMIN 2.5 (L) 01/16/2020   CALCIUM 9.4 02/05/2020   ANIONGAP 10 01/17/2020   GFR 48.48 (L) 11/07/2018   Lab Results  Component Value Date   CHOL 199 06/12/2015   Lab Results  Component Value Date   HDL 34.10 (L) 06/12/2015   No results found for: [facility] Lab Results  Component Value Date   TRIG 297 (H) 01/11/2020   Lab Results  Component Value Date   CHOLHDL 6 06/12/2015   Lab Results  Component Value Date   HGBA1C 5.5 06/03/2020      Assessment & Plan:   #1 symptomatic varicose veins left lower extremity.  He is tried elevation and compression without much benefit.  He is requesting referral to vascular surgeon and we will set this up  #2 increased flatulence symptoms.  No concerning symptoms in terms of appetite change, weight loss, stool change, etc. he has history of celiac  disease but has been watching closely for glutens  -Handout given on low FODMAP diet   No orders of the defined types were placed in this encounter.   Follow-up: No follow-ups on file.    Carolann Littler, MD

## 2020-10-28 NOTE — Progress Notes (Signed)
Pre visit review using our clinic review tool, if applicable. No additional management support is needed unless otherwise documented below in the visit note. 

## 2020-11-03 ENCOUNTER — Encounter: Payer: Self-pay | Admitting: Dermatology

## 2020-11-03 NOTE — Progress Notes (Signed)
   Follow-Up Visit   Subjective  Micheal Graham is a 65 y.o. male who presents for the following: Follow-up (Nose lesion broke open near old surgical spot, post right ear and left neck).  New crusts, one on nose near an old site of skin cancer. Location:  Duration:  Quality:  Associated Signs/Symptoms: Modifying Factors:  Severity:  Timing: Context:   Objective  Well appearing patient in no apparent distress; mood and affect are within normal limits. Right Submandibular Area Pink centrally eroded 7 mm waxy papule     Right Ala Nasi Tiny central erosion in a pink Pearly 7 mm lesion       A focused examination was performed including head and neck.  No lesion found by patient to me today on left neck.. Relevant physical exam findings are noted in the Assessment and Plan.   Assessment & Plan    Neoplasm of uncertain behavior of skin (2) Right Submandibular Area  Skin / nail biopsy Type of biopsy: tangential   Informed consent: discussed and consent obtained   Timeout: patient name, date of birth, surgical site, and procedure verified   Anesthesia: the lesion was anesthetized in a standard fashion   Anesthetic:  1% lidocaine w/ epinephrine 1-100,000 local infiltration Instrument used: flexible razor blade   Hemostasis achieved with: ferric subsulfate   Outcome: patient tolerated procedure well   Post-procedure details: sterile dressing applied and wound care instructions given   Dressing type: bandage and petrolatum    Specimen 1 - Surgical pathology Differential Diagnosis: bcc scc  Check Margins: No  Right Ala Nasi  Skin / nail biopsy Type of biopsy: tangential   Informed consent: discussed and consent obtained   Timeout: patient name, date of birth, surgical site, and procedure verified   Anesthesia: the lesion was anesthetized in a standard fashion   Anesthetic:  1% lidocaine w/ epinephrine 1-100,000 local infiltration Instrument used: flexible razor  blade   Hemostasis achieved with: ferric subsulfate   Outcome: patient tolerated procedure well   Post-procedure details: sterile dressing applied and wound care instructions given   Dressing type: bandage and petrolatum    Specimen 2 - Surgical pathology Differential Diagnosis: bcc scc previous MOHS   Check Margins: No      I, [name], MD, have reviewed all documentation for this visit.  The documentation on 11/03/20 for the exam, diagnosis, procedures, and orders are all accurate and complete.

## 2020-11-05 ENCOUNTER — Telehealth: Payer: Self-pay

## 2020-11-05 NOTE — Telephone Encounter (Signed)
-----   Message from [name], MD sent at 11/04/2020  7:13 PM EDT ----- Schedule surgery with Dr. Darene Lamer

## 2020-11-05 NOTE — Telephone Encounter (Signed)
Phone call to patient with his pathology results. Patient aware of results.  

## 2020-11-10 ENCOUNTER — Encounter: Payer: Self-pay | Admitting: Dermatology

## 2020-11-10 NOTE — Progress Notes (Signed)
   Follow-Up Visit   Subjective  Micheal Graham is a 65 y.o. male who presents for the following: Follow-up (6 month no concerns). Recheck sites of skin cancer and atypical moles, check for any new suspicious spots Location:  Duration:  Quality:  Associated Signs/Symptoms: Modifying Factors:  Severity:  Timing: Context:   Objective  Well appearing patient in no apparent distress; mood and affect are within normal limits. Chest - Medial [facility]) No sign residual skin cancer  Chest - Medial (Center) No sign residual skin cancer  Neck - Anterior No new or recurrent atypical pigmented lesions  Head - Anterior (Face) Head to toe exam done today no signs of atypical moles, melanoma or non mole skin cancer.  Neck - Anterior (2) Fleshy, skin-colored sessile and pedunculated papules.    Left Abdomen (side) - Upper, Left Upper Back Erythematous stuck-on, waxy papule or plaque.     A full examination was performed including scalp, head, eyes, ears, nose, lips, neck, chest, axillae, abdomen, back, buttocks, bilateral upper extremities, bilateral lower extremities, hands, feet, fingers, toes, fingernails, and toenails. All findings within normal limits unless otherwise noted below.   Assessment & Plan    History of basal cell cancer Chest - Medial [facility])  Check as needed change  History of squamous cell carcinoma Chest - Medial (Center)  Check.  As needed change  History of atypical nevus Neck - Anterior  Check as needed change  Skin exam for malignant neoplasm Head - Anterior (Face)  6 month skin check.  Skin tag (2) Neck - Anterior  Leave unless patient wants removed.  Inflamed seborrheic keratosis Left Abdomen (side) - Upper; Left Upper Back  Okay to leave no treatment needed.      I, Micheal Monarch, MD, have reviewed all documentation for this visit.  The documentation on 11/10/20 for the exam, diagnosis, procedures, and orders are all accurate and  complete.

## 2020-12-10 ENCOUNTER — Encounter: Payer: Self-pay | Admitting: Family Medicine

## 2020-12-11 ENCOUNTER — Encounter: Payer: Self-pay | Admitting: Family Medicine

## 2020-12-11 ENCOUNTER — Telehealth: Payer: Self-pay | Admitting: Family Medicine

## 2020-12-11 NOTE — Telephone Encounter (Signed)
Patient dropped off forms that he would like Dr. Elease Hashimoto to complete.   Patient would like a call at [phone] once forms are completed.  Forms will be placed in folder.  Please advise.

## 2020-12-11 NOTE — Telephone Encounter (Signed)
Forms placed in Dr. Erick Blinks red folder.

## 2020-12-12 ENCOUNTER — Encounter: Payer: Self-pay | Admitting: Family Medicine

## 2021-01-07 ENCOUNTER — Other Ambulatory Visit: Payer: Self-pay

## 2021-01-07 DIAGNOSIS — I83812 Varicose veins of left lower extremities with pain: Secondary | ICD-10-CM

## 2021-01-22 ENCOUNTER — Encounter: Payer: BC Managed Care – PPO | Admitting: Vascular Surgery

## 2021-01-22 ENCOUNTER — Encounter (HOSPITAL_COMMUNITY): Payer: BC Managed Care – PPO

## 2021-01-24 ENCOUNTER — Encounter: Payer: Self-pay | Admitting: Family Medicine

## 2021-02-06 ENCOUNTER — Encounter: Payer: Self-pay | Admitting: Family Medicine

## 2021-02-12 ENCOUNTER — Other Ambulatory Visit: Payer: Self-pay

## 2021-02-12 ENCOUNTER — Encounter: Payer: Self-pay | Admitting: Family Medicine

## 2021-02-12 ENCOUNTER — Ambulatory Visit (INDEPENDENT_AMBULATORY_CARE_PROVIDER_SITE_OTHER): Payer: BC Managed Care – PPO | Admitting: Family Medicine

## 2021-02-12 VITALS — BP 118/62 | HR 80 | Temp 97.8°F | Wt 192.0 lb

## 2021-02-12 DIAGNOSIS — M7672 Peroneal tendinitis, left leg: Secondary | ICD-10-CM | POA: Diagnosis not present

## 2021-02-12 NOTE — Patient Instructions (Signed)
Use compression and elevate frequently  Ice 20-30 minutes 3-4 times daily   Consider OTC Diclofenac/Voltaren gel 3-4 times daily.

## 2021-02-12 NOTE — Progress Notes (Signed)
Established Patient Office Visit  Subjective:  Patient ID: Micheal Graham, male    DOB: Jun 17, 1955  Age: 65 y.o. MRN: [id number]  CC:  Chief Complaint  Patient presents with   Leg Pain    Left leg/ ankle, x 4 days, no known injury, very painful when he moves it at all, when he is still there is no pain    HPI Micheal Graham presents with onset about 4 days ago some left anterior and lateral lower leg pain.  No known injury.  Swelling worse at the end of the day.  Works on his feet a lot.  Does have history of fairly severe venous disease left lower extremity and recently saw vascular surgeon for that.  He denies any calf pain.  No thigh pain.  He has noticed some crepitus with dorsiflexion left leg anteriorly.  Does have some pain left anterior and lateral leg with ambulation.  No recent ankle inversion injury.  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   severe-right shin lower (EXC)   Atypical nevus 01/16/2013   moderate-right lower back (WS)   Atypical nevus 05/29/2013   moderate-mid back    Basal cell carcinoma 07/15/1999   right neck (EXC)   Basal cell carcinoma 08/29/1999   sclerosis- right neck (MOHS)   Basal cell carcinoma 02/15/2008   right shin-(CX35FU)   Basal cell carcinoma 08/13/2014   nod-right mid back (CX35FU), nod-right side nose (EXC)   Basal cell carcinoma 08/23/2014   nod-right side nose sup (MOHS)   Basal cell carcinoma 08/12/2015   sup/nod-left ear rim (CX35FU)   Basal cell carcinoma 01/22/2016   sup-right scapula (CX35FU)   Basal cell carcinoma 06/09/2017   sup/nod-left post shoulder (CX35FU)   Basal cell carcinoma 11/17/2018   sup/nod-right side of nose (CX35FU)   Basal cell carcinoma 01/26/2019   nod-right side nose bridge, inf   BCC (basal cell carcinoma) 06/19/2019   below right ear-cx3 and Excision   Cancer ([place]) 2013   right shin   Celiac disease 01/11/2020   Chronic kidney disease    Cognitive  impairment, mild, so stated 01/23/2014   Colon polyps    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   Hypertension    Migraines    Nodular infiltrative basal cell carcinoma (BCC) 10/28/2020   Right Submandibular area   Squamous cell carcinoma of skin 09/07/2018   in situ-right mid shin (CX35FU)    Past Surgical History:  Procedure Laterality Date   ABDOMINAL HERNIA REPAIR  2012   APPENDECTOMY  1974   left knee  2011   surgery   VARICOSE VEIN SURGERY      Family History  Problem Relation Age of Onset   Hypertension Mother    Heart disease Father    Cancer Maternal Uncle        prostate   Stroke Paternal Grandfather     Social History   Socioeconomic History   Marital status: Married    Spouse name: Not on file   Number of children: Not on file   Years of education: Not on file   Highest education level: 12th grade  Occupational History   Not on file  Tobacco Use   Smoking status: Former    Packs/day: 2.00    Years: 30.00    Pack years: 60.00    Types: Cigarettes    Quit date: 09/09/1999    Years since quitting: 21.4   Smokeless  tobacco: Never  Vaping Use   Vaping Use: Never used  Substance and Sexual Activity   Alcohol use: No    Alcohol/week: 0.0 standard drinks   Drug use: No   Sexual activity: Not on file  Other Topics Concern   Not on file  Social History Narrative   Right handed, caffeine 2 cups daily, FT -truck driver, Married, 2 kids, HS graduate   Social Determinants of Health   Financial Resource Strain: Low Risk    Difficulty of Paying Living Expenses: Not hard at all  Food Insecurity: No Food Insecurity   Worried About Charity fundraiser in the Last Year: Never true   Arboriculturist in the Last Year: Never true  Transportation Needs: No Transportation Needs   Lack of Transportation (Medical): No   Lack of Transportation (Non-Medical): No  Physical Activity: Insufficiently Active   Days of Exercise per Week: 2 days   Minutes of Exercise per Session:  50 min  Stress: No Stress Concern Present   Feeling of Stress : Only a little  Social Connections: Unknown   Frequency of Communication with Friends and Family: More than three times a week   Frequency of Social Gatherings with Friends and Family: Patient refused   Attends Religious Services: Patient refused   Marine scientist or Organizations: No   Attends Music therapist: Not on file   Marital Status: Married  Human resources officer Violence: Not on file    Outpatient Medications Prior to Visit  Medication Sig Dispense Refill   acetaminophen (TYLENOL) 325 MG tablet Take 2 tablets (650 mg total) by mouth every 6 (six) hours as needed for mild pain or headache (fever >/= 101).     buPROPion (WELLBUTRIN XL) 150 MG 24 hr tablet TAKE 1 TABLET BY MOUTH EVERY DAY 90 tablet 2   Calcium Carb-Cholecalciferol [phone] MG-UNIT TABS Take 1 tablet by mouth daily.      Cholecalciferol (VITAMIN D) 2000 units CAPS Take 2,000 Units by mouth daily.      diphenhydramine-acetaminophen (TYLENOL PM) 25-500 MG TABS tablet Take 1 tablet by mouth at bedtime as needed (sleep).     fexofenadine (ALLEGRA) 180 MG tablet Take 180 mg by mouth daily.     magnesium oxide (MAG-OX) 400 MG tablet Take 400 mg by mouth daily.     Prenatal Multivit-Min-Fe-FA (PRENATAL VITAMINS PO) Take 1 capsule by mouth daily.      Probiotic CAPS Take 1 capsule by mouth daily.      sertraline (ZOLOFT) 100 MG tablet Take 1 tablet (100 mg total) by mouth daily. 90 tablet 2   sildenafil (VIAGRA) 100 MG tablet Take 0.5-1 tablets (50-100 mg total) by mouth daily as needed for erectile dysfunction. 6 tablet 11   Testosterone 20.25 MG/ACT (1.62%) GEL USE 2 PUMPS ON EACH ARM DAILY 150 g 0   diclofenac Sodium (VOLTAREN) 1 % GEL Apply 4 g topically 4 (four) times daily. 100 g 1   No facility-administered medications prior to visit.    Allergies  Allergen Reactions   Gluten Meal Diarrhea    ROS Review of Systems   Constitutional:  Negative for chills and fever.  Respiratory:  Negative for shortness of breath.   Cardiovascular:  Negative for chest pain.     Objective:    Physical Exam Vitals reviewed.  Constitutional:      Appearance: Normal appearance.  Cardiovascular:     Rate and Rhythm: Normal rate.  Pulmonary:  Effort: Pulmonary effort is normal.     Breath sounds: Normal breath sounds.  Musculoskeletal:     Comments: He has prominent varicose veins left leg.  He has some swelling left lower lateral and anterior leg with slight warmth.  He has crepitus over his extensor tendons of the lower leg with dorsiflexion.  No bony knee tenderness around ankle region.  Good distal capillary refill throughout.  Achilles tendon intact.  No calf tenderness.  Neurological:     Mental Status: He is alert.    BP 118/62 (BP Location: Left Arm, Patient Position: Sitting, Cuff Size: Normal)   Pulse 80   Temp 97.8 F (36.6 C) (Oral)   Wt 192 lb (87.1 kg)   SpO2 96%   BMI 26.04 kg/m  Wt Readings from Last 3 Encounters:  02/12/21 192 lb (87.1 kg)  10/28/20 189 lb 12.8 oz (86.1 kg)  10/01/20 189 lb (85.7 kg)     Health Maintenance Due  Topic Date Due   Hepatitis C Screening  Never done   Zoster Vaccines- Shingrix (1 of 2) Never done    There are no preventive care reminders to display for this patient.  Lab Results  Component Value Date   TSH 1.79 11/12/2016   Lab Results  Component Value Date   WBC 8.1 06/03/2020   HGB 14.5 06/03/2020   HCT 42.9 06/03/2020   MCV 85.9 06/03/2020   PLT 151.0 06/03/2020   Lab Results  Component Value Date   NA 139 02/05/2020   K 4.7 02/05/2020   CO2 26 02/05/2020   GLUCOSE 124 (H) 02/05/2020   BUN 35 (H) 02/05/2020   CREATININE 1.70 (H) 02/05/2020   BILITOT 0.5 02/05/2020   ALKPHOS 67 01/16/2020   AST 18 02/05/2020   ALT 30 02/05/2020   PROT 6.8 02/05/2020   ALBUMIN 2.5 (L) 01/16/2020   CALCIUM 9.4 02/05/2020   ANIONGAP 10 01/17/2020    GFR 48.48 (L) 11/07/2018   Lab Results  Component Value Date   CHOL 199 06/12/2015   Lab Results  Component Value Date   HDL 34.10 (L) 06/12/2015   No results found for: [facility] Lab Results  Component Value Date   TRIG 297 (H) 01/11/2020   Lab Results  Component Value Date   CHOLHDL 6 06/12/2015   Lab Results  Component Value Date   HGBA1C 5.5 06/03/2020      Assessment & Plan:   Suspect tendinitis left leg peroneus tendons.  His pain is anterior and lateral.  He does have severe venous disease but clinically no presentation to suggest likely acute DVT.  Does have some crepitus as above suggesting likely tendon inflammation  -Recommend he try some icing 20 minutes 2-3 times daily -Ace wrap applied for some local compression -Consider over-the-counter topical Voltaren gel 3-4 times daily -Recommend out of work through the remainder of this week -Touch base if not improving by next week  No orders of the defined types were placed in this encounter.   Follow-up: No follow-ups on file.    Carolann Littler, MD

## 2021-03-26 ENCOUNTER — Other Ambulatory Visit: Payer: Self-pay | Admitting: Family Medicine

## 2021-04-09 ENCOUNTER — Ambulatory Visit: Payer: BC Managed Care – PPO | Admitting: Vascular Surgery

## 2021-04-09 ENCOUNTER — Encounter: Payer: Self-pay | Admitting: Vascular Surgery

## 2021-04-09 ENCOUNTER — Other Ambulatory Visit: Payer: Self-pay

## 2021-04-09 ENCOUNTER — Ambulatory Visit (HOSPITAL_COMMUNITY)
Admission: RE | Admit: 2021-04-09 | Discharge: 2021-04-09 | Disposition: A | Discharge status: Home / Self Care | Payer: BC Managed Care – PPO | Source: Ambulatory Visit | Attending: Vascular Surgery | Admitting: Vascular Surgery

## 2021-04-09 VITALS — BP 113/75 | HR 92 | Temp 98.6°F | Resp 18 | Ht 72.0 in | Wt 200.0 lb

## 2021-04-09 DIAGNOSIS — I83812 Varicose veins of left lower extremities with pain: Secondary | ICD-10-CM | POA: Diagnosis present

## 2021-04-09 DIAGNOSIS — I872 Venous insufficiency (chronic) (peripheral): Secondary | ICD-10-CM

## 2021-04-09 NOTE — Progress Notes (Signed)
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  We have discussed conservative measures including the importance of leg elevation and the proper positioning for this.  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  I have also recommended that he be fitted for thigh-high compression stockings with a gradient of 20 to 30 mmHg and we will try to get him fitted today for this.  In addition we discussed importance of exercise specifically walking.  He knows to do some walking when he takes breaks on his long drives.  We also discussed the importance of trying to avoid prolonged sitting and standing.  I also encouraged him to keep his skin well lubricated especially in the winter.  If his symptoms do not improve with thigh-high compression stockings I think he would be a good candidate for laser ablation of the left great saphenous vein down to the proximal calf.  The vein is markedly dilated with diameters ranging from 8 to 11 mm.  He would also require greater than 20 stab phlebectomies.  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  I will plan on seeing him back in 3 months.  He knows to call sooner if he has problems.  REASON FOR CONSULT:    Painful varicose veins of the left lower extremity.  A consult is requested by Dr. Cleon Gustin.   HPI:   Micheal Graham is a 66 y.o. male who presents with a long history of varicose veins.  He had his right great saphenous vein stripped when he was about 66 years old.  He has subsequently developed gradual onset of large varicose veins in the left leg.  Over the last 6 to 8 months these have been especially bothersome.  He experiences significant burning pain especially  in the medial left calf.  The symptoms are relieved with compression stockings.  He currently wears a knee-high stocking with a gradient of 15 to 20 mmHg.  Has not been elevating his legs.  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  He denies any previous history of DVT.  His symptoms are mostly limited to burning pain.  He has noted some swelling in the left leg which seems to be larger than the right leg.  His symptoms are aggravated by sitting and standing.  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   severe-right shin lower (EXC)   Atypical nevus 01/16/2013   moderate-right lower back (WS)   Atypical nevus 05/29/2013   moderate-mid back    Basal cell carcinoma 07/15/1999   right neck (EXC)   Basal cell carcinoma 08/29/1999   sclerosis- right neck (MOHS)   Basal cell carcinoma 02/15/2008   right shin-(CX35FU)   Basal cell carcinoma 08/13/2014   nod-right mid back (CX35FU), nod-right side nose (EXC)   Basal cell carcinoma 08/23/2014   nod-right side nose sup (MOHS)   Basal cell carcinoma 08/12/2015   sup/nod-left ear rim (CX35FU)   Basal cell carcinoma 01/22/2016   sup-right scapula (CX35FU)   Basal cell carcinoma 06/09/2017   sup/nod-left post shoulder (CX35FU)   Basal cell carcinoma 11/17/2018   sup/nod-right side of nose (CX35FU)  Basal cell carcinoma 01/26/2019   nod-right side nose bridge, inf   BCC (basal cell carcinoma) 06/19/2019   below right ear-cx3 and Excision   Cancer ([place]) 2013   right shin   Celiac disease 01/11/2020   Chronic kidney disease    Cognitive impairment, mild, so stated 01/23/2014   Colon polyps    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   Hypertension    Migraines    Nodular infiltrative basal cell carcinoma (BCC) 10/28/2020   Right Submandibular area   Squamous cell carcinoma of skin 09/07/2018   in situ-right mid shin (CX35FU)    Family History  Problem Relation Age of Onset   Hypertension Mother     Heart disease Father    Cancer Maternal Uncle        prostate   Stroke Paternal Grandfather     SOCIAL HISTORY: Social History   Tobacco Use   Smoking status: Former    Packs/day: 2.00    Years: 30.00    Pack years: 60.00    Types: Cigarettes    Quit date: 09/09/1999    Years since quitting: 21.5   Smokeless tobacco: Never  Substance Use Topics   Alcohol use: No    Alcohol/week: 0.0 standard drinks    Allergies  Allergen Reactions   Gluten Meal Diarrhea    Current Outpatient Medications  Medication Sig Dispense Refill   acetaminophen (TYLENOL) 325 MG tablet Take 2 tablets (650 mg total) by mouth every 6 (six) hours as needed for mild pain or headache (fever >/= 101).     buPROPion (WELLBUTRIN XL) 150 MG 24 hr tablet TAKE 1 TABLET BY MOUTH EVERY DAY 90 tablet 2   Calcium Carb-Cholecalciferol [phone] MG-UNIT TABS Take 1 tablet by mouth daily.      Cholecalciferol (VITAMIN D) 2000 units CAPS Take 2,000 Units by mouth daily.      diphenhydramine-acetaminophen (TYLENOL PM) 25-500 MG TABS tablet Take 1 tablet by mouth at bedtime as needed (sleep).     fexofenadine (ALLEGRA) 180 MG tablet Take 180 mg by mouth daily.     magnesium oxide (MAG-OX) 400 MG tablet Take 400 mg by mouth daily.     Prenatal Multivit-Min-Fe-FA (PRENATAL VITAMINS PO) Take 1 capsule by mouth daily.      Probiotic CAPS Take 1 capsule by mouth daily.      sertraline (ZOLOFT) 100 MG tablet Take 1 tablet (100 mg total) by mouth daily. 90 tablet 2   sildenafil (VIAGRA) 100 MG tablet Take 0.5-1 tablets (50-100 mg total) by mouth daily as needed for erectile dysfunction. (Patient not taking: Reported on 04/09/2021) 6 tablet 11   Testosterone 20.25 MG/ACT (1.62%) GEL USE 2 PUMPS ON EACH ARM DAILY (Patient not taking: Reported on 04/09/2021) 150 g 0   No current facility-administered medications for this visit.    REVIEW OF SYSTEMS:  [X]  denotes positive finding, [ ]  denotes negative finding Cardiac  Comments:   Chest pain or chest pressure:    Shortness of breath upon exertion:    Short of breath when lying flat:    Irregular heart rhythm:        Vascular    Pain in calf, thigh, or hip brought on by ambulation:    Pain in feet at night that wakes you up from your sleep:     Blood clot in your veins:    Leg swelling:  x       Pulmonary    Oxygen at home:  Productive cough:     Wheezing:         Neurologic    Sudden weakness in arms or legs:     Sudden numbness in arms or legs:     Sudden onset of difficulty speaking or slurred speech:    Temporary loss of vision in one eye:     Problems with dizziness:         Gastrointestinal    Blood in stool:     Vomited blood:         Genitourinary    Burning when urinating:     Blood in urine:        Psychiatric    Major depression:         Hematologic    Bleeding problems:    Problems with blood clotting too easily:        Skin    Rashes or ulcers:        Constitutional    Fever or chills:    -  PHYSICAL EXAM:   Vitals:   04/09/21 1316  BP: 113/75  Pulse: 92  Resp: 18  Temp: 98.6 F (37 C)  TempSrc: Temporal  SpO2: 97%  Weight: 200 lb (90.7 kg)  Height: 6' (1.829 m)   Body mass index is 27.12 kg/m. GENERAL: The patient is a well-nourished male, in no acute distress. The vital signs are documented above. CARDIAC: There is a regular rate and rhythm.  VASCULAR: I do not detect carotid bruits. He has palpable pedal pulses. He has some large varicose veins in his medial right calf where he likely has an incompetent perforator. He does have hyperpigmentation in the left leg.  He also has mild swelling.  In addition he has corona phlebectatica.        I did look at the left great saphenous vein myself with the SonoSite and the vein is markedly dilated throughout.  In some areas its less than a centimeter from the skin.  We would likely cannulate the vein just below the knee.  PULMONARY: There is good air exchange  bilaterally without wheezing or rales. ABDOMEN: Soft and non-tender with normal pitched bowel sounds.  MUSCULOSKELETAL: There are no major deformities. NEUROLOGIC: No focal weakness or paresthesias are detected. SKIN: There are no ulcers or rashes noted. PSYCHIATRIC: The patient has a normal affect.  DATA:    VENOUS DUPLEX: I have independently interpreted his venous duplex scan today.  There is no evidence of DVT.  He has deep venous reflux involving the common femoral vein.  There is superficial venous reflux in the left great saphenous vein from the saphenofemoral junction to the proximal calf.  Diameters of the vein ranged from 8 to 11 mm.  There is also incompetent perforator in the proximal calf.     Deitra Mayo [facility]

## 2021-04-21 ENCOUNTER — Ambulatory Visit: Payer: BC Managed Care – PPO | Admitting: Family Medicine

## 2021-04-21 VITALS — BP 140/64 | HR 70 | Temp 97.8°F | Wt 201.2 lb

## 2021-04-21 DIAGNOSIS — R5383 Other fatigue: Secondary | ICD-10-CM

## 2021-04-21 DIAGNOSIS — E349 Endocrine disorder, unspecified: Secondary | ICD-10-CM

## 2021-04-21 DIAGNOSIS — F339 Major depressive disorder, recurrent, unspecified: Secondary | ICD-10-CM

## 2021-04-21 LAB — TESTOSTERONE: Testosterone: 316.26 ng/dL (ref 300.00–890.00)

## 2021-04-21 LAB — TSH: TSH: 2.41 u[IU]/mL (ref 0.35–5.50)

## 2021-04-21 MED ORDER — SERTRALINE HCL 100 MG PO TABS: 100.0000 mg | ORAL_TABLET | Freq: Every day | ORAL | 3 refills | Status: DC

## 2021-04-21 NOTE — Patient Instructions (Signed)
Consider Shingrix and check on coverage  Let me know in 2-3 weeks how feeling on increase dose of Wellbutrin- 300 mg

## 2021-04-21 NOTE — Progress Notes (Signed)
Established Patient Office Visit  Subjective:  Patient ID: Micheal Graham, male    DOB: 1955/09/26  Age: 66 y.o. MRN: [id number]  CC:  Chief Complaint  Patient presents with   Depression    Feels like depression is getting worse. Always hungry, very fatigued    HPI Micheal Graham presents with concerns for some ongoing depression.  He is currently on sertraline 100 mg daily and takes Wellbutrin XL 150 mg daily.  Denies any suicidal ideation.  He states his major issue is low motivation and occasional low energy especially early in the mornings.  Frequently does not have motivation to get out of bed.  When he does get up and gets going generally things are better.  He states his other issue is he feels "hungry all the time ".  This occurs even when he eats things like power bars with lots of protein.  He does have history of low testosterone.  He had severe COVID infection couple years ago.  Testosterone then.  He would like to get this rechecked and consider going back on this if this comes back low.  He rises this could have some effect on his mood and energy levels.  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   severe-right shin lower (EXC)   Atypical nevus 01/16/2013   moderate-right lower back (WS)   Atypical nevus 05/29/2013   moderate-mid back    Basal cell carcinoma 07/15/1999   right neck (EXC)   Basal cell carcinoma 08/29/1999   sclerosis- right neck (MOHS)   Basal cell carcinoma 02/15/2008   right shin-(CX35FU)   Basal cell carcinoma 08/13/2014   nod-right mid back (CX35FU), nod-right side nose (EXC)   Basal cell carcinoma 08/23/2014   nod-right side nose sup (MOHS)   Basal cell carcinoma 08/12/2015   sup/nod-left ear rim (CX35FU)   Basal cell carcinoma 01/22/2016   sup-right scapula (CX35FU)   Basal cell carcinoma 06/09/2017   sup/nod-left post shoulder (CX35FU)   Basal cell carcinoma 11/17/2018   sup/nod-right side of nose  (CX35FU)   Basal cell carcinoma 01/26/2019   nod-right side nose bridge, inf   BCC (basal cell carcinoma) 06/19/2019   below right ear-cx3 and Excision   Cancer ([place]) 2013   right shin   Celiac disease 01/11/2020   Chronic kidney disease    Cognitive impairment, mild, so stated 01/23/2014   Colon polyps    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   Hypertension    Migraines    Nodular infiltrative basal cell carcinoma (BCC) 10/28/2020   Right Submandibular area   Squamous cell carcinoma of skin 09/07/2018   in situ-right mid shin (CX35FU)    Past Surgical History:  Procedure Laterality Date   ABDOMINAL HERNIA REPAIR  2012   APPENDECTOMY  1974   left knee  2011   surgery   VARICOSE VEIN SURGERY      Family History  Problem Relation Age of Onset   Hypertension Mother    Heart disease Father    Cancer Maternal Uncle        prostate   Stroke Paternal Grandfather     Social History   Socioeconomic History   Marital status: Married    Spouse name: Not on file   Number of children: Not on file   Years of education: Not on file   Highest education level: 12th grade  Occupational History   Not on file  Tobacco  Use   Smoking status: Former    Packs/day: 2.00    Years: 30.00    Pack years: 60.00    Types: Cigarettes    Quit date: 09/09/1999    Years since quitting: 21.6   Smokeless tobacco: Never  Vaping Use   Vaping Use: Never used  Substance and Sexual Activity   Alcohol use: No    Alcohol/week: 0.0 standard drinks   Drug use: No   Sexual activity: Not on file  Other Topics Concern   Not on file  Social History Narrative   Right handed, caffeine 2 cups daily, FT -truck driver, Married, 2 kids, HS graduate   Social Determinants of Health   Financial Resource Strain: Low Risk    Difficulty of Paying Living Expenses: Not hard at all  Food Insecurity: No Food Insecurity   Worried About Charity fundraiser in the Last Year: Never true   Arboriculturist in the Last  Year: Never true  Transportation Needs: No Transportation Needs   Lack of Transportation (Medical): No   Lack of Transportation (Non-Medical): No  Physical Activity: Insufficiently Active   Days of Exercise per Week: 2 days   Minutes of Exercise per Session: 50 min  Stress: No Stress Concern Present   Feeling of Stress : Only a little  Social Connections: Unknown   Frequency of Communication with Friends and Family: More than three times a week   Frequency of Social Gatherings with Friends and Family: Patient refused   Attends Religious Services: Patient refused   Marine scientist or Organizations: No   Attends Music therapist: Not on file   Marital Status: Married  Human resources officer Violence: Not on file    Outpatient Medications Prior to Visit  Medication Sig Dispense Refill   acetaminophen (TYLENOL) 325 MG tablet Take 2 tablets (650 mg total) by mouth every 6 (six) hours as needed for mild pain or headache (fever >/= 101).     buPROPion (WELLBUTRIN XL) 300 MG 24 hr tablet Take 300 mg by mouth daily.     Calcium Carb-Cholecalciferol [phone] MG-UNIT TABS Take 1 tablet by mouth daily.      Cholecalciferol (VITAMIN D) 2000 units CAPS Take 2,000 Units by mouth daily.      diphenhydramine-acetaminophen (TYLENOL PM) 25-500 MG TABS tablet Take 1 tablet by mouth at bedtime as needed (sleep).     fexofenadine (ALLEGRA) 180 MG tablet Take 180 mg by mouth daily.     magnesium oxide (MAG-OX) 400 MG tablet Take 400 mg by mouth daily.     Prenatal Multivit-Min-Fe-FA (PRENATAL VITAMINS PO) Take 1 capsule by mouth daily.      Probiotic CAPS Take 1 capsule by mouth daily.      sildenafil (VIAGRA) 100 MG tablet Take 0.5-1 tablets (50-100 mg total) by mouth daily as needed for erectile dysfunction. 6 tablet 11   Testosterone 20.25 MG/ACT (1.62%) GEL USE 2 PUMPS ON EACH ARM DAILY 150 g 0   buPROPion (WELLBUTRIN XL) 150 MG 24 hr tablet TAKE 1 TABLET BY MOUTH EVERY DAY 90 tablet 2    sertraline (ZOLOFT) 100 MG tablet Take 1 tablet (100 mg total) by mouth daily. 90 tablet 2   No facility-administered medications prior to visit.    Allergies  Allergen Reactions   Gluten Meal Diarrhea    ROS Review of Systems  Constitutional:  Negative for unexpected weight change.  Respiratory:  Negative for shortness of breath.   Cardiovascular:  Negative for chest pain.  Psychiatric/Behavioral:  Positive for dysphoric mood. Negative for agitation, confusion, self-injury and suicidal ideas. The patient is not nervous/anxious.      Objective:    Physical Exam Constitutional:      Appearance: He is well-developed.  HENT:     Right Ear: External ear normal.     Left Ear: External ear normal.  Eyes:     Pupils: Pupils are equal, round, and reactive to light.  Neck:     Thyroid: No thyromegaly.  Cardiovascular:     Rate and Rhythm: Normal rate and regular rhythm.  Pulmonary:     Effort: Pulmonary effort is normal. No respiratory distress.     Breath sounds: Normal breath sounds. No wheezing or rales.  Musculoskeletal:     Cervical back: Neck supple.  Neurological:     Mental Status: He is alert and oriented to person, place, and time.  Psychiatric:        Mood and Affect: Mood normal.        Thought Content: Thought content normal.    BP 140/64 (BP Location: Left Arm, Patient Position: Sitting, Cuff Size: Normal)    Pulse 70    Temp 97.8 F (36.6 C) (Oral)    Wt 201 lb 3.2 oz (91.3 kg)    SpO2 99%    BMI 27.29 kg/m  Wt Readings from Last 3 Encounters:  04/21/21 201 lb 3.2 oz (91.3 kg)  04/09/21 200 lb (90.7 kg)  02/12/21 192 lb (87.1 kg)     Health Maintenance Due  Topic Date Due   Hepatitis C Screening  Never done   Zoster Vaccines- Shingrix (1 of 2) Never done   COVID-19 Vaccine (5 - Booster for Moderna series) 03/21/2021    There are no preventive care reminders to display for this patient.  Lab Results  Component Value Date   TSH 1.79 11/12/2016    Lab Results  Component Value Date   WBC 8.1 06/03/2020   HGB 14.5 06/03/2020   HCT 42.9 06/03/2020   MCV 85.9 06/03/2020   PLT 151.0 06/03/2020   Lab Results  Component Value Date   NA 139 02/05/2020   K 4.7 02/05/2020   CO2 26 02/05/2020   GLUCOSE 124 (H) 02/05/2020   BUN 35 (H) 02/05/2020   CREATININE 1.70 (H) 02/05/2020   BILITOT 0.5 02/05/2020   ALKPHOS 67 01/16/2020   AST 18 02/05/2020   ALT 30 02/05/2020   PROT 6.8 02/05/2020   ALBUMIN 2.5 (L) 01/16/2020   CALCIUM 9.4 02/05/2020   ANIONGAP 10 01/17/2020   GFR 48.48 (L) 11/07/2018   Lab Results  Component Value Date   CHOL 199 06/12/2015   Lab Results  Component Value Date   HDL 34.10 (L) 06/12/2015   No results found for: [facility] Lab Results  Component Value Date   TRIG 297 (H) 01/11/2020   Lab Results  Component Value Date   CHOLHDL 6 06/12/2015   Lab Results  Component Value Date   HGBA1C 5.5 06/03/2020      Assessment & Plan:   #1 history of recurrent depression.  Presents now predominantly with low motivation and some depressed mood.  Already on fairly high-dose sertraline 100 mg daily.  We discussed titrating his Wellbutrin to 300 mg daily and give feedback in 2 to 3 weeks  #2 low energy.  Difficult to sort out how much of this may be tied to #1.  We will check TSH and also repeat  testosterone level.  If testosterone low consider getting back on replacement.   Meds ordered this encounter  Medications   sertraline (ZOLOFT) 100 MG tablet    Sig: Take 1 tablet (100 mg total) by mouth daily.    Dispense:  90 tablet    Refill:  3    Follow-up: No follow-ups on file.    Carolann Littler, MD

## 2021-05-25 ENCOUNTER — Other Ambulatory Visit: Payer: Self-pay | Admitting: Family Medicine

## 2021-05-25 ENCOUNTER — Encounter: Payer: Self-pay | Admitting: Family Medicine

## 2021-05-26 ENCOUNTER — Other Ambulatory Visit: Payer: Self-pay

## 2021-05-26 DIAGNOSIS — F339 Major depressive disorder, recurrent, unspecified: Secondary | ICD-10-CM

## 2021-05-26 MED ORDER — BUPROPION HCL ER (XL) 300 MG PO TB24: 300.0000 mg | ORAL_TABLET | Freq: Every day | ORAL | 0 refills | Status: DC

## 2021-05-27 ENCOUNTER — Telehealth: Payer: Self-pay

## 2021-05-27 NOTE — Telephone Encounter (Signed)
He had green mucus with sinus pressure and congestion for about a week. He has no fever. He is drinking well.   05/26/2021 9:26:17 AM SEE PCP WITHIN 3 DAYS Oren Bracket  05/27/21 1459: Pt states he attempted to make an appt with PCP yesterday & was unable to see him, so he we to UC. He states he was prescribed Amoxicillin. Pt advised that if symptoms do not improve or worsen after abx to call & schedule appt with provider. Pt verb understanding.

## 2021-07-09 ENCOUNTER — Encounter: Payer: Self-pay | Admitting: Vascular Surgery

## 2021-07-09 ENCOUNTER — Ambulatory Visit: Payer: BC Managed Care – PPO | Admitting: Vascular Surgery

## 2021-07-09 VITALS — BP 129/80 | HR 92 | Temp 98.3°F | Resp 18 | Ht 72.0 in | Wt 203.0 lb

## 2021-07-09 DIAGNOSIS — I83812 Varicose veins of left lower extremities with pain: Secondary | ICD-10-CM | POA: Diagnosis not present

## 2021-07-09 NOTE — Progress Notes (Signed)
? ?ASSESSMENT & PLAN  ? ?CHRONIC VENOUS INSUFFICIENCY: This patient has advanced venous disease (CEAP C4c) and has persistent symptoms despite conservative measures including elevation, thigh-high compression stockings (20 to 30 mmHg), and exercise.  For this reason I think he would be a good candidate for laser ablation of the left great saphenous vein down to just below the knee.  The vein does become more superficial below the knee.  He has some large varicosities and is a candidate for greater than 20 stabs.  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 ? ?I have discussed the indications for endovenous laser ablation of the left GSV, that is to lower the pressure in the veins and potentially help relieve the symptoms from venous hypertension.  I have also discussed alternative options such as conservative treatment as described above. I have discussed the potential complications of the procedure, including bleeding, bruising, leg swelling, deep venous thrombosis (<1% risk), or failure of the vein to close <1% risk).  I have also explained that venous insufficiency is a chronic disease, and that the patient is at risk for recurrent varicose veins in the future.  All of the patient's questions were encouraged and answered. They are agreeable to proceed.  ? ?I have discussed with the patient the indications for stab phlebectomy.  I have explained to the patient that that will have small scars from the stab incisions.  I explained that the other risks include bruising, bleeding, and phlebitis.  ? ? ?REASON FOR CONSULT:   ? ?65-monthfollow-up of chronic venous insufficiency ? ?HPI:  ? ?Micheal HOMESis a 67y.o. male who I last saw on 04/09/2021 with painful varicose veins of the left lower extremity.  He had CEAP C4c venous disease (corona phlebectatica).  We discussed the  importance of leg elevation, had him fitted for thigh-high compression stockings with a gradient of 20 to 30 mmHg, and encouraged him to exercise.  He comes in for a follow-up visit.  Of note at the time of his last visit I looked at the left great saphenous vein myself with the SonoSite and the vein was markedly dilated throughout.  In some areas it was less than a centimeter from the skin.  I felt that we would likely cannulate the vein just below the knee. ? ?Since I saw the patient last he has been faithfully wearing his thigh-high compression stockings.  He states that when he is wearing his stockings he does not have significant symptoms but as soon as he takes them off his aching pain and heaviness returns immediately.  He is also been elevating his legs and trying to exercise.  ? ?Past Medical History:  ?Diagnosis Date  ? Allergy   ? Atypical nevus 07/14/2011  ? severe-right shin lower (EXC)  ? Atypical nevus 01/16/2013  ? moderate-right lower back (WS)  ? Atypical nevus 05/29/2013  ? moderate-mid back   ? Basal cell carcinoma 07/15/1999  ? right neck (EXC)  ? Basal cell carcinoma 08/29/1999  ? sclerosis- right neck (MOHS)  ? Basal cell carcinoma 02/15/2008  ? right shin-(CX35FU)  ? Basal cell carcinoma 08/13/2014  ? nod-right mid back (CX35FU), nod-right side nose (EXC)  ? Basal cell carcinoma 08/23/2014  ? nod-right side nose sup (MOHS)  ? Basal cell carcinoma 08/12/2015  ? sup/nod-left ear rim (CX35FU)  ? Basal cell carcinoma 01/22/2016  ?  sup-right scapula (CX35FU)  ? Basal cell carcinoma 06/09/2017  ? sup/nod-left post shoulder (CX35FU)  ? Basal cell carcinoma 11/17/2018  ? sup/nod-right side of nose (CX35FU)  ? Basal cell carcinoma 01/26/2019  ? nod-right side nose bridge, inf  ? BCC (basal cell carcinoma) 06/19/2019  ? below right ear-cx3 and Excision  ? Cancer [facility]) 2013  ? right shin  ? Celiac disease 01/11/2020  ? Chronic kidney disease   ? Cognitive impairment, mild, so stated 01/23/2014  ? Colon  polyps   ? 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 Hypertension   ? Migraines   ? Nodular infiltrative basal cell carcinoma (BCC) 10/28/2020  ? Right Submandibular area  ? Squamous cell carcinoma of skin 09/07/2018  ? in situ-right mid shin (CX35FU)  ? ? ?Family History  ?Problem Relation Age of Onset  ? Hypertension Mother   ? Heart disease Father   ? Cancer Maternal Uncle   ?     prostate  ? Stroke Paternal Grandfather   ? ? ?SOCIAL HISTORY: ?Social History  ? ?Tobacco Use  ? Smoking status: Former  ?  Packs/day: 2.00  ?  Years: 30.00  ?  Pack years: 60.00  ?  Types: Cigarettes  ?  Quit date: 09/09/1999  ?  Years since quitting: 21.8  ? Smokeless tobacco: Never  ?Substance Use Topics  ? Alcohol use: No  ?  Alcohol/week: 0.0 standard drinks  ? ? ?Allergies  ?Allergen Reactions  ? Gluten Meal Diarrhea  ? ? ?Current Outpatient Medications  ?Medication Sig Dispense Refill  ? acetaminophen (TYLENOL) 325 MG tablet Take 2 tablets (650 mg total) by mouth every 6 (six) hours as needed for mild pain or headache (fever >/= 101).    ? buPROPion (WELLBUTRIN XL) 300 MG 24 hr tablet Take 1 tablet (300 mg total) by mouth daily. 90 tablet 0  ? Calcium Carb-Cholecalciferol [phone] MG-UNIT TABS Take 1 tablet by mouth daily.     ? Cholecalciferol (VITAMIN D) 2000 units CAPS Take 2,000 Units by mouth daily.     ? diphenhydramine-acetaminophen (TYLENOL PM) 25-500 MG TABS tablet Take 1 tablet by mouth at bedtime as needed (sleep).    ? fexofenadine (ALLEGRA) 180 MG tablet Take 180 mg by mouth daily.    ? Prenatal Multivit-Min-Fe-FA (PRENATAL VITAMINS PO) Take 1 capsule by mouth daily.     ? sertraline (ZOLOFT) 100 MG tablet Take 1 tablet (100 mg total) by mouth daily. 90 tablet 3  ? magnesium oxide (MAG-OX) 400 MG tablet Take 400 mg by mouth daily. (Patient not taking: Reported on 07/09/2021)    ? Probiotic CAPS Take 1 capsule by mouth daily.  (Patient not taking: Reported on 07/09/2021)    ? sildenafil (VIAGRA) 100 MG tablet Take 0.5-1 tablets  (50-100 mg total) by mouth daily as needed for erectile dysfunction. (Patient not taking: Reported on 07/09/2021) 6 tablet 11  ? Testosterone 20.25 MG/ACT (1.62%) GEL USE 2 PUMPS ON EACH ARM DAILY (Patient not taking: Reported on 07/09/2021) 150 g 0  ? ?No current facility-administered medications for this visit.  ? ? ?REVIEW OF SYSTEMS:  ?[X]  denotes positive finding, [ ]  denotes negative finding ?Cardiac  Comments:  ?Chest pain or chest pressure:    ?Shortness of breath upon exertion:    ?Short of breath when lying flat:    ?Irregular heart rhythm:    ?    ?Vascular    ?Pain in calf, thigh, or hip brought on by ambulation:    ?  Pain in feet at night that wakes you up from your sleep:     ?Blood clot in your veins:    ?Leg swelling:     ?    ?Pulmonary    ?Oxygen at home:    ?Productive cough:     ?Wheezing:     ?    ?Neurologic    ?Sudden weakness in arms or legs:     ?Sudden numbness in arms or legs:     ?Sudden onset of difficulty speaking or slurred speech:    ?Temporary loss of vision in one eye:     ?Problems with dizziness:     ?    ?Gastrointestinal    ?Blood in stool:     ?Vomited blood:     ?    ?Genitourinary    ?Burning when urinating:     ?Blood in urine:    ?    ?Psychiatric    ?Major depression:     ?    ?Hematologic    ?Bleeding problems:    ?Problems with blood clotting too easily:    ?    ?Skin    ?Rashes or ulcers:    ?    ?Constitutional    ?Fever or chills:    ?- ? ?PHYSICAL EXAM:  ? ?Vitals:  ? 07/09/21 1301  ?BP: 129/80  ?Pulse: 92  ?Resp: 18  ?Temp: 98.3 ?F (36.8 ?C)  ?TempSrc: Temporal  ?SpO2: 95%  ?Weight: 203 lb (92.1 kg)  ?Height: 6' (1.829 m)  ? ?Body mass index is 27.53 kg/m?. ?GENERAL: The patient is a well-nourished male, in no acute distress. The vital signs are documented above. ?CARDIAC: There is a regular rate and rhythm.  ?VASCULAR: I do not detect carotid bruits. ?He has palpable posterior tibial pulses bilaterally. ?He has some large varicose veins in the calf and multiple  reticular and spider veins in addition to corona phlebectatica around the ankle. ? ? ?I did look at the left great saphenous vein myself with the SonoSite.  The vein is markedly dilated throughout.  It appears to be

## 2021-07-28 ENCOUNTER — Ambulatory Visit: Payer: BC Managed Care – PPO | Admitting: Family Medicine

## 2021-07-28 ENCOUNTER — Encounter: Payer: Self-pay | Admitting: Family Medicine

## 2021-07-28 VITALS — BP 128/82 | HR 65 | Temp 98.1°F | Ht 72.0 in | Wt 205.8 lb

## 2021-07-28 DIAGNOSIS — M7582 Other shoulder lesions, left shoulder: Secondary | ICD-10-CM | POA: Diagnosis not present

## 2021-07-28 DIAGNOSIS — M25512 Pain in left shoulder: Secondary | ICD-10-CM | POA: Diagnosis not present

## 2021-07-28 MED ORDER — METHYLPREDNISOLONE ACETATE 80 MG/ML IJ SUSP
80.0000 mg | Freq: Once | INTRAMUSCULAR | Status: AC
  Administered 2021-07-28: 80 mg via INTRAMUSCULAR

## 2021-07-28 NOTE — Progress Notes (Signed)
? ?Subjective:  ? ? Patient ID: Micheal Graham, male    DOB: [birth year], 66 y.o.   MRN: [id number] ? ?HPI ? ?Seen for recurrent left shoulder pain.  Onset several weeks ago.  No injury.  Similar flareup couple years ago which did improve following steroid injection.  He is requesting the same today.  No radiculitis symptoms.  Pain worse with abduction of left shoulder greater than around 90 degrees.  Occasional night pain.  No upper extremity numbness.  No clear weakness. ? ?Past Medical History:  ?Diagnosis Date  ? Allergy   ? Atypical nevus 07/14/2011  ? severe-right shin lower (EXC)  ? Atypical nevus 01/16/2013  ? moderate-right lower back (WS)  ? Atypical nevus 05/29/2013  ? moderate-mid back   ? Basal cell carcinoma 07/15/1999  ? right neck (EXC)  ? Basal cell carcinoma 08/29/1999  ? sclerosis- right neck (MOHS)  ? Basal cell carcinoma 02/15/2008  ? right shin-(CX35FU)  ? Basal cell carcinoma 08/13/2014  ? nod-right mid back (CX35FU), nod-right side nose (EXC)  ? Basal cell carcinoma 08/23/2014  ? nod-right side nose sup (MOHS)  ? Basal cell carcinoma 08/12/2015  ? sup/nod-left ear rim (CX35FU)  ? Basal cell carcinoma 01/22/2016  ? sup-right scapula (CX35FU)  ? Basal cell carcinoma 06/09/2017  ? sup/nod-left post shoulder (CX35FU)  ? Basal cell carcinoma 11/17/2018  ? sup/nod-right side of nose (CX35FU)  ? Basal cell carcinoma 01/26/2019  ? nod-right side nose bridge, inf  ? BCC (basal cell carcinoma) 06/19/2019  ? below right ear-cx3 and Excision  ? Cancer [facility]) 2013  ? right shin  ? Celiac disease 01/11/2020  ? Chronic kidney disease   ? Cognitive impairment, mild, so stated 01/23/2014  ? Colon polyps   ? 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 Hypertension   ? Migraines   ? Nodular infiltrative basal cell carcinoma (BCC) 10/28/2020  ? Right Submandibular area  ? Squamous cell carcinoma of skin 09/07/2018  ? in situ-right mid shin (CX35FU)  ? ?Past Surgical History:  ?Procedure Laterality Date  ? ABDOMINAL HERNIA  REPAIR  2012  ? APPENDECTOMY  1974  ? left knee  2011  ? surgery  ? VARICOSE VEIN SURGERY    ? ? reports that he quit smoking about 21 years ago. His smoking use included cigarettes. He has a 60.00 pack-year smoking history. He has never used smokeless tobacco. He reports that he does not drink alcohol and does not use drugs. ?family history includes Cancer in his maternal uncle; Heart disease in his father; Hypertension in his mother; Stroke in his paternal grandfather. ?Allergies  ?Allergen Reactions  ? Gluten Meal Diarrhea  ? ? ? ? ?Review of Systems  ?Musculoskeletal:  Negative for neck pain.  ?Neurological:  Negative for weakness and numbness.  ? ?   ?Objective:  ? Physical Exam ?Vitals reviewed.  ?Constitutional:   ?   Appearance: Normal appearance.  ?Cardiovascular:  ?   Rate and Rhythm: Normal rate and regular rhythm.  ?Pulmonary:  ?   Effort: Pulmonary effort is normal.  ?   Breath sounds: Normal breath sounds.  ?Musculoskeletal:  ?   Comments: Left shoulder no visible swelling.  Mild subacromial tenderness.  Pain with abduction greater than 90 degrees against resistance.  Minimal discomfort with internal rotation ?No erythema.  No warmth.  No acromioclavicular tenderness.  No biceps tenderness.  ?Neurological:  ?   Mental Status: He is alert.  ? ? ? ? ? ?   ?  Assessment & Plan:  ? ?Probable left rotator cuff tendinitis.  At risk for adhesive capsulitis.  Patient has benefited previously from injection. ? ? Discussed risks and benefits of corticosteroid injection and patient consented.  After prepping skin with betadine, injected 80 mg depomedrol and 2 cc of plain xylocaine with 25 gauge one and one half inch needle using posterior lateral approach and pt tolerated well. ?Try some icing as needed tonight for any discomfort ?Gentle range of motion as tolerated ?Touch base if not improving next couple weeks ?Consider physical therapy trial if not improved significantly in the next week ? ?[name]  MD ?[facility] ? ? ? ?

## 2021-08-04 ENCOUNTER — Encounter: Payer: Self-pay | Admitting: Vascular Surgery

## 2021-08-19 ENCOUNTER — Other Ambulatory Visit: Payer: Self-pay | Admitting: *Deleted

## 2021-08-19 DIAGNOSIS — I83812 Varicose veins of left lower extremities with pain: Secondary | ICD-10-CM

## 2021-08-20 ENCOUNTER — Other Ambulatory Visit: Payer: Self-pay | Admitting: Family Medicine

## 2021-08-20 DIAGNOSIS — F339 Major depressive disorder, recurrent, unspecified: Secondary | ICD-10-CM

## 2021-08-20 MED ORDER — BUPROPION HCL ER (XL) 300 MG PO TB24: 300.0000 mg | ORAL_TABLET | Freq: Every day | ORAL | 0 refills | Status: DC

## 2021-10-13 ENCOUNTER — Encounter: Payer: Self-pay | Admitting: Family Medicine

## 2021-10-13 ENCOUNTER — Ambulatory Visit: Payer: BC Managed Care – PPO | Admitting: Family Medicine

## 2021-10-13 VITALS — BP 130/80 | HR 72 | Temp 97.6°F | Ht 72.0 in | Wt 208.9 lb

## 2021-10-13 DIAGNOSIS — R030 Elevated blood-pressure reading, without diagnosis of hypertension: Secondary | ICD-10-CM | POA: Diagnosis not present

## 2021-10-13 NOTE — Progress Notes (Signed)
Established Patient Office Visit  Subjective   Patient ID: Micheal Graham, male    DOB: Sep 19, 1955  Age: 66 y.o. MRN: [id number]  Chief Complaint  Patient presents with   Follow-up   Hypertension    HPI   Seen with some recent elevated blood pressure readings.  He has had very stressful stretch with his wife having knee replacement and he states he was eating out a lot.  He feels like the fast food and increase sodium intake adversely impacted his blood pressure.  He had a couple readings few weeks ago 662H systolic.  Diastolic was relatively well controlled.  No headaches.  No dizziness.  No pitting edema.  He does have some chronic venous disease left lower extremity and he has upcoming procedure for that.  He has made some dietary changes with restricting sodium past couple weeks and home blood pressures have improved since then.  No regular alcohol use.  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   severe-right shin lower (EXC)   Atypical nevus 01/16/2013   moderate-right lower back (WS)   Atypical nevus 05/29/2013   moderate-mid back    Basal cell carcinoma 07/15/1999   right neck (EXC)   Basal cell carcinoma 08/29/1999   sclerosis- right neck (MOHS)   Basal cell carcinoma 02/15/2008   right shin-(CX35FU)   Basal cell carcinoma 08/13/2014   nod-right mid back (CX35FU), nod-right side nose (EXC)   Basal cell carcinoma 08/23/2014   nod-right side nose sup (MOHS)   Basal cell carcinoma 08/12/2015   sup/nod-left ear rim (CX35FU)   Basal cell carcinoma 01/22/2016   sup-right scapula (CX35FU)   Basal cell carcinoma 06/09/2017   sup/nod-left post shoulder (CX35FU)   Basal cell carcinoma 11/17/2018   sup/nod-right side of nose (CX35FU)   Basal cell carcinoma 01/26/2019   nod-right side nose bridge, inf   BCC (basal cell carcinoma) 06/19/2019   below right ear-cx3 and Excision   Cancer ([place]) 2013   right shin   Celiac disease 01/11/2020    Chronic kidney disease    Cognitive impairment, mild, so stated 01/23/2014   Colon polyps    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 His smoking use included cigarettes. He has a 60.00 pack-year smoking history. He has never used smokeless tobacco. He reports that he does not drink alcohol and does not use drugs. family history includes Cancer in his maternal uncle; Heart disease in his father; Hypertension in his mother; Stroke in his paternal grandfather. Allergies  Allergen Reactions   Gluten Meal Diarrhea    Review of Systems  Constitutional:  Negative for weight loss.  Cardiovascular:  Negative for chest pain.  Neurological:  Negative for dizziness and headaches.      Objective:     BP 130/80 (BP Location: Left Arm, Cuff Size: Normal)   Pulse 72   Temp 97.6 F (36.4 C) (Oral)   Ht 6' (1.829 m)   Wt 208 lb  14.4 oz (94.8 kg)   SpO2 98%   BMI 28.33 kg/m  BP Readings from Last 3 Encounters:  10/13/21 130/80  07/28/21 128/82  07/09/21 129/80   Wt Readings from Last 3 Encounters:  10/13/21 208 lb 14.4 oz (94.8 kg)  07/28/21 205 lb 12.8 oz (93.4 kg)  07/09/21 203 lb (92.1 kg)      Physical Exam Vitals reviewed.  Constitutional:      Appearance: Normal appearance.  Cardiovascular:     Rate and Rhythm: Normal rate and regular rhythm.     Heart sounds: No murmur heard.    No gallop.  Pulmonary:     Effort: Pulmonary effort is normal.     Breath sounds: Normal breath sounds.  Musculoskeletal:     Right lower leg: No edema.     Left lower leg: No edema.   Neurological:     Mental Status: He is alert.      No results found for any visits on 10/13/21.    The ASCVD Risk score (Arnett DK, et al., 2019) failed to calculate for the following reasons:   Cannot find a previous HDL lab   Cannot find a previous total cholesterol lab    Assessment & Plan:   Problem List Items Addressed This Visit   None Visit Diagnoses     Elevated blood pressure reading    -  Primary     Patient had recent home elevated blood pressure readings which have now improved.  Repeat reading today after rest 130/80.  Likely exacerbated by recent increase sodium intake and increased stressors.  -Continue monitoring -Handout on DASH diet given -Try to keep daily sodium intake less than 2500 mg -Discussed the value of regular aerobic exercise such as walking -Be in touch if home readings consistently over 347 systolic  No follow-ups on file.    Carolann Littler, MD

## 2021-10-13 NOTE — Patient Instructions (Signed)
Continue to monitor blood pressure and be in touch if consistently > 140/90

## 2021-10-27 ENCOUNTER — Ambulatory Visit: Payer: BC Managed Care – PPO | Admitting: Dermatology

## 2021-10-27 ENCOUNTER — Encounter: Payer: Self-pay | Admitting: Dermatology

## 2021-10-27 DIAGNOSIS — C44219 Basal cell carcinoma of skin of left ear and external auricular canal: Secondary | ICD-10-CM

## 2021-10-27 DIAGNOSIS — D485 Neoplasm of uncertain behavior of skin: Secondary | ICD-10-CM

## 2021-10-27 NOTE — Patient Instructions (Signed)

## 2021-10-31 ENCOUNTER — Telehealth: Payer: Self-pay

## 2021-10-31 NOTE — Telephone Encounter (Signed)
[facility]  Release:  [phone]

## 2021-10-31 NOTE — Telephone Encounter (Signed)
-----   Message from [name], MD sent at 10/30/2021  8:54 PM EDT ----- Schedule [facility]

## 2021-11-05 ENCOUNTER — Ambulatory Visit (INDEPENDENT_AMBULATORY_CARE_PROVIDER_SITE_OTHER): Payer: BC Managed Care – PPO | Admitting: Vascular Surgery

## 2021-11-05 ENCOUNTER — Encounter: Payer: Self-pay | Admitting: Vascular Surgery

## 2021-11-05 VITALS — BP 134/79 | Temp 98.5°F | Resp 16 | Ht 72.0 in | Wt 206.0 lb

## 2021-11-05 DIAGNOSIS — I83812 Varicose veins of left lower extremities with pain: Secondary | ICD-10-CM

## 2021-11-05 DIAGNOSIS — I872 Venous insufficiency (chronic) (peripheral): Secondary | ICD-10-CM | POA: Diagnosis not present

## 2021-11-05 HISTORY — PX: ENDOVENOUS ABLATION SAPHENOUS VEIN W/ LASER: SUR449

## 2021-11-05 NOTE — Progress Notes (Signed)
     Laser Ablation Procedure     Date: 11/05/2021   Micheal Graham DOB:12-22-1955  Consent signed: Yes      Surgeon: Gae Gallop MD   Procedure: Laser Ablation: left Greater Saphenous Vein  BP 134/79 (BP Location: Left Arm, Patient Position: Sitting, Cuff Size: Normal)   Temp 98.5 F (36.9 C) (Temporal)   Resp 16   Ht 6' (1.829 m)   Wt 206 lb (93.4 kg)   SpO2 97%   BMI 27.94 kg/m   Tumescent Anesthesia: 600 cc 0.9% NaCl with 50 cc Lidocaine HCL 1%  and 15 cc 8.4% NaHCO3  Local Anesthesia: 12 cc Lidocaine HCL and NaHCO3 (ratio 2:1)  7 watts continuous mode     Total energy: 1855 Joules     Total time: 265 seconds  Treatment Length  38 cm   Laser Fiber Ref. #  [id number]    Lot #  [id number]   Stab Phlebectomy: >20 Sites: Calf  Patient tolerated procedure well  Notes: All staff members wore facial masks.    Description of Procedure:  After marking the course of the secondary varicosities, the patient was placed on the operating table in the supine position, and the left leg was prepped and draped in sterile fashion.   Local anesthetic was administered and under ultrasound guidance the saphenous vein was accessed with a micro needle and guide wire; then the mirco puncture sheath was placed.  A guide wire was inserted saphenofemoral junction , followed by a 5 french sheath.  The position of the sheath and then the laser fiber below the junction was confirmed using the ultrasound.  Tumescent anesthesia was administered along the course of the saphenous vein using ultrasound guidance. The patient was placed in Trendelenburg position and protective laser glasses were placed on patient and staff, and the laser was fired at 7 watts continuous mode for a total of 1855 joules.   For stab phlebectomies, local anesthetic was administered at the previously marked varicosities, and tumescent anesthesia was administered around the vessels.  Greater than 20 stab wounds were made using the  tip of an 11 blade. And using the vein hook, the phlebectomies were performed using a hemostat to avulse the varicosities.  Adequate hemostasis was achieved.     Steri strips were applied to the stab wounds and ABD pads and thigh high compression stockings were applied.  Ace wrap bandages were applied over the phlebectomy sites and at the top of the saphenofemoral junction. Blood loss was less than 15 cc.  Discharge instructions reviewed with patient and hardcopy of discharge instructions given to patient to take home. The patient ambulated out of the operating room having tolerated the procedure well.

## 2021-11-05 NOTE — Progress Notes (Signed)
Patient name: Micheal Graham MRN: [id number] DOB: 1955-07-26 Sex: male  REASON FOR VISIT: For laser ablation of the left great saphenous vein and greater than 20 stabs  HPI: Micheal Graham is a 66 y.o. male who I last saw on 07/09/2021.  He has advanced venous insufficiency ( CEAP C4c).  He was having significant symptoms from his varicose veins and failed conservative treatment.  This reason I felt he was a candidate for laser ablation of the left great saphenous vein and greater than 20 stab phlebectomies.  I felt that I would likely cannulate the vein just above the knee.  Current Outpatient Medications  Medication Sig Dispense Refill   acetaminophen (TYLENOL) 325 MG tablet Take 2 tablets (650 mg total) by mouth every 6 (six) hours as needed for mild pain or headache (fever >/= 101).     buPROPion (WELLBUTRIN XL) 300 MG 24 hr tablet Take 1 tablet (300 mg total) by mouth daily. 90 tablet 0   Calcium Carb-Cholecalciferol [id number] MG-UNIT TABS Take 1 tablet by mouth daily.      Cholecalciferol (VITAMIN D) 2000 units CAPS Take 2,000 Units by mouth daily.      diphenhydramine-acetaminophen (TYLENOL PM) 25-500 MG TABS tablet Take 1 tablet by mouth at bedtime as needed (sleep).     fexofenadine (ALLEGRA) 180 MG tablet Take 180 mg by mouth daily.     Probiotic CAPS Take 1 capsule by mouth daily.     sertraline (ZOLOFT) 100 MG tablet Take 1 tablet (100 mg total) by mouth daily. 90 tablet 3   No current facility-administered medications for this visit.    PHYSICAL EXAM: Vitals:   11/05/21 1050  BP: 134/79  Resp: 16  Temp: 98.5 F (36.9 C)  TempSrc: Temporal  SpO2: 97%  Weight: 206 lb (93.4 kg)  Height: 6' (1.829 m)    PROCEDURE: Laser ablation left great saphenous vein greater than 20 stabs  TECHNIQUE: The patient was taken to the exam room and the dilated varicose veins were marked with the patient standing.  The patient was then placed supine.  I elected to cannulate the left great  saphenous vein just below the knee based on the SonoSite.  The left lower extremity was prepped and draped in usual sterile fashion.  Under ultrasound guidance, after the skin was anesthetized, I cannulated the left great saphenous vein just below the knee with a micropuncture needle and a micropuncture sheath was introduced over the wire.  I then advanced the J-wire to just below the saphenofemoral junction.  The dilator and sheath were advanced over the wire and the wire and dilator removed.  I position the laser fiber at the end of the sheath and the sheath was retracted.  I position this approximately 2.5 cm distal to the saphenofemoral junction.  Tumescent anesthesia was then administered circumferentially around the vein.  Patient was placed in Trendelenburg.  Laser ablation was performed of the left great saphenous vein from 2-1/2 cm distal to the saphenofemoral junction to the proximal calf.  50 J/cm was used at 79 W.  Next attention was turned to stab phlebectomies.  Tumescent anesthesia was used to anesthetize all the marked areas.  Approximately 25 small stab incisions were made and the vein was "brought above the skin and then grasped with a hemostat.  It was removed bluntly.  Pressure was held.  Steri-Strips were applied.  A pressure dressing was applied.  The patient tolerated the procedure well.  He will return in  1 week for follow-up duplex.  Deitra Mayo [facility] [phone]

## 2021-11-11 ENCOUNTER — Encounter: Payer: Self-pay | Admitting: Family Medicine

## 2021-11-11 NOTE — Telephone Encounter (Signed)
Noted.  Consider setting up follow-up within a couple months after starting back medication

## 2021-11-12 ENCOUNTER — Encounter: Payer: Self-pay | Admitting: Vascular Surgery

## 2021-11-12 ENCOUNTER — Other Ambulatory Visit: Payer: Self-pay

## 2021-11-12 ENCOUNTER — Ambulatory Visit (INDEPENDENT_AMBULATORY_CARE_PROVIDER_SITE_OTHER): Payer: BC Managed Care – PPO | Admitting: Vascular Surgery

## 2021-11-12 ENCOUNTER — Ambulatory Visit (HOSPITAL_COMMUNITY)
Admission: RE | Admit: 2021-11-12 | Discharge: 2021-11-12 | Disposition: A | Discharge status: Home / Self Care | Payer: BC Managed Care – PPO | Source: Ambulatory Visit | Attending: Vascular Surgery | Admitting: Vascular Surgery

## 2021-11-12 VITALS — BP 135/85 | HR 73 | Temp 98.8°F | Resp 18

## 2021-11-12 DIAGNOSIS — I83812 Varicose veins of left lower extremities with pain: Secondary | ICD-10-CM | POA: Insufficient documentation

## 2021-11-12 DIAGNOSIS — I872 Venous insufficiency (chronic) (peripheral): Secondary | ICD-10-CM

## 2021-11-12 DIAGNOSIS — I10 Essential (primary) hypertension: Secondary | ICD-10-CM

## 2021-11-12 MED ORDER — BENAZEPRIL HCL 10 MG PO TABS: 10.0000 mg | ORAL_TABLET | Freq: Every day | ORAL | 3 refills | Status: DC

## 2021-11-12 NOTE — Progress Notes (Signed)
   Patient name: Micheal Graham MRN: [id number] DOB: Nov 27, 1955 Sex: male  REASON FOR VISIT: Follow-up after laser ablation of the left great saphenous vein and greater than 20 stabs  HPI: Micheal Graham is a 66 y.o. male who has advanced venous insufficiency ( CEAP C4c).  He was having significant symptoms from his varicose veins and failed conservative treatment.  This reason I felt he was a candidate for laser ablation of the left great saphenous vein and greater than 20 stab phlebectomies.  On 11/05/2021 he underwent laser ablation of the left great saphenous vein down to the proximal calf with greater than 20 stabs.  He comes in for follow-up visit.  Current Outpatient Medications  Medication Sig Dispense Refill   acetaminophen (TYLENOL) 325 MG tablet Take 2 tablets (650 mg total) by mouth every 6 (six) hours as needed for mild pain or headache (fever >/= 101).     benazepril (LOTENSIN) 10 MG tablet Take 1 tablet (10 mg total) by mouth daily. 90 tablet 3   buPROPion (WELLBUTRIN XL) 300 MG 24 hr tablet Take 1 tablet (300 mg total) by mouth daily. 90 tablet 0   Calcium Carb-Cholecalciferol [phone] MG-UNIT TABS Take 1 tablet by mouth daily.      Cholecalciferol (VITAMIN D) 2000 units CAPS Take 2,000 Units by mouth daily.      diphenhydramine-acetaminophen (TYLENOL PM) 25-500 MG TABS tablet Take 1 tablet by mouth at bedtime as needed (sleep).     fexofenadine (ALLEGRA) 180 MG tablet Take 180 mg by mouth daily.     Probiotic CAPS Take 1 capsule by mouth daily.     sertraline (ZOLOFT) 100 MG tablet Take 1 tablet (100 mg total) by mouth daily. 90 tablet 3   No current facility-administered medications for this visit.   REVIEW OF SYSTEMS: Valu.Nieves ] denotes positive finding; [  ] denotes negative finding  CARDIOVASCULAR:  [ ]  chest pain   [ ]  dyspnea on exertion  [ ]  leg swelling  CONSTITUTIONAL:  [ ]  fever   [ ]  chills  PHYSICAL EXAM: Vitals:   11/12/21 1003  BP: 135/85  Pulse: 73  Resp: 18  Temp:  98.8 F (37.1 C)  TempSrc: Temporal  SpO2: 97%   GENERAL: The patient is a well-nourished male, in no acute distress. The vital signs are documented above. CARDIOVASCULAR: There is a regular rate and rhythm. PULMONARY: There is good air exchange bilaterally without wheezing or rales. VASCULAR: He has minimal bruising on the left thigh.  His Steri-Strips are intact.  DATA:  VENOUS DUPLEX: I have independently interpreted his venous duplex scan today.  There is no evidence of DVT in the left leg.  The left great saphenous vein is successfully closed from the proximal calf to within 8 mm of the saphenofemoral junction.  MEDICAL ISSUES:  S/P LASER ABLATION LEFT GREAT SAPHENOUS VEIN: This patient underwent laser ablation of left great saphenous vein with greater than 20 stabs.  He comes in for 2-week follow-up visit.  Patient is doing well without any specific complaints.  He has worn his thigh-high stockings for 2 weeks.  I encouraged him to continue to elevate his legs daily.  We also discussed the importance of exercise.  I will see him back as needed.  Deitra Mayo [facility] [phone]

## 2021-11-14 NOTE — Telephone Encounter (Signed)
Noted.  That seems reasonable.

## 2021-11-15 ENCOUNTER — Encounter: Payer: Self-pay | Admitting: Dermatology

## 2021-11-15 NOTE — Progress Notes (Signed)
   Follow-Up Visit   Subjective  Micheal Graham is a 66 y.o. male who presents for the following: Skin Problem (Lesion behind ear x 2 months- very sore).  New sore spot on back of left ear Location:  Duration:  Quality:  Associated Signs/Symptoms: Modifying Factors:  Severity:  Timing: Context:   Objective  Well appearing patient in no apparent distress; mood and affect are within normal limits. Left Mid Helix Focally eroded pearly 6 mm crust         A focused examination was performed including the head and neck.. Relevant physical exam findings are noted in the Assessment and Plan.   Assessment & Plan    Basal cell carcinoma of skin of left ear and external auricular canal Left Mid Helix  Skin / nail biopsy Type of biopsy: tangential   Informed consent: discussed and consent obtained   Timeout: patient name, date of birth, surgical site, and procedure verified   Anesthesia: the lesion was anesthetized in a standard fashion   Anesthetic:  1% lidocaine w/ epinephrine 1-100,000 local infiltration Instrument used: flexible razor blade   Hemostasis achieved with: ferric subsulfate   Outcome: patient tolerated procedure well   Post-procedure details: wound care instructions given    Destruction of lesion Complexity: simple   Destruction method: electrodesiccation and curettage   Informed consent: discussed and consent obtained   Timeout:  patient name, date of birth, surgical site, and procedure verified Anesthesia: the lesion was anesthetized in a standard fashion   Anesthetic:  1% lidocaine w/ epinephrine 1-100,000 local infiltration Curettage performed in three different directions: Yes   Curettage cycles:  3 Lesion length (cm):  1 Lesion width (cm):  1 Margin per side (cm):  0 Final wound size (cm):  1 Hemostasis achieved with:  aluminum chloride Outcome: patient tolerated procedure well with no complications   Post-procedure details: wound care  instructions given    Specimen 1 - Surgical pathology Differential Diagnosis: scc vs bcc  Check Margins: No  After shave biopsy the base was treated with curettage plus cautery showing some deep and some white extension.  If this recurs will refer for Mohs surgery.      I, [name], MD, have reviewed all documentation for this visit.  The documentation on 11/15/21 for the exam, diagnosis, procedures, and orders are all accurate and complete.

## 2021-11-18 ENCOUNTER — Other Ambulatory Visit: Payer: Self-pay | Admitting: Family Medicine

## 2021-11-18 DIAGNOSIS — F339 Major depressive disorder, recurrent, unspecified: Secondary | ICD-10-CM

## 2021-11-19 ENCOUNTER — Encounter: Payer: BC Managed Care – PPO | Admitting: Vascular Surgery

## 2021-11-19 ENCOUNTER — Encounter (HOSPITAL_COMMUNITY): Payer: BC Managed Care – PPO

## 2021-12-10 ENCOUNTER — Other Ambulatory Visit: Payer: Self-pay | Admitting: Family Medicine

## 2021-12-17 IMAGING — DX DG CHEST 2V
2 series · 2 of 2 positions shown · non-contrast
Comparison: August 28, 2017

CLINICAL DATA: Cough and chest tightness.  Fever.

EXAM:
CHEST - 2 VIEW

[chest pa]
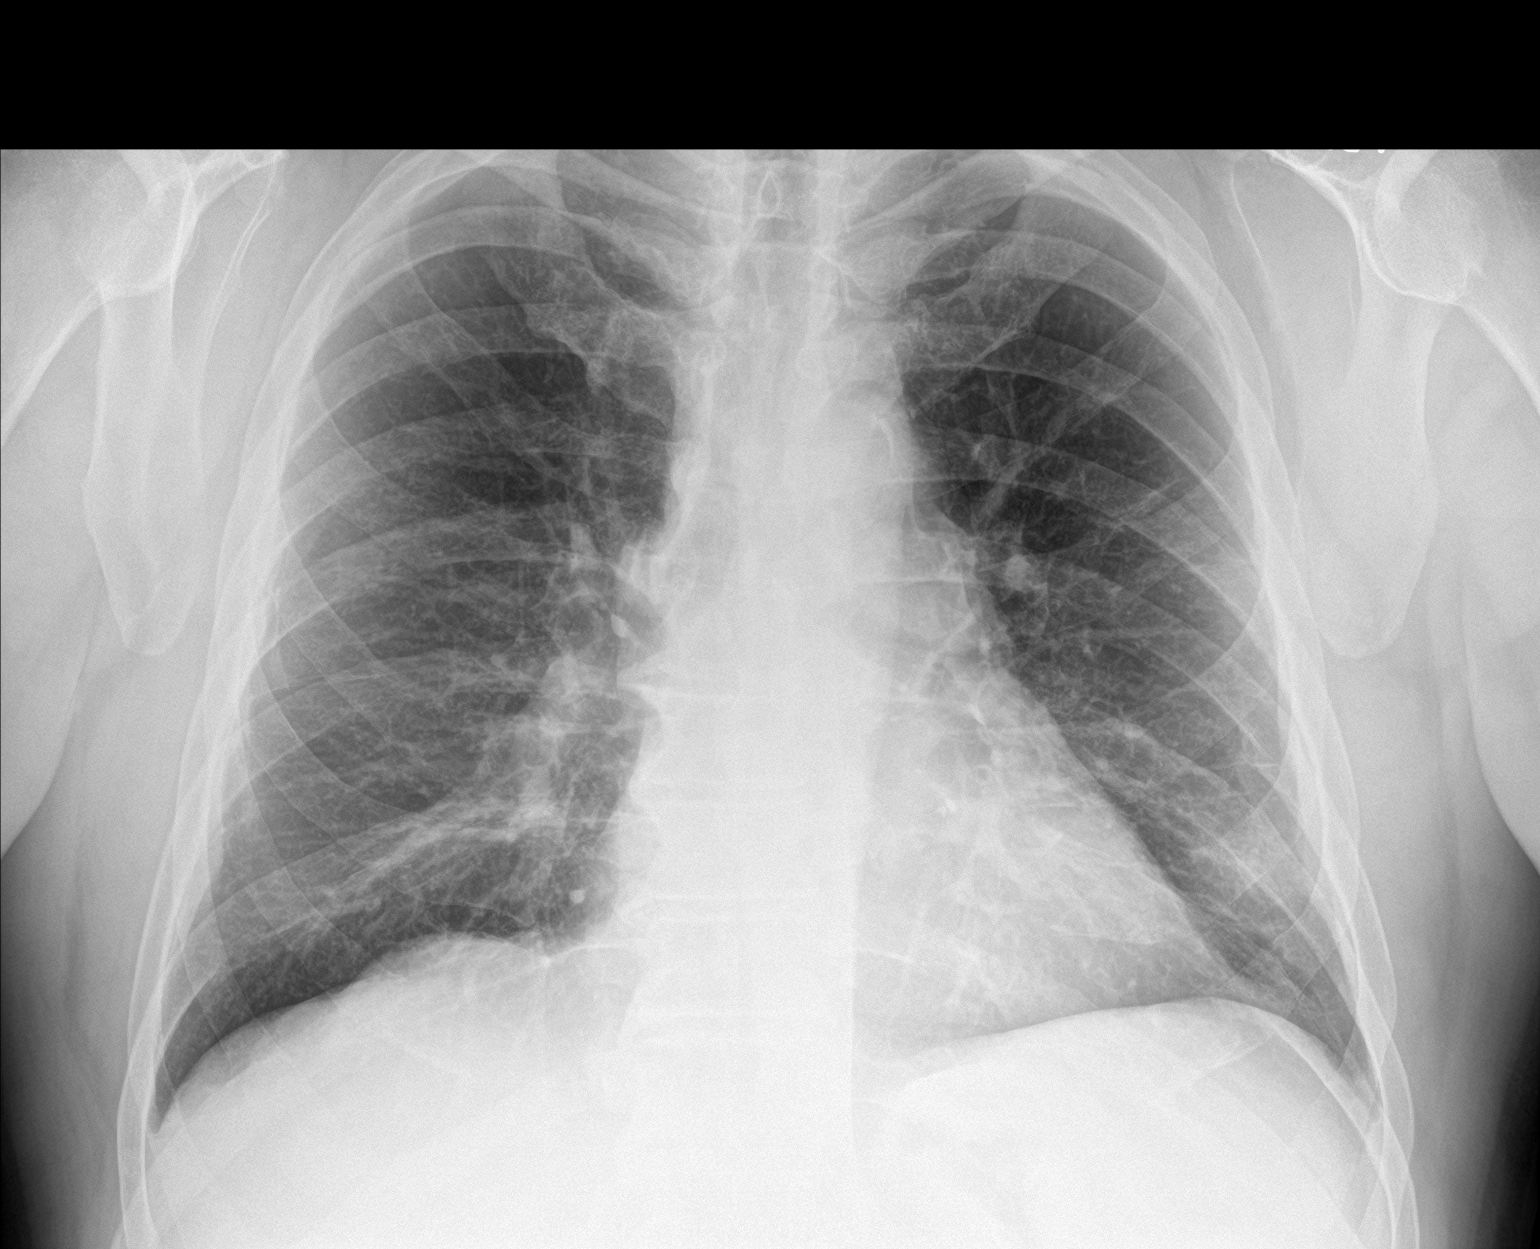

[chest lat]
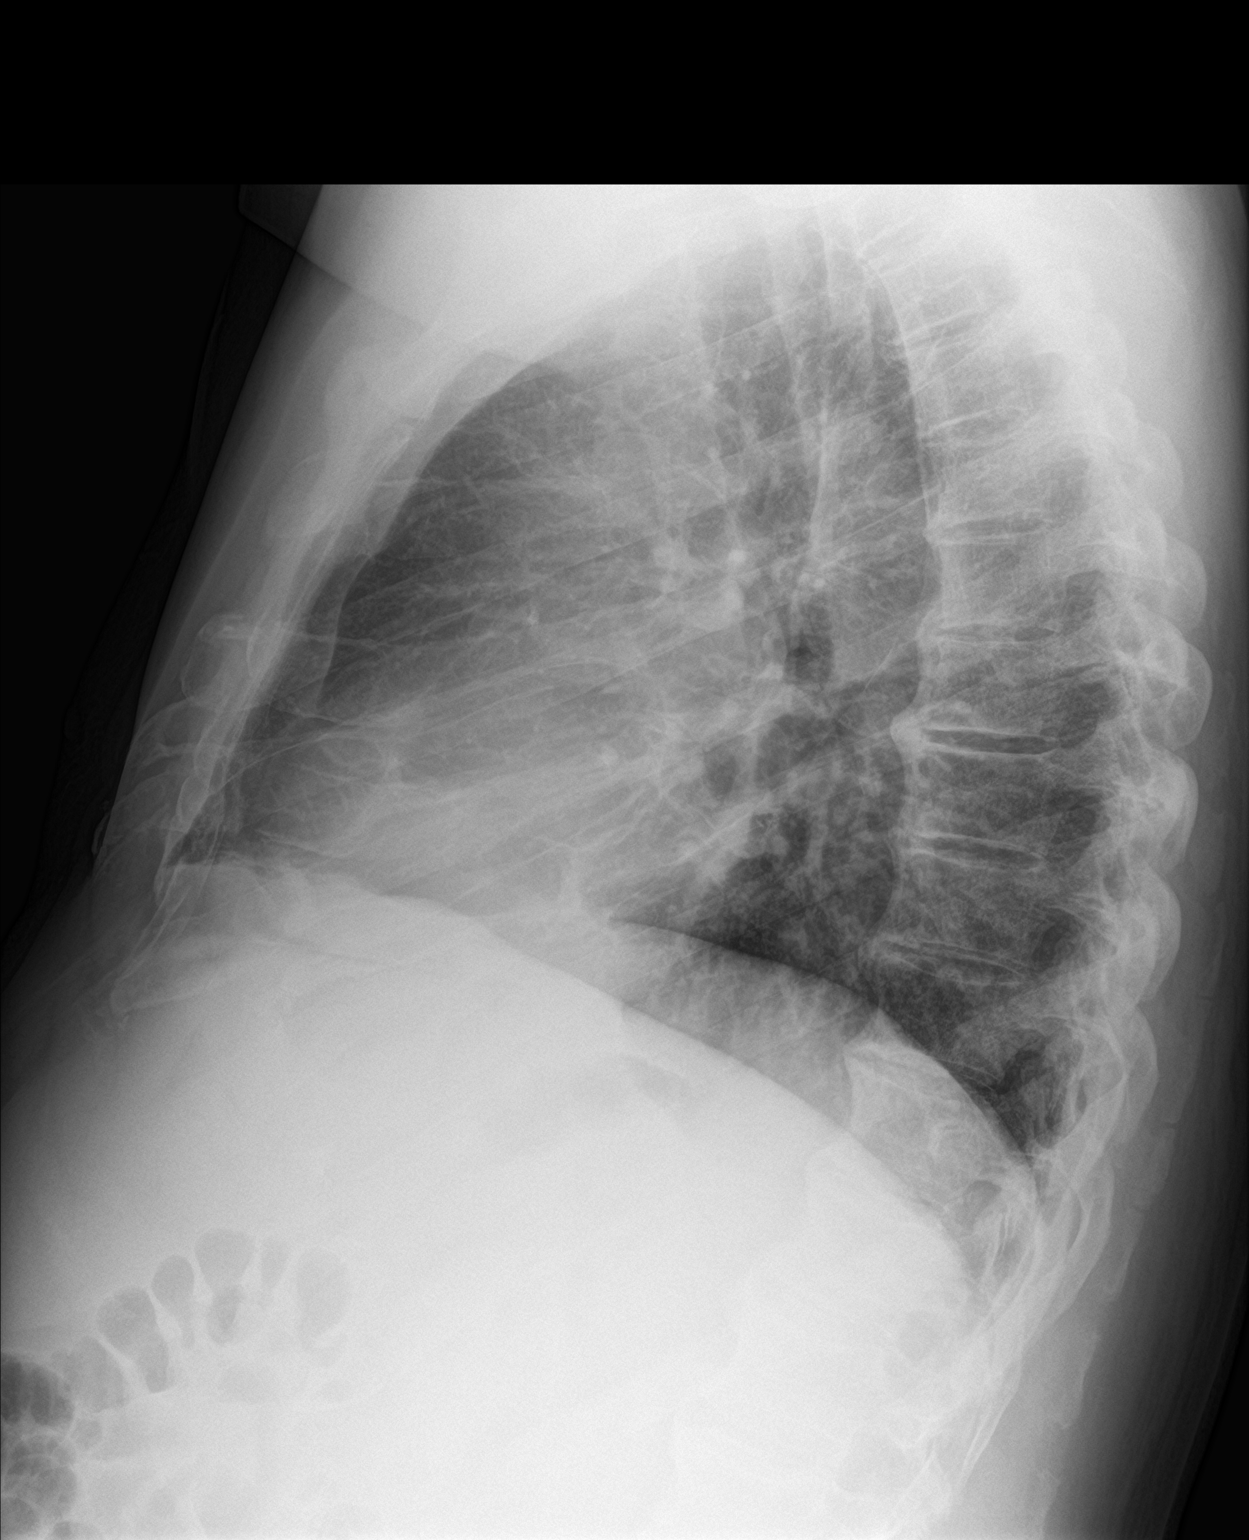

[2 of 2 positions shown; findings below may reference images not displayed]

FINDINGS: There is slight atelectatic change in the left mid and lower lung
regions. There is no edema or airspace opacity. Heart size and
pulmonary vascularity are normal. No adenopathy. There is aortic
atherosclerosis. There is degenerative change in the thoracic
spine.
IMPRESSION: Slight atelectatic change left mid and lower lung regions. No edema
or airspace opacity. Heart size normal.

Aortic Atherosclerosis (YOFR2-XV0.0).

## 2021-12-23 IMAGING — DX DG CHEST 1V PORT
1 series · 1 of 1 positions shown · non-contrast
Comparison: Chest radiograph 01/10/2020.

CLINICAL DATA: Pneumonia due to RLV5I-01.

EXAM:
PORTABLE CHEST 1 VIEW

[chest ap]
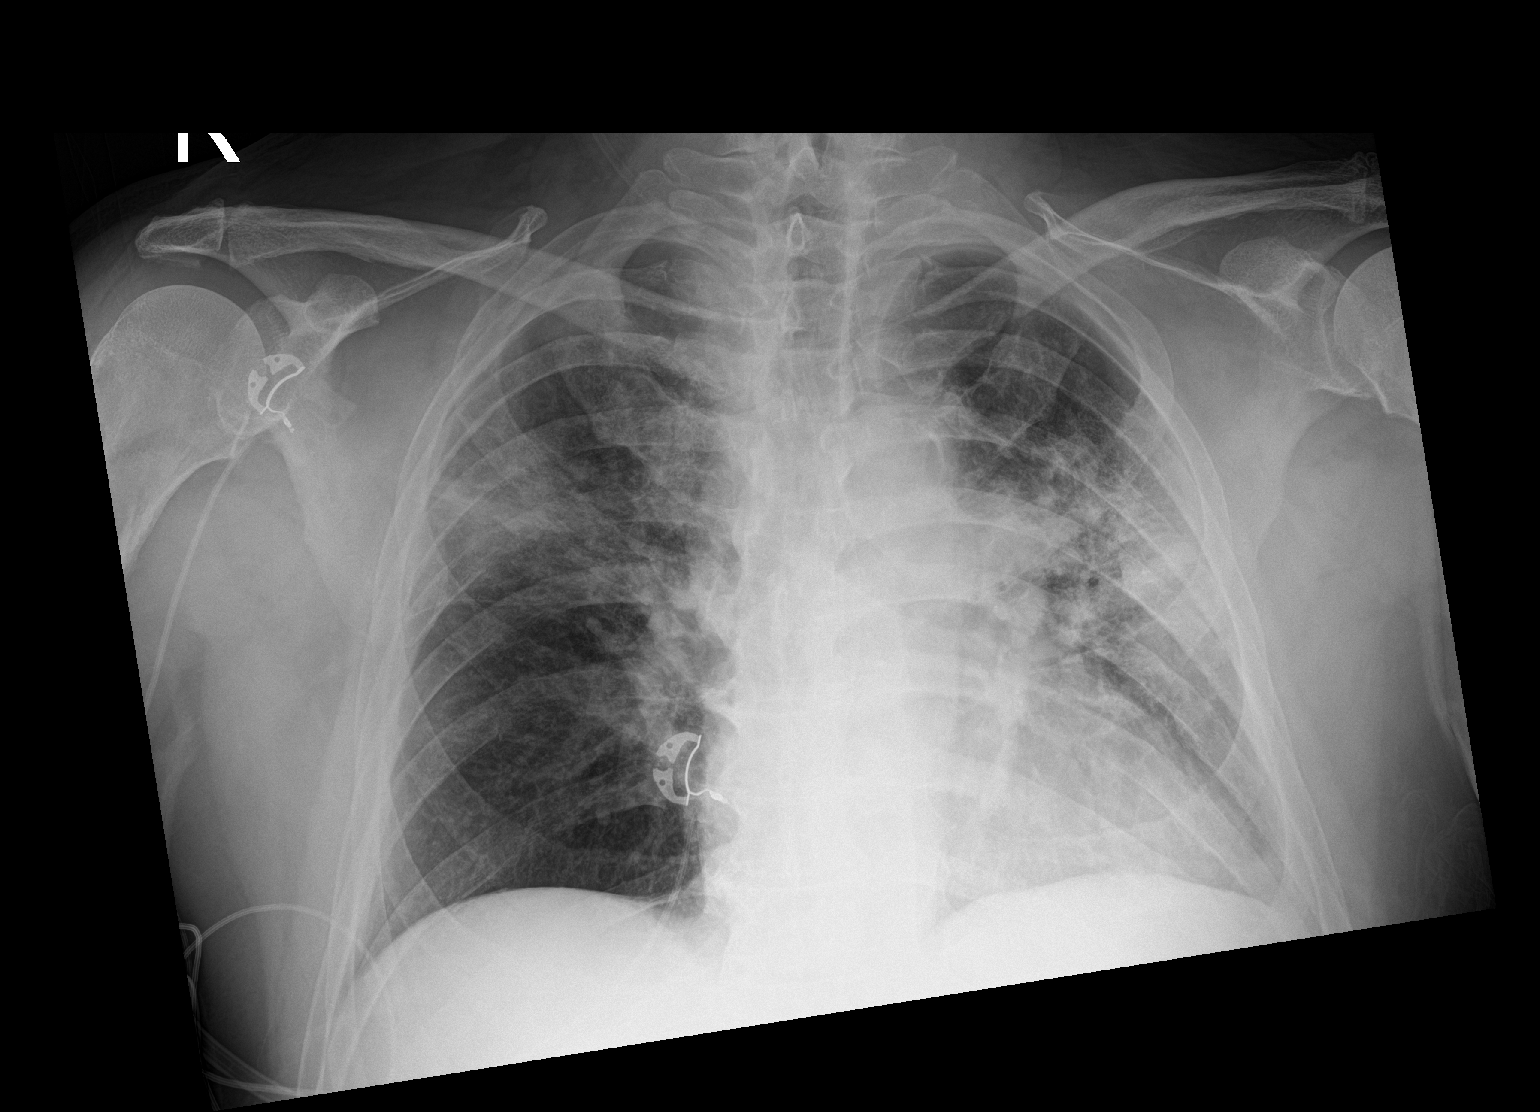

[1 of 1 positions shown; findings below may reference images not displayed]

FINDINGS: Stable cardiac and mediastinal contours. Interval worsening of left
mid lower lung and right mid lung patchy areas of consolidation. No
pleural effusion or pneumothorax. Thoracic spine degenerative
changes.
IMPRESSION: Interval worsening bilateral mid and lower lung patchy areas of
consolidation.

## 2022-01-02 ENCOUNTER — Encounter: Payer: Self-pay | Admitting: Family Medicine

## 2022-01-20 ENCOUNTER — Other Ambulatory Visit: Payer: Self-pay | Admitting: Family Medicine

## 2022-01-20 DIAGNOSIS — F339 Major depressive disorder, recurrent, unspecified: Secondary | ICD-10-CM

## 2022-01-21 MED ORDER — BUPROPION HCL ER (XL) 300 MG PO TB24: 300.0000 mg | ORAL_TABLET | Freq: Every day | ORAL | 0 refills | Status: DC

## 2022-02-09 ENCOUNTER — Encounter: Payer: Self-pay | Admitting: Family Medicine

## 2022-04-13 ENCOUNTER — Ambulatory Visit (INDEPENDENT_AMBULATORY_CARE_PROVIDER_SITE_OTHER): Payer: BC Managed Care – PPO

## 2022-04-13 ENCOUNTER — Ambulatory Visit: Payer: BC Managed Care – PPO | Admitting: Family Medicine

## 2022-04-13 ENCOUNTER — Encounter: Payer: Self-pay | Admitting: Family Medicine

## 2022-04-13 VITALS — BP 132/78 | HR 74 | Temp 97.7°F | Ht 72.0 in | Wt 222.7 lb

## 2022-04-13 DIAGNOSIS — M25522 Pain in left elbow: Secondary | ICD-10-CM | POA: Diagnosis not present

## 2022-04-13 NOTE — Patient Instructions (Signed)
Consider trial of Voltaren/Diclofenac get - apply to left elbow region 3- 4 times daily.

## 2022-04-13 NOTE — Progress Notes (Signed)
Established Patient Office Visit  Subjective   Patient ID: Micheal Graham, male    DOB: December 07, 1955  Age: 67 y.o. MRN: [id number]  Chief Complaint  Patient presents with   Shoulder Pain    Patient complains of shoulder pain, x3 months   Elbow Pain    Patient complains of elbow pain, x3 months    HPI   Micheal Graham is seen with left elbow pain past couple months.  He is right-hand dominant.  Denies any injury.  He had some shoulder issues in the past and does have occasional left shoulder pain with abduction but most of his pain is left elbow.  Has not noted any swelling or erythema.  Location is anterior elbow.  He has pain sometimes with certain positions and usually when he moves his elbow gets relief.  No pain with gripping.  No cervical radiculitis pains.  Sleeping okay.  He has not tried anything by mouth or topically for the left elbow pain.  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   severe-right shin lower (EXC)   Atypical nevus 01/16/2013   moderate-right lower back (WS)   Atypical nevus 05/29/2013   moderate-mid back    Basal cell carcinoma 07/15/1999   right neck (EXC)   Basal cell carcinoma 08/29/1999   sclerosis- right neck (MOHS)   Basal cell carcinoma 02/15/2008   right shin-(CX35FU)   Basal cell carcinoma 08/13/2014   nod-right mid back (CX35FU), nod-right side nose (EXC)   Basal cell carcinoma 08/23/2014   nod-right side nose sup (MOHS)   Basal cell carcinoma 08/12/2015   sup/nod-left ear rim (CX35FU)   Basal cell carcinoma 01/22/2016   sup-right scapula (CX35FU)   Basal cell carcinoma 06/09/2017   sup/nod-left post shoulder (CX35FU)   Basal cell carcinoma 11/17/2018   sup/nod-right side of nose (CX35FU)   Basal cell carcinoma 01/26/2019   nod-right side nose bridge, inf   BCC (basal cell carcinoma) 06/19/2019   below right ear-cx3 and Excision   Cancer ([place]) 2013   right shin   Celiac disease 01/11/2020   Chronic  kidney disease    Cognitive impairment, mild, so stated 01/23/2014   Colon polyps    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   Hypertension    Migraines    Nodular infiltrative basal cell carcinoma (BCC) 10/28/2020   Right Submandibular area   Squamous cell carcinoma of skin 09/07/2018   in situ-right mid shin (CX35FU)   Past Surgical History:  Procedure Laterality Date   ABDOMINAL HERNIA REPAIR  04/06/2010   APPENDECTOMY  04/06/1972   ENDOVENOUS ABLATION SAPHENOUS VEIN W/ LASER Left 11/05/2021   endovenous laser ablation left greater saphenous vein and stab phlebectomy > 20 incisions left leg by Gae Gallop MD   left knee  04/06/2009   surgery   VARICOSE VEIN SURGERY      reports that he quit smoking about 22 years ago. His smoking use included cigarettes. He has a 60.00 pack-year smoking history. He has never used smokeless tobacco. He reports that he does not drink alcohol and does not use drugs. family history includes Cancer in his maternal uncle; Heart disease in his father; Hypertension in his mother; Stroke in his paternal grandfather. Allergies  Allergen Reactions   Gluten Meal Diarrhea     Review of Systems  Musculoskeletal:  Negative for neck pain.  Neurological:  Negative for tingling and weakness.      Objective:  BP 132/78 (BP Location: Left Arm, Patient Position: Sitting, Cuff Size: Normal)   Pulse 74   Temp 97.7 F (36.5 C) (Oral)   Ht 6' (1.829 m)   Wt 222 lb 11.2 oz (101 kg)   SpO2 98%   BMI 30.20 kg/m    Physical Exam Vitals reviewed.  Cardiovascular:     Rate and Rhythm: Normal rate and regular rhythm.  Pulmonary:     Effort: Pulmonary effort is normal.     Breath sounds: Normal breath sounds.  Musculoskeletal:     Comments: Left shoulder reveals good range of motion.  No biceps tenderness proximally or distally.  No pain with elbow flexion or extension against resistance. Only mild pain with abduction greater than 90 degrees against  resistance.  Minimal shoulder pain with internal rotation.  No acromioclavicular tenderness.  No pain with supination pronation of the elbow/forearm.  No pain with wrist extension or flexion against resistance.  Neurological:     Mental Status: He is alert.      No results found for any visits on 04/13/22.    The ASCVD Risk score (Arnett DK, et al., 2019) failed to calculate for the following reasons:   Cannot find a previous HDL lab   Cannot find a previous total cholesterol lab    Assessment & Plan:   Problem List Items Addressed This Visit   None Visit Diagnoses     Left elbow pain    -  Primary   Relevant Orders   DG Elbow Complete Left      Etiology unclear.  May have component of biceps tenderness but not reproduced on exam.  Doubt related to rotator cuff tendinitis in terms of his elbow pain.  No evidence for medial or lateral epicondylitis.  -Obtain x-rays left elbow -Consider trial of topical diclofenac -If not improving with the above consider sports medicine referral. No follow-ups on file.    Carolann Littler, MD

## 2022-05-04 ENCOUNTER — Encounter: Payer: Self-pay | Admitting: Family Medicine

## 2022-05-04 ENCOUNTER — Ambulatory Visit: Payer: BC Managed Care – PPO | Admitting: Family Medicine

## 2022-05-04 VITALS — BP 132/76 | HR 95 | Temp 97.8°F | Ht 72.0 in | Wt 220.5 lb

## 2022-05-04 DIAGNOSIS — J069 Acute upper respiratory infection, unspecified: Secondary | ICD-10-CM

## 2022-05-04 DIAGNOSIS — B9689 Other specified bacterial agents as the cause of diseases classified elsewhere: Secondary | ICD-10-CM | POA: Diagnosis not present

## 2022-05-04 DIAGNOSIS — H109 Unspecified conjunctivitis: Secondary | ICD-10-CM

## 2022-05-04 MED ORDER — POLYMYXIN B-TRIMETHOPRIM 10000-0.1 UNIT/ML-% OP SOLN: 2.0000 [drp] | OPHTHALMIC | 0 refills | Status: DC

## 2022-05-04 NOTE — Progress Notes (Signed)
Established Patient Office Visit  Subjective   Patient ID: Micheal Graham, male    DOB: 1955-10-20  Age: 67 y.o. MRN: [id number]  Chief Complaint  Patient presents with   Nasal Congestion    Patient complains of nasal congestion, x2 days, Tried Tylenol with little relief   Conjunctivitis    Patient complains of conjunctivitis, x2 days,     HPI   Mr. Dearinger is seen with onset of illness about a week ago.  He started off with some sore throat which is now better.  This was followed by some nasal congestion.  No cough.  No fever.  No body aches.  Saturday he noticed his left eye seem to be more watery than usual.  By Sunday he states that both eyes were "glued shut ".  He had some matting again this morning.  No blurred vision.  No eye pain.  No facial pain or daily headaches.  No known sick contacts.  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   severe-right shin lower (EXC)   Atypical nevus 01/16/2013   moderate-right lower back (WS)   Atypical nevus 05/29/2013   moderate-mid back    Basal cell carcinoma 07/15/1999   right neck (EXC)   Basal cell carcinoma 08/29/1999   sclerosis- right neck (MOHS)   Basal cell carcinoma 02/15/2008   right shin-(CX35FU)   Basal cell carcinoma 08/13/2014   nod-right mid back (CX35FU), nod-right side nose (EXC)   Basal cell carcinoma 08/23/2014   nod-right side nose sup (MOHS)   Basal cell carcinoma 08/12/2015   sup/nod-left ear rim (CX35FU)   Basal cell carcinoma 01/22/2016   sup-right scapula (CX35FU)   Basal cell carcinoma 06/09/2017   sup/nod-left post shoulder (CX35FU)   Basal cell carcinoma 11/17/2018   sup/nod-right side of nose (CX35FU)   Basal cell carcinoma 01/26/2019   nod-right side nose bridge, inf   BCC (basal cell carcinoma) 06/19/2019   below right ear-cx3 and Excision   Cancer ([place]) 2013   right shin   Celiac disease 01/11/2020   Chronic kidney disease    Cognitive impairment, mild,  so stated 01/23/2014   Colon polyps    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   Hypertension    Migraines    Nodular infiltrative basal cell carcinoma (BCC) 10/28/2020   Right Submandibular area   Squamous cell carcinoma of skin 09/07/2018   in situ-right mid shin (CX35FU)   Past Surgical History:  Procedure Laterality Date   ABDOMINAL HERNIA REPAIR  04/06/2010   APPENDECTOMY  04/06/1972   ENDOVENOUS ABLATION SAPHENOUS VEIN W/ LASER Left 11/05/2021   endovenous laser ablation left greater saphenous vein and stab phlebectomy > 20 incisions left leg by Gae Gallop MD   left knee  04/06/2009   surgery   VARICOSE VEIN SURGERY      reports that he quit smoking about 22 years ago. His smoking use included cigarettes. He has a 60.00 pack-year smoking history. He has never used smokeless tobacco. He reports that he does not drink alcohol and does not use drugs. family history includes Cancer in his maternal uncle; Heart disease in his father; Hypertension in his mother; Stroke in his paternal grandfather. Allergies  Allergen Reactions   Gluten Meal Diarrhea    Review of Systems  Constitutional:  Negative for chills and fever.  HENT:  Positive for congestion.   Eyes:  Positive for discharge and redness. Negative for blurred vision, photophobia  and pain.  Respiratory:  Negative for cough, sputum production and shortness of breath.       Objective:     BP 132/76 (BP Location: Left Arm, Patient Position: Sitting, Cuff Size: Normal)   Pulse 95   Temp 97.8 F (36.6 C) (Oral)   Ht 6' (1.829 m)   Wt 220 lb 8 oz (100 kg)   SpO2 95%   BMI 29.91 kg/m    Physical Exam Vitals reviewed.  Constitutional:      Appearance: Normal appearance.  HENT:     Right Ear: Tympanic membrane normal.     Left Ear: Tympanic membrane normal.     Mouth/Throat:     Mouth: Mucous membranes are moist.     Pharynx: Oropharynx is clear.  Eyes:     Comments: Conjunctive are erythematous bilaterally.   Otherwise, eye exam normal  Cardiovascular:     Rate and Rhythm: Normal rate and regular rhythm.  Pulmonary:     Effort: Pulmonary effort is normal.     Breath sounds: Normal breath sounds. No wheezing or rales.  Musculoskeletal:     Cervical back: Neck supple.  Lymphadenopathy:     Cervical: No cervical adenopathy.  Neurological:     Mental Status: He is alert.      No results found for any visits on 05/04/22.    The ASCVD Risk score (Arnett DK, et al., 2019) failed to calculate for the following reasons:   Cannot find a previous HDL lab   Cannot find a previous total cholesterol lab    Assessment & Plan:   #1 probable recent viral URI with cough.  Nonfocal exam other than conjunctivitis as below.  Plenty fluids and rest and follow-up promptly for any fever or other new symptoms  #2 probable bacterial conjunctivitis bilaterally.  Continue warm compresses several times daily.  Polytrim ophthalmic drops 2 drops each eye every 4 hours while awake.  Carolann Littler, MD

## 2022-05-14 ENCOUNTER — Other Ambulatory Visit: Payer: Self-pay | Admitting: Family Medicine

## 2022-05-14 DIAGNOSIS — F339 Major depressive disorder, recurrent, unspecified: Secondary | ICD-10-CM

## 2022-06-15 ENCOUNTER — Other Ambulatory Visit: Payer: Self-pay | Admitting: Family Medicine

## 2022-06-25 ENCOUNTER — Encounter: Payer: Self-pay | Admitting: Family Medicine

## 2022-08-09 ENCOUNTER — Other Ambulatory Visit: Payer: Self-pay | Admitting: Family Medicine

## 2022-08-09 DIAGNOSIS — F339 Major depressive disorder, recurrent, unspecified: Secondary | ICD-10-CM

## 2022-09-11 ENCOUNTER — Other Ambulatory Visit: Payer: Self-pay | Admitting: Family Medicine

## 2022-11-11 ENCOUNTER — Other Ambulatory Visit: Payer: Self-pay | Admitting: Family Medicine

## 2022-11-11 DIAGNOSIS — F339 Major depressive disorder, recurrent, unspecified: Secondary | ICD-10-CM

## 2022-11-19 ENCOUNTER — Ambulatory Visit: Payer: BC Managed Care – PPO | Admitting: Adult Health

## 2022-11-19 ENCOUNTER — Encounter: Payer: Self-pay | Admitting: Adult Health

## 2022-11-19 VITALS — BP 130/80 | HR 67 | Temp 97.8°F | Ht 72.0 in | Wt 222.0 lb

## 2022-11-19 DIAGNOSIS — G8929 Other chronic pain: Secondary | ICD-10-CM

## 2022-11-19 DIAGNOSIS — M25511 Pain in right shoulder: Secondary | ICD-10-CM | POA: Diagnosis not present

## 2022-11-19 MED ORDER — METHYLPREDNISOLONE ACETATE 80 MG/ML IJ SUSP
80.0000 mg | Freq: Once | INTRAMUSCULAR | Status: AC
Start: 2022-11-19 — End: 2022-11-19
  Administered 2022-11-19: 80 mg via INTRA_ARTICULAR

## 2022-11-19 NOTE — Progress Notes (Signed)
Subjective:    Patient ID: Micheal Graham, male    DOB: Jan 31, 1956, 67 y.o.   MRN: [id number]  Shoulder Pain    67 year old male who  has a past medical history of Allergy, Atypical nevus (07/14/2011), Atypical nevus (01/16/2013), Atypical nevus (05/29/2013), Basal cell carcinoma (07/15/1999), Basal cell carcinoma (08/29/1999), Basal cell carcinoma (02/15/2008), Basal cell carcinoma (08/13/2014), Basal cell carcinoma (08/23/2014), Basal cell carcinoma (08/12/2015), Basal cell carcinoma (01/22/2016), Basal cell carcinoma (06/09/2017), Basal cell carcinoma (11/17/2018), Basal cell carcinoma (01/26/2019), BCC (basal cell carcinoma) (06/19/2019), Cancer (HCC) (2013), Celiac disease (01/11/2020), Chronic kidney disease, Cognitive impairment, mild, so stated (01/23/2014), Colon polyps, Focal sensory loss (01/23/2014), Hypertension, Migraines, Nodular infiltrative basal cell carcinoma (BCC) (10/28/2020), and Squamous cell carcinoma of skin (09/07/2018).  He is a patient of Dr. Caryl Never who I am seeing today for chronic right shoulder pain. Onset was several years ago. He denies injury. He does not have any numbness or tingling. Pain is worse with abduction of left shoulder greater than 90 degrees. Pain seems to be worse in the morning. As the day goes on he has decreased strength. Pain started to get bad about a month ago. His last steroid injection was 4/23.    Review of Systems See HPI   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   severe-right shin lower (EXC)   Atypical nevus 01/16/2013   moderate-right lower back (WS)   Atypical nevus 05/29/2013   moderate-mid back    Basal cell carcinoma 07/15/1999   right neck (EXC)   Basal cell carcinoma 08/29/1999   sclerosis- right neck (MOHS)   Basal cell carcinoma 02/15/2008   right shin-(CX35FU)   Basal cell carcinoma 08/13/2014   nod-right mid back (CX35FU), nod-right side nose (EXC)   Basal cell carcinoma  08/23/2014   nod-right side nose sup (MOHS)   Basal cell carcinoma 08/12/2015   sup/nod-left ear rim (CX35FU)   Basal cell carcinoma 01/22/2016   sup-right scapula (CX35FU)   Basal cell carcinoma 06/09/2017   sup/nod-left post shoulder (CX35FU)   Basal cell carcinoma 11/17/2018   sup/nod-right side of nose (CX35FU)   Basal cell carcinoma 01/26/2019   nod-right side nose bridge, inf   BCC (basal cell carcinoma) 06/19/2019   below right ear-cx3 and Excision   Cancer ([facility]CC) 2013   right shin   Celiac disease 01/11/2020   Chronic kidney disease    Cognitive impairment, mild, so stated 01/23/2014   Colon polyps    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   Hypertension    Migraines    Nodular infiltrative basal cell carcinoma (BCC) 10/28/2020   Right Submandibular area   Squamous cell carcinoma of skin 09/07/2018   in situ-right mid shin (CX35FU)    Social History   Socioeconomic History   Marital status: Married    Spouse name: Not on file   Number of children: Not on file   Years of education: Not on file   Highest education level: 12th grade  Occupational History   Not on file  Tobacco Use   Smoking status: Former    Current packs/day: 0.00    Average packs/day: 2.0 packs/day for 30.0 years (60.0 ttl pk-yrs)    Types: Cigarettes    Start date: 09/08/1969    Quit date: 09/09/1999    Years since quitting: 23.2   Smokeless tobacco: Never  Vaping Use   Vaping status: Never Used  Substance and Sexual Activity  Alcohol use: No    Alcohol/week: 0.0 standard drinks of alcohol   Drug use: No   Sexual activity: Not on file  Other Topics Concern   Not on file  Social History Narrative   Right handed, caffeine 2 cups daily, FT -truck driver, Married, 2 kids, HS graduate   Social Determinants of Health   Financial Resource Strain: Low Risk  (05/03/2022)   Overall Financial Resource Strain (CARDIA)    Difficulty of Paying Living Expenses: Not hard at all  Food Insecurity: No Food  Insecurity (05/03/2022)   Hunger Vital Sign    Worried About Running Out of Food in the Last Year: Never true    Ran Out of Food in the Last Year: Never true  Transportation Needs: No Transportation Needs (05/03/2022)   PRAPARE - Administrator, Civil Service (Medical): No    Lack of Transportation (Non-Medical): No  Physical Activity: Insufficiently Active (05/03/2022)   Exercise Vital Sign    Days of Exercise per Week: 1 day    Minutes of Exercise per Session: 20 min  Stress: No Stress Concern Present (05/03/2022)   [employer] of Occupational Health - Occupational Stress Questionnaire    Feeling of Stress : Only a little  Social Connections: Unknown (05/03/2022)   Social Connection and Isolation Panel [NHANES]    Frequency of Communication with Friends and Family: More than three times a week    Frequency of Social Gatherings with Friends and Family: Once a week    Attends Religious Services: Patient declined    Database administrator or Organizations: No    Attends Engineer, structural: Not on file    Marital Status: Married  Intimate Partner Violence: Unknown (07/10/2021)   Received from [employer], [facility]   HITS    Physically Hurt: Not on file    Insult or Talk Down To: Not on file    Threaten Physical Harm: Not on file    Scream or Curse: Not on file    Past Surgical History:  Procedure Laterality Date   ABDOMINAL HERNIA REPAIR  04/06/2010   APPENDECTOMY  04/06/1972   ENDOVENOUS ABLATION SAPHENOUS VEIN W/ LASER Left 11/05/2021   endovenous laser ablation left greater saphenous vein and stab phlebectomy > 20 incisions left leg by Cari Caraway MD   left knee  04/06/2009   surgery   VARICOSE VEIN SURGERY      Family History  Problem Relation Age of Onset   Hypertension Mother    Heart disease Father    Cancer Maternal Uncle        prostate   Stroke Paternal Grandfather     Allergies  Allergen Reactions   Gluten Meal Diarrhea     Current Outpatient Medications on File Prior to Visit  Medication Sig Dispense Refill   acetaminophen (TYLENOL) 325 MG tablet Take 2 tablets (650 mg total) by mouth every 6 (six) hours as needed for mild pain or headache (fever >/= 101).     buPROPion (WELLBUTRIN XL) 300 MG 24 hr tablet TAKE 1 TABLET BY MOUTH EVERY DAY 90 tablet 0   diphenhydramine-acetaminophen (TYLENOL PM) 25-500 MG TABS tablet Take 1 tablet by mouth at bedtime as needed (sleep).     fexofenadine (ALLEGRA) 180 MG tablet Take 180 mg by mouth daily.     sertraline (ZOLOFT) 100 MG tablet TAKE 1 TABLET BY MOUTH EVERY DAY 90 tablet 0   No current facility-administered medications on file prior to  visit.    BP 130/80   Pulse 67   Temp 97.8 F (36.6 C) (Oral)   Ht 6' (1.829 m)   Wt 222 lb (100.7 kg)   SpO2 97%   BMI 30.11 kg/m       Objective:   Physical Exam Vitals and nursing note reviewed.  Constitutional:      Appearance: Normal appearance.  Musculoskeletal:        General: Normal range of motion.     Comments: Right shoulder does not have any visible swelling. Mild subacromial tenderness. Pain with abduction greater than 90 degrees. No erythema. No warmth.      Skin:    General: Skin is warm and dry.  Neurological:     General: No focal deficit present.     Mental Status: He is alert and oriented to person, place, and time.  Psychiatric:        Mood and Affect: Mood normal.        Behavior: Behavior normal.        Thought Content: Thought content normal.        Judgment: Judgment normal.        Assessment & Plan:  1. Chronic right shoulder pain Joint Injection/Arthrocentesis  Date/Time: 11/19/2022 9:59 AM  Performed by: Shirline Frees, NP Authorized by: Shirline Frees, NP  Indications: pain  Body area: shoulder Joint: left acromioclavicular joint Local anesthesia used: yes  Anesthesia: Local anesthesia used: yes Local Anesthetic: topical anesthetic and lidocaine 2% without  epinephrine Anesthetic total: 2 mL  Sedation: Patient sedated: no  Preparation: Patient was prepped and draped in the usual sterile fashion (using bedadine). Needle size: 20 G Ultrasound guidance: no Approach: posterior lateral. Methylprednisolone amount: 80 mg Lidocaine 2% amount: 2 mL Patient tolerance: patient tolerated the procedure well with no immediate complications     - methylPREDNISolone acetate (DEPO-MEDROL) injection 80 mg

## 2022-12-11 ENCOUNTER — Other Ambulatory Visit: Payer: Self-pay | Admitting: Family Medicine

## 2023-01-18 ENCOUNTER — Ambulatory Visit: Payer: BC Managed Care – PPO | Admitting: Family Medicine

## 2023-01-19 ENCOUNTER — Ambulatory Visit: Payer: BC Managed Care – PPO | Admitting: Family Medicine

## 2023-01-19 ENCOUNTER — Encounter: Payer: Self-pay | Admitting: Family Medicine

## 2023-01-19 VITALS — BP 144/80 | HR 69 | Temp 97.6°F | Ht 72.0 in | Wt 225.9 lb

## 2023-01-19 DIAGNOSIS — Z23 Encounter for immunization: Secondary | ICD-10-CM

## 2023-01-19 DIAGNOSIS — M7581 Other shoulder lesions, right shoulder: Secondary | ICD-10-CM | POA: Diagnosis not present

## 2023-01-19 MED ORDER — METHYLPREDNISOLONE ACETATE 40 MG/ML IJ SUSP
40.0000 mg | Freq: Once | INTRAMUSCULAR | Status: AC
Start: 2023-01-19 — End: 2023-01-19
  Administered 2023-01-19: 40 mg via INTRA_ARTICULAR

## 2023-01-19 NOTE — Progress Notes (Signed)
Established Patient Office Visit  Subjective   Patient ID: Micheal Graham, male    DOB: 11/17/1955  Age: 67 y.o. MRN: [id number]  No chief complaint on file.   HPI   Micheal Graham is seen today with some ongoing right shoulder pain.  He was seen here in August in my absence and received steroid injection without much improvement.  We had injected his shoulders twice previously and he states he had almost total resolution.  He is requesting repeat injection today.  Denies recent injury.  Right shoulder pain is worse with abduction around 90 degrees.  No pain with internal rotation.  No cervical neck pain.  No weakness.  Denies any recent falls.  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   severe-right shin lower (EXC)   Atypical nevus 01/16/2013   moderate-right lower back (WS)   Atypical nevus 05/29/2013   moderate-mid back    Basal cell carcinoma 07/15/1999   right neck (EXC)   Basal cell carcinoma 08/29/1999   sclerosis- right neck (MOHS)   Basal cell carcinoma 02/15/2008   right shin-(CX35FU)   Basal cell carcinoma 08/13/2014   nod-right mid back (CX35FU), nod-right side nose (EXC)   Basal cell carcinoma 08/23/2014   nod-right side nose sup (MOHS)   Basal cell carcinoma 08/12/2015   sup/nod-left ear rim (CX35FU)   Basal cell carcinoma 01/22/2016   sup-right scapula (CX35FU)   Basal cell carcinoma 06/09/2017   sup/nod-left post shoulder (CX35FU)   Basal cell carcinoma 11/17/2018   sup/nod-right side of nose (CX35FU)   Basal cell carcinoma 01/26/2019   nod-right side nose bridge, inf   BCC (basal cell carcinoma) 06/19/2019   below right ear-cx3 and Excision   Cancer ([facility]CC) 2013   right shin   Celiac disease 01/11/2020   Chronic kidney disease    Cognitive impairment, mild, so stated 01/23/2014   Colon polyps    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   Hypertension    Migraines    Nodular infiltrative basal cell carcinoma (BCC) 10/28/2020    Right Submandibular area   Squamous cell carcinoma of skin 09/07/2018   in situ-right mid shin (CX35FU)   Past Surgical History:  Procedure Laterality Date   ABDOMINAL HERNIA REPAIR  04/06/2010   APPENDECTOMY  04/06/1972   ENDOVENOUS ABLATION SAPHENOUS VEIN W/ LASER Left 11/05/2021   endovenous laser ablation left greater saphenous vein and stab phlebectomy > 20 incisions left leg by Cari Caraway MD   left knee  04/06/2009   surgery   VARICOSE VEIN SURGERY      reports that he quit smoking about 23 years ago. His smoking use included cigarettes. He started smoking about 53 years ago. He has a 60 pack-year smoking history. He has never used smokeless tobacco. He reports that he does not drink alcohol and does not use drugs. family history includes Cancer in his maternal uncle; Heart disease in his father; Hypertension in his mother; Stroke in his paternal grandfather. Allergies  Allergen Reactions   Gluten Meal Diarrhea    Review of Systems  Musculoskeletal:  Negative for neck pain.  Neurological:  Negative for focal weakness.      Objective:     BP (!) 144/80 (BP Location: Left Arm, Patient Position: Sitting, Cuff Size: Normal)   Pulse 69   Temp 97.6 F (36.4 C) (Oral)   Ht 6' (1.829 m)   Wt 225 lb 14.4 oz (102.5 kg)   SpO2  97%   BMI 30.64 kg/m  BP Readings from Last 3 Encounters:  01/19/23 (!) 144/80  11/19/22 130/80  05/04/22 132/76   Wt Readings from Last 3 Encounters:  01/19/23 225 lb 14.4 oz (102.5 kg)  11/19/22 222 lb (100.7 kg)  05/04/22 220 lb 8 oz (100 kg)      Physical Exam Vitals reviewed.  Constitutional:      Appearance: Normal appearance.  Cardiovascular:     Rate and Rhythm: Normal rate and regular rhythm.  Pulmonary:     Effort: Pulmonary effort is normal.     Breath sounds: Normal breath sounds. No wheezing or rales.  Musculoskeletal:     Comments: Right shoulder reveals good range of motion.  No localized tenderness.  Does have some pain  with abduction greater than 90 degrees against resistance.  No pain with internal rotation.  No biceps or acromioclavicular tenderness.  Neurological:     Mental Status: He is alert.     Comments: No weakness involving rotator cuff      No results found for any visits on 01/19/23.    The ASCVD Risk score (Arnett DK, et al., 2019) failed to calculate for the following reasons:   Cannot find a previous HDL lab   Cannot find a previous total cholesterol lab    Assessment & Plan:   Right shoulder pain.  Suspect rotator cuff tendinitis.  Did not respond much to injection 2 months ago.  He is requesting repeat injection as he has responded well in the past to steroid injections.  Does not have any evidence currently to suggest adhesive capsulitis.  No clinical evidence to suggest rotator cuff tear   Discussed risks and benefits of corticosteroid injection and patient consented.  After prepping skin with betadine, injected 40 mg depomedrol and 2 cc of plain xylocaine with 25 gauge one and one half inch needle using posterior lateral approach and pt tolerated well. -Gentle range of motion as tolerated -Touch base in couple weeks if not resolving/improving  Evelena Peat, MD

## 2023-01-19 NOTE — Addendum Note (Signed)
Addended by: Christy Sartorius on: 01/19/2023 10:21 AM   Modules accepted: Orders

## 2023-01-20 ENCOUNTER — Ambulatory Visit: Payer: BC Managed Care – PPO

## 2023-02-16 ENCOUNTER — Other Ambulatory Visit: Payer: Self-pay | Admitting: Family Medicine

## 2023-02-16 DIAGNOSIS — F339 Major depressive disorder, recurrent, unspecified: Secondary | ICD-10-CM

## 2023-02-22 ENCOUNTER — Encounter: Payer: Self-pay | Admitting: Family Medicine

## 2023-02-22 ENCOUNTER — Ambulatory Visit: Payer: BC Managed Care – PPO | Admitting: Family Medicine

## 2023-02-22 VITALS — BP 180/100 | HR 71 | Temp 98.2°F | Ht 72.0 in | Wt 230.2 lb

## 2023-02-22 DIAGNOSIS — L84 Corns and callosities: Secondary | ICD-10-CM | POA: Diagnosis not present

## 2023-02-22 DIAGNOSIS — I10 Essential (primary) hypertension: Secondary | ICD-10-CM | POA: Diagnosis not present

## 2023-02-22 DIAGNOSIS — L03115 Cellulitis of right lower limb: Secondary | ICD-10-CM

## 2023-02-22 MED ORDER — CEPHALEXIN 500 MG PO CAPS: 500.0000 mg | ORAL_CAPSULE | Freq: Four times a day (QID) | ORAL | 0 refills | Status: DC

## 2023-02-22 NOTE — Progress Notes (Signed)
Established Patient Office Visit  Subjective   Patient ID: Micheal Graham, male    DOB: [birth year]  Age: 67 y.o. MRN: [id number]  Chief Complaint  Patient presents with   Foot Pain    Patient complains of right foot pain, x3 days, Tried Tylenol    HPI   Micheal Graham is here with right foot pain past few days.  He states he actually developed some bilateral foot pain near the ball of the feet over the weekend.  As the weekend progressed his right foot became much more painful than the left.  He noticed some soreness to touch near the ball the foot.  Sometimes walks barefoot at home on wood floors.  Denies any known recent foreign body.  Does have some chronic neuropathy symptoms.  Denies any recent fever or chills.  He folic he had little bit of swelling in the right foot.  Sharp to stinging pain especially with weightbearing but also some at rest and to touch  He has history of hypertension.  He had COVID back few years ago and blood pressure dropped during that time and was taken off blood pressure medications.  Previously had been on amlodipine benazepril combination.  Blood pressures have generally been well-controlled.  Denies any recent headaches or dizziness.  History of multiple skin cancers.  Had recent basal cell left side of neck and has scheduled Mohs surgery week from today.  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   severe-right shin lower (EXC)   Atypical nevus 01/16/2013   moderate-right lower back (WS)   Atypical nevus 05/29/2013   moderate-mid back    Basal cell carcinoma 07/15/1999   right neck (EXC)   Basal cell carcinoma 08/29/1999   sclerosis- right neck (MOHS)   Basal cell carcinoma 02/15/2008   right shin-(CX35FU)   Basal cell carcinoma 08/13/2014   nod-right mid back (CX35FU), nod-right side nose (EXC)   Basal cell carcinoma 08/23/2014   nod-right side nose sup (MOHS)   Basal cell carcinoma 08/12/2015   sup/nod-left ear  rim (CX35FU)   Basal cell carcinoma 01/22/2016   sup-right scapula (CX35FU)   Basal cell carcinoma 06/09/2017   sup/nod-left post shoulder (CX35FU)   Basal cell carcinoma 11/17/2018   sup/nod-right side of nose (CX35FU)   Basal cell carcinoma 01/26/2019   nod-right side nose bridge, inf   BCC (basal cell carcinoma) 06/19/2019   below right ear-cx3 and Excision   Cancer (HCC) 2013   right shin   Celiac disease 01/11/2020   Chronic kidney disease    Cognitive impairment, mild, so stated 01/23/2014   Colon polyps    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   Hypertension    Migraines    Nodular infiltrative basal cell carcinoma (BCC) 10/28/2020   Right Submandibular area   Squamous cell carcinoma of skin 09/07/2018   in situ-right mid shin (CX35FU)   Past Surgical History:  Procedure Laterality Date   ABDOMINAL HERNIA REPAIR  04/06/2010   APPENDECTOMY  04/06/1972   ENDOVENOUS ABLATION SAPHENOUS VEIN W/ LASER Left 11/05/2021   endovenous laser ablation left greater saphenous vein and stab phlebectomy > 20 incisions left leg by Cari Caraway MD   left knee  04/06/2009   surgery   VARICOSE VEIN SURGERY      reports that he quit smoking about 23 years ago. His smoking use included cigarettes. He started smoking about 53 years ago. He has a 60 pack-year smoking history.  He has never used smokeless tobacco. He reports that he does not drink alcohol and does not use drugs. family history includes Cancer in his maternal uncle; Heart disease in his father; Hypertension in his mother; Stroke in his paternal grandfather. Allergies  Allergen Reactions   Gluten Meal Diarrhea    Review of Systems  Constitutional:  Negative for chills, fever and malaise/fatigue.  Eyes:  Negative for blurred vision.  Respiratory:  Negative for shortness of breath.   Cardiovascular:  Negative for chest pain.  Neurological:  Negative for dizziness, weakness and headaches.      Objective:     BP (!) 180/100    Pulse 71   Temp 98.2 F (36.8 C) (Oral)   Ht 6' (1.829 m)   Wt 230 lb 3.2 oz (104.4 kg)   SpO2 98%   BMI 31.22 kg/m  BP Readings from Last 3 Encounters:  02/22/23 (!) 180/100  01/19/23 (!) 144/80  11/19/22 130/80   Wt Readings from Last 3 Encounters:  02/22/23 230 lb 3.2 oz (104.4 kg)  01/19/23 225 lb 14.4 oz (102.5 kg)  11/19/22 222 lb (100.7 kg)      Physical Exam Vitals reviewed.  Constitutional:      General: He is not in acute distress.    Appearance: Normal appearance. He is not toxic-appearing.  Cardiovascular:     Rate and Rhythm: Normal rate and regular rhythm.  Pulmonary:     Effort: Pulmonary effort is normal.     Breath sounds: Normal breath sounds.  Skin:    Comments: Right foot reveals approximately 2 x 2 mm area of what appears to be some dried blood near the ball the foot.  This area is tender to palpation and slightly swollen and also some faint erythema.  Slightly warm to touch.  Wheeze #15 blade and removed little bit of callus tissue and remove the area of darkened skin which appear to be dried blood.  Did not palpate any foreign bodies or see any visible foreign bodies in the skin.  He does have some redness extending dorsally between the second and third toes. No evidence for tinea pedis.  Neurological:     Mental Status: He is alert.      No results found for any visits on 02/22/23.  Last CBC Lab Results  Component Value Date   WBC 8.1 06/03/2020   HGB 14.5 06/03/2020   HCT 42.9 06/03/2020   MCV 85.9 06/03/2020   MCH 32.7 02/05/2020   RDW 16.1 (H) 06/03/2020   PLT 151.0 06/03/2020   Last metabolic panel Lab Results  Component Value Date   GLUCOSE 124 (H) 02/05/2020   NA 139 02/05/2020   K 4.7 02/05/2020   CL 105 02/05/2020   CO2 26 02/05/2020   BUN 35 (H) 02/05/2020   CREATININE 1.70 (H) 02/05/2020   GFRNONAA 52 (L) 01/17/2020   CALCIUM 9.4 02/05/2020   PROT 6.8 02/05/2020   ALBUMIN 2.5 (L) 01/16/2020   LABGLOB 3.2 01/23/2014    AGRATIO 1.4 01/23/2014   BILITOT 0.5 02/05/2020   ALKPHOS 67 01/16/2020   AST 18 02/05/2020   ALT 30 02/05/2020   ANIONGAP 10 01/17/2020      The ASCVD Risk score (Arnett DK, et al., 2019) failed to calculate for the following reasons:   Cannot find a previous HDL lab   Cannot find a previous total cholesterol lab    Assessment & Plan:   #1 cellulitis right foot.  Not clear if he  may have stepped on foreign body.  Very small amount of what appears to be dried blood on the bottom of the foot.  This was removed and we did not see or palpate any definite foreign bodies. -Start Keflex 500 mg 4 times daily for 7 days -Elevate foot frequently -Consider heating pad intermittently to right foot -Follow-up immediately for any fever or any progressive redness or swelling -Set up 1 week follow-up to reassess  #2 hypertension history.  He has been off medications now for over couple years and has elevation today.  Initial reading 180/100 and repeat left arm seated after rest 164/82.  Patient has home cuff and would like to monitor this over the next week before making decision to start back medication.  Watch sodium intake.  Bring back log of readings from home as well to reassess at visit in 1 week  Evelena Peat, MD

## 2023-03-01 ENCOUNTER — Ambulatory Visit: Payer: BC Managed Care – PPO | Admitting: Family Medicine

## 2023-03-02 ENCOUNTER — Ambulatory Visit: Payer: BC Managed Care – PPO | Admitting: Family Medicine

## 2023-03-02 ENCOUNTER — Encounter: Payer: Self-pay | Admitting: Family Medicine

## 2023-03-02 VITALS — BP 148/80 | HR 100 | Temp 98.1°F | Ht 72.0 in | Wt 231.1 lb

## 2023-03-02 DIAGNOSIS — F419 Anxiety disorder, unspecified: Secondary | ICD-10-CM | POA: Diagnosis not present

## 2023-03-02 DIAGNOSIS — I10 Essential (primary) hypertension: Secondary | ICD-10-CM | POA: Diagnosis not present

## 2023-03-02 DIAGNOSIS — M79671 Pain in right foot: Secondary | ICD-10-CM | POA: Diagnosis not present

## 2023-03-02 NOTE — Patient Instructions (Signed)
Increase the Sertraline 100 mg to one and one half (150 mg ) once daily  Monitor blood pressure next 2-3 weeks and be in touch if not consistently < 140 systolic (top number).

## 2023-03-02 NOTE — Progress Notes (Signed)
Established Patient Office Visit  Subjective   Patient ID: Micheal Graham, male    DOB: 05/04/1955  Age: 67 y.o. MRN: [id number]  Chief Complaint  Patient presents with   Medical Management of Chronic Issues    HPI   For follow-up regarding recent foot pain and probable early cellulitis right foot.  Refer to previous note for detail.  He had a very small amount of possible dried blood on the bottom of the foot and this was removed and we did not see or palpate any foreign body.  He has some mild erythema in this region extending dorsally up on the foot.  He started Keflex for 7 days and feels like the erythema has resolved.  No fevers or chills.  Still some soreness in this area.  History of hypertension.  Had COVID few years ago and had hypotension afterwards and came off medication.  Recent elevated reading.  He feels like this may be stress related.  He relates increased anxiety symptoms.  Was walking his dog in his neighborhood several months ago and another dog attacked his.  He states has had extreme anxiety since then.  Currently on sertraline 100 mg daily.  Would like to consider possible titration.  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   severe-right shin lower (EXC)   Atypical nevus 01/16/2013   moderate-right lower back (WS)   Atypical nevus 05/29/2013   moderate-mid back    Basal cell carcinoma 07/15/1999   right neck (EXC)   Basal cell carcinoma 08/29/1999   sclerosis- right neck (MOHS)   Basal cell carcinoma 02/15/2008   right shin-(CX35FU)   Basal cell carcinoma 08/13/2014   nod-right mid back (CX35FU), nod-right side nose (EXC)   Basal cell carcinoma 08/23/2014   nod-right side nose sup (MOHS)   Basal cell carcinoma 08/12/2015   sup/nod-left ear rim (CX35FU)   Basal cell carcinoma 01/22/2016   sup-right scapula (CX35FU)   Basal cell carcinoma 06/09/2017   sup/nod-left post shoulder (CX35FU)   Basal cell carcinoma  11/17/2018   sup/nod-right side of nose (CX35FU)   Basal cell carcinoma 01/26/2019   nod-right side nose bridge, inf   BCC (basal cell carcinoma) 06/19/2019   below right ear-cx3 and Excision   Cancer (HCC) 2013   right shin   Celiac disease 01/11/2020   Chronic kidney disease    Cognitive impairment, mild, so stated 01/23/2014   Colon polyps    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   Hypertension    Migraines    Nodular infiltrative basal cell carcinoma (BCC) 10/28/2020   Right Submandibular area   Squamous cell carcinoma of skin 09/07/2018   in situ-right mid shin (CX35FU)   Past Surgical History:  Procedure Laterality Date   ABDOMINAL HERNIA REPAIR  04/06/2010   APPENDECTOMY  04/06/1972   ENDOVENOUS ABLATION SAPHENOUS VEIN W/ LASER Left 11/05/2021   endovenous laser ablation left greater saphenous vein and stab phlebectomy > 20 incisions left leg by Cari Caraway MD   left knee  04/06/2009   surgery   VARICOSE VEIN SURGERY      reports that he quit smoking about 23 years ago. His smoking use included cigarettes. He started smoking about 53 years ago. He has a 60 pack-year smoking history. He has never used smokeless tobacco. He reports that he does not drink alcohol and does not use drugs. family history includes Cancer in his maternal uncle; Heart disease in his  father; Hypertension in his mother; Stroke in his paternal grandfather. Allergies  Allergen Reactions   Gluten Meal Diarrhea    Review of Systems  Constitutional:  Negative for malaise/fatigue.  Eyes:  Negative for blurred vision.  Respiratory:  Negative for shortness of breath.   Cardiovascular:  Negative for chest pain.  Neurological:  Negative for dizziness, weakness and headaches.  Psychiatric/Behavioral:  The patient is nervous/anxious.       Objective:     BP (!) 148/80 (BP Location: Left Arm, Cuff Size: Normal)   Pulse 100   Temp 98.1 F (36.7 C) (Oral)   Ht 6' (1.829 m)   Wt 231 lb 1.6 oz (104.8 kg)    SpO2 97%   BMI 31.34 kg/m  BP Readings from Last 3 Encounters:  03/02/23 (!) 148/80  02/22/23 (!) 180/100  01/19/23 (!) 144/80   Wt Readings from Last 3 Encounters:  03/02/23 231 lb 1.6 oz (104.8 kg)  02/22/23 230 lb 3.2 oz (104.4 kg)  01/19/23 225 lb 14.4 oz (102.5 kg)      Physical Exam Vitals reviewed.  Constitutional:      General: He is not in acute distress.    Appearance: He is well-developed. He is not ill-appearing.  Neck:     Thyroid: No thyromegaly.  Cardiovascular:     Rate and Rhythm: Normal rate and regular rhythm.  Pulmonary:     Effort: Pulmonary effort is normal. No respiratory distress.     Breath sounds: Normal breath sounds. No wheezing or rales.  Musculoskeletal:     Cervical back: Neck supple.  Skin:    Comments: Right foot examined.  Erythema previously noted has improved and basically resolved.  He has a small callused area on the ball the foot near area of tenderness.  We again visually examined and palpated the area and did not see any foreign body.  Neurological:     Mental Status: He is alert and oriented to person, place, and time.      No results found for any visits on 03/02/23.    The ASCVD Risk score (Arnett DK, et al., 2019) failed to calculate for the following reasons:   Cannot find a previous HDL lab   Cannot find a previous total cholesterol lab    Assessment & Plan:   #1 recent cellulitis right foot.  He has some ongoing mild tenderness but cellulitis changes have resolved.  Could still have potentially very small foreign body but we did not see or palpate anything and no clear entry wound.  We recommend some warm water soaks and if not resolving next couple weeks consider podiatry referral  #2 elevated blood pressure.  Up initially but did improve substantially after rest.  He thinks some of this may be anxiety related.  Recommend close home monitoring and be in touch if consistently over 140 systolic.  This is substantially  improved today compared to last visit  #3 increased anxiety symptoms.  Currently on sertraline 100 mg daily.  Will try bumping this up to 150 mg by taking 1-1/2 100 mg tablets and give feedback in 2 to 3 weeks.  If anxiety remains up at that point consider possible addition of low-dose BuSpar    Evelena Peat, MD

## 2023-03-09 ENCOUNTER — Other Ambulatory Visit: Payer: Self-pay | Admitting: Family Medicine

## 2023-04-19 ENCOUNTER — Encounter: Payer: Self-pay | Admitting: Family Medicine

## 2023-04-19 ENCOUNTER — Ambulatory Visit: Payer: BC Managed Care – PPO | Admitting: Family Medicine

## 2023-04-19 VITALS — BP 150/84 | HR 73 | Temp 98.0°F | Wt 233.2 lb

## 2023-04-19 DIAGNOSIS — J029 Acute pharyngitis, unspecified: Secondary | ICD-10-CM | POA: Diagnosis not present

## 2023-04-19 LAB — POC COVID19 BINAXNOW: SARS Coronavirus 2 Ag: NEGATIVE

## 2023-04-19 NOTE — Progress Notes (Signed)
 Established Patient Office Visit  Subjective   Patient ID: Micheal Graham, male    DOB: May 10, 1955  Age: 68 y.o. MRN: [id number]  Chief Complaint  Patient presents with   Sinusitis    Patient complains of sinusitis, x1 day, Reports greenish nasal discharge   Cough    Patient complains of cough, x1 day    Chills    Patient complains of chills, x1 day     HPI   Seen today with 1 day history of sinusitis symptoms with sinus congestion, cough, sore throat, chills without fever.  Sore throat symptoms are slightly better today.  Cough productive of greenish sputum.  Onset was yesterday.  He woke up around 3 AM with some sweats.  No nausea, vomiting, or diarrhea.  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   severe-right shin lower (EXC)   Atypical nevus 01/16/2013   moderate-right lower back (WS)   Atypical nevus 05/29/2013   moderate-mid back    Basal cell carcinoma 07/15/1999   right neck (EXC)   Basal cell carcinoma 08/29/1999   sclerosis- right neck (MOHS)   Basal cell carcinoma 02/15/2008   right shin-(CX35FU)   Basal cell carcinoma 08/13/2014   nod-right mid back (CX35FU), nod-right side nose (EXC)   Basal cell carcinoma 08/23/2014   nod-right side nose sup (MOHS)   Basal cell carcinoma 08/12/2015   sup/nod-left ear rim (CX35FU)   Basal cell carcinoma 01/22/2016   sup-right scapula (CX35FU)   Basal cell carcinoma 06/09/2017   sup/nod-left post shoulder (CX35FU)   Basal cell carcinoma 11/17/2018   sup/nod-right side of nose (CX35FU)   Basal cell carcinoma 01/26/2019   nod-right side nose bridge, inf   BCC (basal cell carcinoma) 06/19/2019   below right ear-cx3 and Excision   Cancer (HCC) 2013   right shin   Celiac disease 01/11/2020   Chronic kidney disease    Cognitive impairment, mild, so stated 01/23/2014   Colon polyps    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   Hypertension    Migraines    Nodular infiltrative basal cell  carcinoma (BCC) 10/28/2020   Right Submandibular area   Squamous cell carcinoma of skin 09/07/2018   in situ-right mid shin (CX35FU)   Past Surgical History:  Procedure Laterality Date   ABDOMINAL HERNIA REPAIR  04/06/2010   APPENDECTOMY  04/06/1972   ENDOVENOUS ABLATION SAPHENOUS VEIN W/ LASER Left 11/05/2021   endovenous laser ablation left greater saphenous vein and stab phlebectomy > 20 incisions left leg by Medford Blade MD   left knee  04/06/2009   surgery   VARICOSE VEIN SURGERY      reports that he quit smoking about 23 years ago. His smoking use included cigarettes. He started smoking about 53 years ago. He has a 60 pack-year smoking history. He has never used smokeless tobacco. He reports that he does not drink alcohol and does not use drugs. family history includes Cancer in his maternal uncle; Heart disease in his father; Hypertension in his mother; Stroke in his paternal grandfather. Allergies  Allergen Reactions   Gluten Meal Diarrhea    Review of Systems  Constitutional:  Positive for chills.  HENT:  Positive for congestion and sore throat. Negative for ear pain.   Respiratory:  Positive for cough.       Objective:     BP (!) 150/84 (BP Location: Left Arm, Cuff Size: Normal)   Pulse 73   Temp 98  F (36.7 C) (Oral)   Wt 233 lb 3.2 oz (105.8 kg)   SpO2 97%   BMI 31.63 kg/m  BP Readings from Last 3 Encounters:  04/19/23 (!) 150/84  03/02/23 (!) 148/80  02/22/23 (!) 180/100   Wt Readings from Last 3 Encounters:  04/19/23 233 lb 3.2 oz (105.8 kg)  03/02/23 231 lb 1.6 oz (104.8 kg)  02/22/23 230 lb 3.2 oz (104.4 kg)      Physical Exam Vitals reviewed.  Constitutional:      General: He is not in acute distress.    Appearance: Normal appearance. He is not ill-appearing.  HENT:     Right Ear: Tympanic membrane normal.     Left Ear: Tympanic membrane normal.     Mouth/Throat:     Mouth: Mucous membranes are moist.     Pharynx: Oropharynx is clear. No  oropharyngeal exudate or posterior oropharyngeal erythema.  Cardiovascular:     Rate and Rhythm: Normal rate and regular rhythm.  Pulmonary:     Effort: Pulmonary effort is normal.     Breath sounds: Normal breath sounds. No wheezing or rales.  Musculoskeletal:     Cervical back: Neck supple.  Lymphadenopathy:     Cervical: No cervical adenopathy.  Neurological:     Mental Status: He is alert.      Results for orders placed or performed in visit on 04/19/23  POC COVID-19  Result Value Ref Range   SARS Coronavirus 2 Ag Negative Negative      The ASCVD Risk score (Arnett DK, et al., 2019) failed to calculate for the following reasons:   Cannot find a previous HDL lab   Cannot find a previous total cholesterol lab    Assessment & Plan:   Problem List Items Addressed This Visit   None Visit Diagnoses       Sore throat    -  Primary   Relevant Orders   POC COVID-19 (Completed)     Probable viral syndrome.  COVID screen negative.  Recommend symptomatic measures with over-the-counter medications such as Mucinex , plenty fluids and rest.  Follow-up for any persistent or worsening symptoms.  No follow-ups on file.    Wolm Scarlet, MD

## 2023-04-22 ENCOUNTER — Encounter: Payer: Self-pay | Admitting: Family Medicine

## 2023-04-23 MED ORDER — AZITHROMYCIN 250 MG PO TABS
ORAL_TABLET | ORAL | 0 refills | Status: AC
Start: 2023-04-23 — End: 2023-04-28

## 2023-04-23 NOTE — Telephone Encounter (Signed)
Copied from CRM [id number]. Topic: Clinical - Medication Question >> Apr 23, 2023  9:06 AM Isabell A wrote: Reason for CRM: Patient was seen recently and told to call back if he's not better and needs antibiotics. Patient is requesting antibiotics.

## 2023-05-19 ENCOUNTER — Other Ambulatory Visit: Payer: Self-pay | Admitting: Family Medicine

## 2023-05-19 DIAGNOSIS — F339 Major depressive disorder, recurrent, unspecified: Secondary | ICD-10-CM

## 2023-05-28 ENCOUNTER — Encounter: Payer: Self-pay | Admitting: Family Medicine

## 2023-06-09 ENCOUNTER — Other Ambulatory Visit: Payer: Self-pay | Admitting: Family Medicine

## 2023-06-11 ENCOUNTER — Encounter: Payer: Self-pay | Admitting: Family Medicine

## 2023-06-11 ENCOUNTER — Ambulatory Visit: Admitting: Family Medicine

## 2023-06-11 VITALS — BP 150/92 | HR 68 | Temp 98.2°F | Wt 229.8 lb

## 2023-06-11 DIAGNOSIS — K900 Celiac disease: Secondary | ICD-10-CM | POA: Diagnosis not present

## 2023-06-11 DIAGNOSIS — K909 Intestinal malabsorption, unspecified: Secondary | ICD-10-CM | POA: Diagnosis not present

## 2023-06-11 DIAGNOSIS — I10 Essential (primary) hypertension: Secondary | ICD-10-CM

## 2023-06-11 DIAGNOSIS — R739 Hyperglycemia, unspecified: Secondary | ICD-10-CM

## 2023-06-11 DIAGNOSIS — R635 Abnormal weight gain: Secondary | ICD-10-CM

## 2023-06-11 LAB — COMPREHENSIVE METABOLIC PANEL
ALT: 15 U/L (ref 0–53)
AST: 14 U/L (ref 0–37)
Albumin: 4.3 g/dL (ref 3.5–5.2)
Alkaline Phosphatase: 127 U/L — ABNORMAL HIGH (ref 39–117)
BUN: 19 mg/dL (ref 6–23)
CO2: 26 meq/L (ref 19–32)
Calcium: 9 mg/dL (ref 8.4–10.5)
Chloride: 104 meq/L (ref 96–112)
Creatinine, Ser: 1.45 mg/dL (ref 0.40–1.50)
GFR: 49.2 mL/min — ABNORMAL LOW (ref >60.00)
Glucose, Bld: 116 mg/dL — ABNORMAL HIGH (ref 70–99)
Potassium: 4.8 meq/L (ref 3.5–5.1)
Sodium: 139 meq/L (ref 135–145)
Total Bilirubin: 0.3 mg/dL (ref 0.2–1.2)
Total Protein: 7.4 g/dL (ref 6.0–8.3)

## 2023-06-11 LAB — LIPID PANEL
Cholesterol: 180 mg/dL (ref 0–200)
HDL: 38.6 mg/dL — ABNORMAL LOW (ref >39.00)
LDL Cholesterol: 87 mg/dL (ref 0–99)
NonHDL: 141.89
Total CHOL/HDL Ratio: 5
Triglycerides: 275 mg/dL — ABNORMAL HIGH (ref 0.0–149.0)
VLDL: 55 mg/dL — ABNORMAL HIGH (ref 0.0–40.0)

## 2023-06-11 LAB — CBC WITH DIFFERENTIAL/PLATELET
Basophils Absolute: 0.1 10*3/uL (ref 0.0–0.1)
Basophils Relative: 0.8 % (ref 0.0–3.0)
Eosinophils Absolute: 0.2 10*3/uL (ref 0.0–0.7)
Eosinophils Relative: 2.3 % (ref 0.0–5.0)
HCT: 42.7 % (ref 39.0–52.0)
Hemoglobin: 14.3 g/dL (ref 13.0–17.0)
Lymphocytes Relative: 22.8 % (ref 12.0–46.0)
Lymphs Abs: 1.7 10*3/uL (ref 0.7–4.0)
MCHC: 33.4 g/dL (ref 30.0–36.0)
MCV: 93.4 fl (ref 78.0–100.0)
Monocytes Absolute: 0.5 10*3/uL (ref 0.1–1.0)
Monocytes Relative: 6.4 % (ref 3.0–12.0)
Neutro Abs: 5.2 10*3/uL (ref 1.4–7.7)
Neutrophils Relative %: 67.7 % (ref 43.0–77.0)
Platelets: 157 10*3/uL (ref 150.0–400.0)
RBC: 4.57 Mil/uL (ref 4.22–5.81)
RDW: 16.9 % — ABNORMAL HIGH (ref 11.5–15.5)
WBC: 7.6 10*3/uL (ref 4.0–10.5)

## 2023-06-11 LAB — HEMOGLOBIN A1C: Hgb A1c MFr Bld: 6.1 % (ref 4.6–6.5)

## 2023-06-11 MED ORDER — TELMISARTAN 40 MG PO TABS: 40.0000 mg | ORAL_TABLET | Freq: Every day | ORAL | 2 refills | Status: DC

## 2023-06-11 NOTE — Patient Instructions (Signed)
 Consider trial of Zinc Oxide ointment for groin irritation.

## 2023-06-11 NOTE — Progress Notes (Signed)
 Established Patient Office Visit  Subjective   Patient ID: Micheal Graham, male    DOB: 1955-10-22  Age: 68 y.o. MRN: [id number]  Chief Complaint  Patient presents with   Medication Consultation   Rash    Patient complains of rash on groin, x2 weeks     HPI   Kristion is here with concerns about several items as follows  Past history of hypertension.  He had COVID several years ago and during that time lost some weight and blood pressure dropped significantly and we are able to take him off medication.  Since that time his blood pressures been inching back up.  He is had several recent systolics mostly 140s.  Diastolics generally well-controlled.  No significant headaches.  Second issue is he would like to discuss possible weight loss medications.  He specifically had questions regarding GLP-1 medications.  He does not have any history of known recent hyperglycemia but back in 2021 did have A1c up to 8.3%.  He is struggled in the past to lose weight particularly as he has gotten older.  Third issue is intermittent irritation groin region.  He has been recently using some Vaseline.  Occasional mild discomfort.  No pruritus.  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   severe-right shin lower (EXC)   Atypical nevus 01/16/2013   moderate-right lower back (WS)   Atypical nevus 05/29/2013   moderate-mid back    Basal cell carcinoma 07/15/1999   right neck (EXC)   Basal cell carcinoma 08/29/1999   sclerosis- right neck (MOHS)   Basal cell carcinoma 02/15/2008   right shin-(CX35FU)   Basal cell carcinoma 08/13/2014   nod-right mid back (CX35FU), nod-right side nose (EXC)   Basal cell carcinoma 08/23/2014   nod-right side nose sup (MOHS)   Basal cell carcinoma 08/12/2015   sup/nod-left ear rim (CX35FU)   Basal cell carcinoma 01/22/2016   sup-right scapula (CX35FU)   Basal cell carcinoma 06/09/2017   sup/nod-left post shoulder (CX35FU)   Basal  cell carcinoma 11/17/2018   sup/nod-right side of nose (CX35FU)   Basal cell carcinoma 01/26/2019   nod-right side nose bridge, inf   BCC (basal cell carcinoma) 06/19/2019   below right ear-cx3 and Excision   Cancer (HCC) 2013   right shin   Celiac disease 01/11/2020   Chronic kidney disease    Cognitive impairment, mild, so stated 01/23/2014   Colon polyps    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   Hypertension    Migraines    Nodular infiltrative basal cell carcinoma (BCC) 10/28/2020   Right Submandibular area   Squamous cell carcinoma of skin 09/07/2018   in situ-right mid shin (CX35FU)   Past Surgical History:  Procedure Laterality Date   ABDOMINAL HERNIA REPAIR  04/06/2010   APPENDECTOMY  04/06/1972   ENDOVENOUS ABLATION SAPHENOUS VEIN W/ LASER Left 11/05/2021   endovenous laser ablation left greater saphenous vein and stab phlebectomy > 20 incisions left leg by Cari Caraway MD   left knee  04/06/2009   surgery   VARICOSE VEIN SURGERY      reports that he quit smoking about 23 years ago. His smoking use included cigarettes. He started smoking about 53 years ago. He has a 60 pack-year smoking history. He has never used smokeless tobacco. He reports that he does not drink alcohol and does not use drugs. family history includes Cancer in his maternal uncle; Heart disease in his father; Hypertension in  his mother; Stroke in his paternal grandfather. Allergies  Allergen Reactions   Gluten Meal Diarrhea    Review of Systems  Constitutional:  Negative for malaise/fatigue.  Eyes:  Negative for blurred vision.  Respiratory:  Negative for shortness of breath.   Cardiovascular:  Negative for chest pain.  Neurological:  Negative for dizziness, weakness and headaches.      Objective:     BP (!) 150/92 (BP Location: Left Arm, Patient Position: Sitting, Cuff Size: Normal)   Pulse 68   Temp 98.2 F (36.8 C) (Oral)   Wt 229 lb 12.8 oz (104.2 kg)   SpO2 95%   BMI 31.17 kg/m  BP  Readings from Last 3 Encounters:  06/11/23 (!) 150/92  04/19/23 (!) 150/84  03/02/23 (!) 148/80   Wt Readings from Last 3 Encounters:  06/11/23 229 lb 12.8 oz (104.2 kg)  04/19/23 233 lb 3.2 oz (105.8 kg)  03/02/23 231 lb 1.6 oz (104.8 kg)      Physical Exam Vitals reviewed.  Constitutional:      Appearance: He is well-developed.  HENT:     Right Ear: External ear normal.     Left Ear: External ear normal.  Eyes:     Pupils: Pupils are equal, round, and reactive to light.  Neck:     Thyroid: No thyromegaly.  Cardiovascular:     Rate and Rhythm: Normal rate and regular rhythm.  Pulmonary:     Effort: Pulmonary effort is normal. No respiratory distress.     Breath sounds: Normal breath sounds. No wheezing or rales.  Musculoskeletal:     Cervical back: Neck supple.  Skin:    Comments: Groin region is examined bilaterally.  No erythema.  He has little bit of dryness and skin cracking very superficially.  Nontender.  No evidence to suggest yeast or fungal infection  Neurological:     Mental Status: He is alert and oriented to person, place, and time.      No results found for any visits on 06/11/23.  Last CBC Lab Results  Component Value Date   WBC 8.1 06/03/2020   HGB 14.5 06/03/2020   HCT 42.9 06/03/2020   MCV 85.9 06/03/2020   MCH 32.7 02/05/2020   RDW 16.1 (H) 06/03/2020   PLT 151.0 06/03/2020   Last metabolic panel Lab Results  Component Value Date   GLUCOSE 124 (H) 02/05/2020   NA 139 02/05/2020   K 4.7 02/05/2020   CL 105 02/05/2020   CO2 26 02/05/2020   BUN 35 (H) 02/05/2020   CREATININE 1.70 (H) 02/05/2020   GFRNONAA 52 (L) 01/17/2020   CALCIUM 9.4 02/05/2020   PROT 6.8 02/05/2020   ALBUMIN 2.5 (L) 01/16/2020   LABGLOB 3.2 01/23/2014   AGRATIO 1.4 01/23/2014   BILITOT 0.5 02/05/2020   ALKPHOS 67 01/16/2020   AST 18 02/05/2020   ALT 30 02/05/2020   ANIONGAP 10 01/17/2020   Last lipids Lab Results  Component Value Date   CHOL 199  06/12/2015   HDL 34.10 (L) 06/12/2015   LDLDIRECT 124.0 06/12/2015   TRIG 297 (H) 01/11/2020   CHOLHDL 6 06/12/2015   Last hemoglobin A1c Lab Results  Component Value Date   HGBA1C 5.5 06/03/2020      The ASCVD Risk score (Arnett DK, et al., 2019) failed to calculate for the following reasons:   Cannot find a previous HDL lab   Cannot find a previous total cholesterol lab    Assessment & Plan:   #1  history of hypertension.  Currently untreated.  Blood pressure has been inching back up for several months with consistent elevations at home and again here today.  Initiate Micardis 40 mg once daily.  Continue low-sodium diet.  Set up 1 month follow-up to reassess  #2 mild groin irritation.  No evidence to suggest Candida or fungal infection otherwise.  Has some mild cracking and irritation of the skin.  Try zinc oxide ointment  #3 weight gain.  He specifically had questions regarding possible GLP-1 medication.  He does have past history of hyperglycemia and we have recommend first getting some follow-up labs with A1c, CMP, lipid panel  #4 history of celiac disease.  Past history of mild anemia.  Recheck CBC   Return in about 1 month (around 07/12/2023).    Evelena Peat, MD

## 2023-06-14 ENCOUNTER — Telehealth: Payer: Self-pay

## 2023-06-14 NOTE — Telephone Encounter (Signed)
 Copied from CRM [id number]. Topic: Clinical - Lab/Test Results >> Jun 14, 2023  9:40 AM Drema Balzarine wrote: Reason for CRM: Patient returning [facility] call regarding lab results, I did read him Dr. Mar Daring note but he has more questions.

## 2023-06-14 NOTE — Telephone Encounter (Signed)
 Please see result note

## 2023-06-14 NOTE — Telephone Encounter (Signed)
Pt returning Micheal Graham call

## 2023-06-16 ENCOUNTER — Encounter: Payer: Self-pay | Admitting: Family Medicine

## 2023-06-17 MED ORDER — WEGOVY 0.25 MG/0.5ML ~~LOC~~ SOAJ: 0.2500 mg | SUBCUTANEOUS | 0 refills | Status: DC

## 2023-07-26 ENCOUNTER — Telehealth: Payer: Self-pay | Admitting: Family Medicine

## 2023-07-26 ENCOUNTER — Ambulatory Visit: Admitting: Family Medicine

## 2023-07-26 ENCOUNTER — Encounter: Payer: Self-pay | Admitting: Family Medicine

## 2023-07-26 VITALS — BP 120/74 | HR 80 | Temp 97.8°F | Wt 228.2 lb

## 2023-07-26 DIAGNOSIS — I10 Essential (primary) hypertension: Secondary | ICD-10-CM

## 2023-07-26 MED ORDER — TELMISARTAN 40 MG PO TABS: 40.0000 mg | ORAL_TABLET | Freq: Every day | ORAL | 3 refills | Status: DC

## 2023-07-26 NOTE — Telephone Encounter (Signed)
 Pt asking if Dr Darren Em would accept his grandson as a new patient, Armandina Bernard (dad's name is Athena Bland, who is also a [facility] patient).

## 2023-07-26 NOTE — Progress Notes (Signed)
 Established Patient Office Visit  Subjective   Patient ID: Micheal Graham, male    DOB: 04-Aug-1955  Age: 68 y.o. MRN: [id number]  Chief Complaint  Patient presents with   Medical Management of Chronic Issues    HPI    Strummer is seen for follow-up hypertension.  Refer to last note for details.  He has past history of hypertension but after having COVID 2021 lost some weight and blood pressures improved significantly but had recently been inching back up.  We initiated telmisartan  40 mg once daily.  Tolerating well.  Blood pressure well-controlled at this time.  No dizziness.  He had recent A1c 6.1%.  He had requested consideration for trial of GLP-1 with [employer]  but his insurance declined coverage.  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   severe-right shin lower (EXC)   Atypical nevus 01/16/2013   moderate-right lower back (WS)   Atypical nevus 05/29/2013   moderate-mid back    Basal cell carcinoma 07/15/1999   right neck (EXC)   Basal cell carcinoma 08/29/1999   sclerosis- right neck (MOHS)   Basal cell carcinoma 02/15/2008   right shin-(CX35FU)   Basal cell carcinoma 08/13/2014   nod-right mid back (CX35FU), nod-right side nose (EXC)   Basal cell carcinoma 08/23/2014   nod-right side nose sup (MOHS)   Basal cell carcinoma 08/12/2015   sup/nod-left ear rim (CX35FU)   Basal cell carcinoma 01/22/2016   sup-right scapula (CX35FU)   Basal cell carcinoma 06/09/2017   sup/nod-left post shoulder (CX35FU)   Basal cell carcinoma 11/17/2018   sup/nod-right side of nose (CX35FU)   Basal cell carcinoma 01/26/2019   nod-right side nose bridge, inf   BCC (basal cell carcinoma) 06/19/2019   below right ear-cx3 and Excision   Cancer (HCC) 2013   right shin   Celiac disease 01/11/2020   Chronic kidney disease    Cognitive impairment, mild, so stated 01/23/2014   Colon polyps    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   Hypertension    Migraines     Nodular infiltrative basal cell carcinoma (BCC) 10/28/2020   Right Submandibular area   Squamous cell carcinoma of skin 09/07/2018   in situ-right mid shin (CX35FU)   Past Surgical History:  Procedure Laterality Date   ABDOMINAL HERNIA REPAIR  04/06/2010   APPENDECTOMY  04/06/1972   ENDOVENOUS ABLATION SAPHENOUS VEIN W/ LASER Left 11/05/2021   endovenous laser ablation left greater saphenous vein and stab phlebectomy > 20 incisions left leg by Kirtland Perfect MD   left knee  04/06/2009   surgery   VARICOSE VEIN SURGERY      reports that he quit smoking about 23 years ago. His smoking use included cigarettes. He started smoking about 53 years ago. He has a 60 pack-year smoking history. He has never used smokeless tobacco. He reports that he does not drink alcohol and does not use drugs. family history includes Cancer in his maternal uncle; Heart disease in his father; Hypertension in his mother; Stroke in his paternal grandfather. Allergies  Allergen Reactions   Gluten Meal Diarrhea    Review of Systems  Constitutional:  Negative for malaise/fatigue.  Eyes:  Negative for blurred vision.  Respiratory:  Negative for shortness of breath.   Cardiovascular:  Negative for chest pain.  Neurological:  Negative for dizziness, weakness and headaches.      Objective:     BP 120/74 (BP Location: Left Arm, Patient Position: Sitting, Cuff  Size: Normal)   Pulse 80   Temp 97.8 F (36.6 C) (Oral)   Wt 228 lb 3.2 oz (103.5 kg)   SpO2 95%   BMI 30.95 kg/m  BP Readings from Last 3 Encounters:  07/26/23 120/74  06/11/23 (!) 150/92  04/19/23 (!) 150/84   Wt Readings from Last 3 Encounters:  07/26/23 228 lb 3.2 oz (103.5 kg)  06/11/23 229 lb 12.8 oz (104.2 kg)  04/19/23 233 lb 3.2 oz (105.8 kg)      Physical Exam Vitals reviewed.  Constitutional:      Appearance: He is well-developed.  Eyes:     Pupils: Pupils are equal, round, and reactive to light.  Neck:     Thyroid : No  thyromegaly.  Cardiovascular:     Rate and Rhythm: Normal rate and regular rhythm.  Pulmonary:     Effort: Pulmonary effort is normal. No respiratory distress.     Breath sounds: Normal breath sounds. No wheezing or rales.  Musculoskeletal:     Cervical back: Neck supple.     Right lower leg: No edema.     Left lower leg: No edema.  Neurological:     Mental Status: He is alert and oriented to person, place, and time.      No results found for any visits on 07/26/23.  Last CBC Lab Results  Component Value Date   WBC 7.6 06/11/2023   HGB 14.3 06/11/2023   HCT 42.7 06/11/2023   MCV 93.4 06/11/2023   MCH 32.7 02/05/2020   RDW 16.9 (H) 06/11/2023   PLT 157.0 06/11/2023   Last metabolic panel Lab Results  Component Value Date   GLUCOSE 116 (H) 06/11/2023   NA 139 06/11/2023   K 4.8 06/11/2023   CL 104 06/11/2023   CO2 26 06/11/2023   BUN 19 06/11/2023   CREATININE 1.45 06/11/2023   GFR 49.20 (L) 06/11/2023   CALCIUM  9.0 06/11/2023   PROT 7.4 06/11/2023   ALBUMIN 4.3 06/11/2023   LABGLOB 3.2 01/23/2014   AGRATIO 1.4 01/23/2014   BILITOT 0.3 06/11/2023   ALKPHOS 127 (H) 06/11/2023   AST 14 06/11/2023   ALT 15 06/11/2023   ANIONGAP 10 01/17/2020   Last lipids Lab Results  Component Value Date   CHOL 180 06/11/2023   HDL 38.60 (L) 06/11/2023   LDLCALC 87 06/11/2023   LDLDIRECT 124.0 06/12/2015   TRIG 275.0 (H) 06/11/2023   CHOLHDL 5 06/11/2023   Last hemoglobin A1c Lab Results  Component Value Date   HGBA1C 6.1 06/11/2023   Last thyroid  functions Lab Results  Component Value Date   TSH 2.41 04/21/2021      The 10-year ASCVD risk score (Arnett DK, et al., 2019) is: 18.7%    Assessment & Plan:   Problem List Items Addressed This Visit       Unprioritized   Hypertension - Primary   Relevant Medications   telmisartan  (MICARDIS ) 40 MG tablet  Improved following recent initiation of telmisartan  40 mg daily.  Continue low-sodium diet and continue  current medication.  Set up 9-month follow-up.  Recheck basic metabolic panel at follow-up along with repeat A1c  Return in about 6 months (around 01/25/2024).    Glean Lamy, MD

## 2023-07-27 NOTE — Telephone Encounter (Signed)
 When patient calls for scheduling, please schedule as new patient with [facility] as he has given his approval

## 2023-08-16 ENCOUNTER — Other Ambulatory Visit: Payer: Self-pay | Admitting: Family Medicine

## 2023-08-17 MED ORDER — SERTRALINE HCL 100 MG PO TABS: 100.0000 mg | ORAL_TABLET | Freq: Every day | ORAL | 0 refills | Status: DC

## 2023-08-26 ENCOUNTER — Other Ambulatory Visit: Payer: Self-pay | Admitting: Family Medicine

## 2023-08-26 DIAGNOSIS — F339 Major depressive disorder, recurrent, unspecified: Secondary | ICD-10-CM

## 2023-09-06 ENCOUNTER — Encounter: Payer: Self-pay | Admitting: Family Medicine

## 2023-10-04 ENCOUNTER — Encounter: Payer: Self-pay | Admitting: Family Medicine

## 2023-10-04 ENCOUNTER — Ambulatory Visit: Admitting: Family Medicine

## 2023-10-04 VITALS — BP 110/60 | HR 90 | Temp 97.7°F | Wt 224.6 lb

## 2023-10-04 DIAGNOSIS — I10 Essential (primary) hypertension: Secondary | ICD-10-CM | POA: Diagnosis not present

## 2023-10-04 NOTE — Progress Notes (Signed)
 Established Patient Office Visit  Subjective   Patient ID: Micheal Graham, male    DOB: [birth year]  Age: 69 y.o. MRN: [id number]  Chief Complaint  Patient presents with   Medical Management of Chronic Issues    HPI   Colin has history of hypertension.  We recently had initiated telmisartan  40 mg daily.  Good compliance with therapy.  For about the past month, after having some initial good control, he noticed some a.m. readings around 150s systolic and around 70 diastolic.  PM readings usually slightly better around 140 systolic.  This morning he had much improved blood pressure reading at home and also much improved here.  Has had some recent headaches though not consistently.  No dizziness.  No peripheral edema.  Has had some nonspecific dyspepsia symptoms.  He has celiac disease and is very diligent with watching gluten's.  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   severe-right shin lower (EXC)   Atypical nevus 01/16/2013   moderate-right lower back (WS)   Atypical nevus 05/29/2013   moderate-mid back    Basal cell carcinoma 07/15/1999   right neck (EXC)   Basal cell carcinoma 08/29/1999   sclerosis- right neck (MOHS)   Basal cell carcinoma 02/15/2008   right shin-(CX35FU)   Basal cell carcinoma 08/13/2014   nod-right mid back (CX35FU), nod-right side nose (EXC)   Basal cell carcinoma 08/23/2014   nod-right side nose sup (MOHS)   Basal cell carcinoma 08/12/2015   sup/nod-left ear rim (CX35FU)   Basal cell carcinoma 01/22/2016   sup-right scapula (CX35FU)   Basal cell carcinoma 06/09/2017   sup/nod-left post shoulder (CX35FU)   Basal cell carcinoma 11/17/2018   sup/nod-right side of nose (CX35FU)   Basal cell carcinoma 01/26/2019   nod-right side nose bridge, inf   BCC (basal cell carcinoma) 06/19/2019   below right ear-cx3 and Excision   Cancer (HCC) 2013   right shin   Celiac disease 01/11/2020   Chronic kidney disease     Cognitive impairment, mild, so stated 01/23/2014   Colon polyps    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   Hypertension    Migraines    Nodular infiltrative basal cell carcinoma (BCC) 10/28/2020   Right Submandibular area   Squamous cell carcinoma of skin 09/07/2018   in situ-right mid shin (CX35FU)   Past Surgical History:  Procedure Laterality Date   ABDOMINAL HERNIA REPAIR  04/06/2010   APPENDECTOMY  04/06/1972   ENDOVENOUS ABLATION SAPHENOUS VEIN W/ LASER Left 11/05/2021   endovenous laser ablation left greater saphenous vein and stab phlebectomy > 20 incisions left leg by Medford Blade MD   left knee  04/06/2009   surgery   VARICOSE VEIN SURGERY      reports that he quit smoking about 24 years ago. His smoking use included cigarettes. He started smoking about 54 years ago. He has a 60 pack-year smoking history. He has never used smokeless tobacco. He reports that he does not drink alcohol and does not use drugs. family history includes Cancer in his maternal uncle; Heart disease in his father; Hypertension in his mother; Stroke in his paternal grandfather. Allergies  Allergen Reactions   Gluten Meal Diarrhea    Review of Systems  Eyes:  Negative for blurred vision.  Respiratory:  Negative for shortness of breath.   Cardiovascular:  Negative for chest pain.  Neurological:  Negative for dizziness and weakness.      Objective:  BP 110/60 (BP Location: Left Arm, Patient Position: Sitting, Cuff Size: Normal)   Pulse 90   Temp 97.7 F (36.5 C) (Oral)   Wt 224 lb 9.6 oz (101.9 kg)   SpO2 98%   BMI 30.46 kg/m  BP Readings from Last 3 Encounters:  10/04/23 110/60  07/26/23 120/74  06/11/23 (!) 150/92   Wt Readings from Last 3 Encounters:  10/04/23 224 lb 9.6 oz (101.9 kg)  07/26/23 228 lb 3.2 oz (103.5 kg)  06/11/23 229 lb 12.8 oz (104.2 kg)      Physical Exam Vitals reviewed.  Constitutional:      General: He is not in acute distress.    Appearance: He is not  ill-appearing.   Cardiovascular:     Rate and Rhythm: Normal rate and regular rhythm.  Pulmonary:     Effort: Pulmonary effort is normal.     Breath sounds: Normal breath sounds. No wheezing or rales.   Musculoskeletal:     Right lower leg: No edema.     Left lower leg: No edema.   Neurological:     Mental Status: He is alert.      No results found for any visits on 10/04/23.  Last CBC Lab Results  Component Value Date   WBC 7.6 06/11/2023   HGB 14.3 06/11/2023   HCT 42.7 06/11/2023   MCV 93.4 06/11/2023   MCH 32.7 02/05/2020   RDW 16.9 (H) 06/11/2023   PLT 157.0 06/11/2023   Last metabolic panel Lab Results  Component Value Date   GLUCOSE 116 (H) 06/11/2023   NA 139 06/11/2023   K 4.8 06/11/2023   CL 104 06/11/2023   CO2 26 06/11/2023   BUN 19 06/11/2023   CREATININE 1.45 06/11/2023   GFR 49.20 (L) 06/11/2023   CALCIUM  9.0 06/11/2023   PROT 7.4 06/11/2023   ALBUMIN 4.3 06/11/2023   LABGLOB 3.2 01/23/2014   AGRATIO 1.4 01/23/2014   BILITOT 0.3 06/11/2023   ALKPHOS 127 (H) 06/11/2023   AST 14 06/11/2023   ALT 15 06/11/2023   ANIONGAP 10 01/17/2020   Last lipids Lab Results  Component Value Date   CHOL 180 06/11/2023   HDL 38.60 (L) 06/11/2023   LDLCALC 87 06/11/2023   LDLDIRECT 124.0 06/12/2015   TRIG 275.0 (H) 06/11/2023   CHOLHDL 5 06/11/2023   Last hemoglobin A1c Lab Results  Component Value Date   HGBA1C 6.1 06/11/2023      The 10-year ASCVD risk score (Arnett DK, et al., 2019) is: 16.2%    Assessment & Plan:   Hypertension.  Controlled by today's reading but he has had several elevated readings at home.  He will continue low-sodium diet and close monitoring.  Be in touch with consistently greater than 140 systolic.  If blood pressures remain up consider addition of HCTZ to Micardis    Wolm Scarlet, MD

## 2023-10-04 NOTE — Patient Instructions (Signed)
Monitor blood pressure and be in touch if consistently > 140 systolic (top number).    

## 2023-10-25 ENCOUNTER — Other Ambulatory Visit: Payer: Self-pay | Admitting: Family Medicine

## 2023-10-25 ENCOUNTER — Encounter: Payer: Self-pay | Admitting: Family Medicine

## 2023-10-25 DIAGNOSIS — F339 Major depressive disorder, recurrent, unspecified: Secondary | ICD-10-CM

## 2023-11-17 ENCOUNTER — Encounter: Payer: Self-pay | Admitting: Family Medicine

## 2023-11-17 ENCOUNTER — Ambulatory Visit: Admitting: Family Medicine

## 2023-11-17 VITALS — BP 130/80 | HR 71 | Temp 97.9°F | Wt 230.2 lb

## 2023-11-17 DIAGNOSIS — F419 Anxiety disorder, unspecified: Secondary | ICD-10-CM | POA: Diagnosis not present

## 2023-11-17 DIAGNOSIS — I10 Essential (primary) hypertension: Secondary | ICD-10-CM

## 2023-11-17 DIAGNOSIS — F339 Major depressive disorder, recurrent, unspecified: Secondary | ICD-10-CM | POA: Diagnosis not present

## 2023-11-17 DIAGNOSIS — N1832 Chronic kidney disease, stage 3b: Secondary | ICD-10-CM | POA: Insufficient documentation

## 2023-11-17 MED ORDER — BUPROPION HCL ER (XL) 300 MG PO TB24: 300.0000 mg | ORAL_TABLET | Freq: Every day | ORAL | 3 refills | Status: AC

## 2023-11-17 MED ORDER — SERTRALINE HCL 100 MG PO TABS: 100.0000 mg | ORAL_TABLET | Freq: Every day | ORAL | 3 refills | Status: AC

## 2023-11-17 MED ORDER — TELMISARTAN 40 MG PO TABS: 40.0000 mg | ORAL_TABLET | Freq: Every day | ORAL | 3 refills | Status: AC

## 2023-11-17 NOTE — Progress Notes (Signed)
 Established Patient Office Visit  Subjective   Patient ID: Micheal Graham, male    DOB: Jul 16, 1955  Age: 68 y.o. MRN: [id number]  Chief Complaint  Patient presents with   Medication Refill    HPI   Micheal Graham has history of hypertension, celiac disease, carpal tunnel syndrome, idiopathic peripheral neuropathy, chronic kidney disease stage III, history of kidney stones.  Just retired 2 weeks ago.  He is seen today with what he describes as difficulty relaxing .  He does take Wellbutrin  and sertraline  regularly.  In spite of this he states he has difficulty feeling relaxed.  He specifically is inquiring about medication such as Xanax .  He denies any 1 specific stressor though he stays extremely busy caring for his wife and helping with his children among other things.  Quit smoking about 20 years ago after 30-year history.  He states he felt less anxious when smoking.  Brings in labs that were done recently from another place.  He had signed up to be a kidney donor but was told that he was not a candidate because of his GFR of 38.  Looks like this is just below his usual baseline GFR and has been relatively stable for several years.  Last A1c 5.9%.  He has hypertension currently treated with telmisartan .  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   severe-right shin lower (EXC)   Atypical nevus 01/16/2013   moderate-right lower back (WS)   Atypical nevus 05/29/2013   moderate-mid back    Basal cell carcinoma 07/15/1999   right neck (EXC)   Basal cell carcinoma 08/29/1999   sclerosis- right neck (MOHS)   Basal cell carcinoma 02/15/2008   right shin-(CX35FU)   Basal cell carcinoma 08/13/2014   nod-right mid back (CX35FU), nod-right side nose (EXC)   Basal cell carcinoma 08/23/2014   nod-right side nose sup (MOHS)   Basal cell carcinoma 08/12/2015   sup/nod-left ear rim (CX35FU)   Basal cell carcinoma 01/22/2016   sup-right scapula (CX35FU)    Basal cell carcinoma 06/09/2017   sup/nod-left post shoulder (CX35FU)   Basal cell carcinoma 11/17/2018   sup/nod-right side of nose (CX35FU)   Basal cell carcinoma 01/26/2019   nod-right side nose bridge, inf   BCC (basal cell carcinoma) 06/19/2019   below right ear-cx3 and Excision   Cancer (HCC) 2013   right shin   Celiac disease 01/11/2020   Chronic kidney disease    Cognitive impairment, mild, so stated 01/23/2014   Colon polyps    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   Hypertension    Migraines    Nodular infiltrative basal cell carcinoma (BCC) 10/28/2020   Right Submandibular area   Squamous cell carcinoma of skin 09/07/2018   in situ-right mid shin (CX35FU)   Past Surgical History:  Procedure Laterality Date   ABDOMINAL HERNIA REPAIR  04/06/2010   APPENDECTOMY  04/06/1972   ENDOVENOUS ABLATION SAPHENOUS VEIN W/ LASER Left 11/05/2021   endovenous laser ablation left greater saphenous vein and stab phlebectomy > 20 incisions left leg by Medford Blade MD   left knee  04/06/2009   surgery   VARICOSE VEIN SURGERY      reports that he quit smoking about 24 years ago. His smoking use included cigarettes. He started smoking about 54 years ago. He has a 60 pack-year smoking history. He has never used smokeless tobacco. He reports that he does not drink alcohol and does not use drugs. family  history includes Cancer in his maternal uncle; Heart disease in his father; Hypertension in his mother; Stroke in his paternal grandfather. Allergies  Allergen Reactions   Gluten Meal Diarrhea    Review of Systems  Constitutional:  Negative for chills, fever and malaise/fatigue.  Eyes:  Negative for blurred vision.  Respiratory:  Negative for shortness of breath.   Cardiovascular:  Negative for chest pain.  Neurological:  Negative for dizziness, weakness and headaches.      Objective:     BP 130/80   Pulse 71   Temp 97.9 F (36.6 C) (Oral)   Wt 230 lb 3.2 oz (104.4 kg)   SpO2 95%    BMI 31.22 kg/m  BP Readings from Last 3 Encounters:  11/17/23 130/80  10/04/23 110/60  07/26/23 120/74   Wt Readings from Last 3 Encounters:  11/17/23 230 lb 3.2 oz (104.4 kg)  10/04/23 224 lb 9.6 oz (101.9 kg)  07/26/23 228 lb 3.2 oz (103.5 kg)      Physical Exam Vitals reviewed.  Constitutional:      General: He is not in acute distress.    Appearance: He is well-developed. He is not ill-appearing.  Eyes:     Pupils: Pupils are equal, round, and reactive to light.  Neck:     Thyroid : No thyromegaly.  Cardiovascular:     Rate and Rhythm: Normal rate and regular rhythm.  Pulmonary:     Effort: Pulmonary effort is normal. No respiratory distress.     Breath sounds: Normal breath sounds. No wheezing or rales.  Musculoskeletal:     Cervical back: Neck supple.     Right lower leg: No edema.     Left lower leg: No edema.  Neurological:     Mental Status: He is alert and oriented to person, place, and time.      No results found for any visits on 11/17/23.  Last CBC Lab Results  Component Value Date   WBC 7.6 06/11/2023   HGB 14.3 06/11/2023   HCT 42.7 06/11/2023   MCV 93.4 06/11/2023   MCH 32.7 02/05/2020   RDW 16.9 (H) 06/11/2023   PLT 157.0 06/11/2023   Last metabolic panel Lab Results  Component Value Date   GLUCOSE 116 (H) 06/11/2023   NA 139 06/11/2023   K 4.8 06/11/2023   CL 104 06/11/2023   CO2 26 06/11/2023   BUN 19 06/11/2023   CREATININE 1.45 06/11/2023   GFR 49.20 (L) 06/11/2023   CALCIUM  9.0 06/11/2023   PROT 7.4 06/11/2023   ALBUMIN 4.3 06/11/2023   LABGLOB 3.2 01/23/2014   AGRATIO 1.4 01/23/2014   BILITOT 0.3 06/11/2023   ALKPHOS 127 (H) 06/11/2023   AST 14 06/11/2023   ALT 15 06/11/2023   ANIONGAP 10 01/17/2020   Last lipids Lab Results  Component Value Date   CHOL 180 06/11/2023   HDL 38.60 (L) 06/11/2023   LDLCALC 87 06/11/2023   LDLDIRECT 124.0 06/12/2015   TRIG 275.0 (H) 06/11/2023   CHOLHDL 5 06/11/2023   Last  hemoglobin A1c Lab Results  Component Value Date   HGBA1C 6.1 06/11/2023   Last thyroid  functions Lab Results  Component Value Date   TSH 2.41 04/21/2021      The 10-year ASCVD risk score (Arnett DK, et al., 2019) is: 21.3%    Assessment & Plan:   #1 history of recurrent depression currently stable on Wellbutrin  and sertraline .  Refills provided for 1 year.  #2 hypertension currently controlled with telmisartan  40  mg daily.  Refills for 1 year  #3 chronic kidney disease stage IIIb.  He had several questions regarding this today.  We discussed importance of avoiding potentially nephrotoxic medications such as Advil and Aleve .  Discussed importance of adequate hydration and good blood pressure control as well as good A1c control.  Recheck basic metabolic panel at follow-up.  Recent GFR relatively stable.  #4 anxiety symptoms.  He states he has difficulty relaxing.  He specifically inquired about benzodiazepines.  We have strongly discouraged for several reasons including: High risk of falls especially in view of his chronic neuropathy and increasing age, potential for increased memory loss, and increased abuse potential.  We strongly encouraged him to work on nonpharmacologic things including adequate sleep, exercise, deep breathing exercises, etc.  Consider counseling if anxiety symptoms persist.  Wolm Scarlet, MD

## 2023-12-29 ENCOUNTER — Encounter: Payer: Self-pay | Admitting: Family Medicine

## 2024-01-25 ENCOUNTER — Encounter: Payer: Self-pay | Admitting: Family Medicine

## 2024-01-25 ENCOUNTER — Ambulatory Visit: Admitting: Family Medicine

## 2024-01-25 VITALS — BP 134/80 | HR 89 | Temp 97.9°F | Wt 228.7 lb

## 2024-01-25 DIAGNOSIS — I10 Essential (primary) hypertension: Secondary | ICD-10-CM | POA: Diagnosis not present

## 2024-01-25 DIAGNOSIS — F419 Anxiety disorder, unspecified: Secondary | ICD-10-CM

## 2024-01-25 DIAGNOSIS — F339 Major depressive disorder, recurrent, unspecified: Secondary | ICD-10-CM | POA: Diagnosis not present

## 2024-01-25 MED ORDER — ALPRAZOLAM 1 MG PO TABS: 1.0000 mg | ORAL_TABLET | Freq: Every day | ORAL | 2 refills | Status: AC | PRN

## 2024-01-25 NOTE — Progress Notes (Signed)
 Established Patient Office Visit  Subjective   Patient ID: Micheal Graham, male    DOB: 23-Mar-1956  Age: 68 y.o. MRN: [id number]  Chief Complaint  Patient presents with   Medical Management of Chronic Issues    HPI    Micheal Graham is seen to discuss anxiety issues.  He does have history of recurrent depression currently managed with Wellbutrin  and sertraline .  He states his depression is as good as it has been in quite some time.  However, he is having significant anxiety symptoms.  He retired in July.  He has had multiple stressors since then.  His mother is in a memory care unit and has advanced dementia age 77.  He has a son who recently purchased a house and he has been helping him significantly with some maintenance and repairs.  He has a daughter whose had 2 recent knee surgeries and is looking out of third soon and he will likely need to stay with her for several months as she recovers.  His wife also has significant needs and probably has some early dementia and he has had to take over managing finances.  He feels frequently overwhelmed at times.  He specifically is inquiring about short-term use of alprazolam  as needed.  He has taken this in the past which worked well.  No history of abuse.  Does not use any alcohol.  No chronic pain medications.  No chronic sedatives.  He has hypertension currently treated with Micardis  40 mg daily.  Tolerating well.  No recent dizziness.  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   severe-right shin lower (EXC)   Atypical nevus 01/16/2013   moderate-right lower back (WS)   Atypical nevus 05/29/2013   moderate-mid back    Basal cell carcinoma 07/15/1999   right neck (EXC)   Basal cell carcinoma 08/29/1999   sclerosis- right neck (MOHS)   Basal cell carcinoma 02/15/2008   right shin-(CX35FU)   Basal cell carcinoma 08/13/2014   nod-right mid back (CX35FU), nod-right side nose (EXC)   Basal cell carcinoma  08/23/2014   nod-right side nose sup (MOHS)   Basal cell carcinoma 08/12/2015   sup/nod-left ear rim (CX35FU)   Basal cell carcinoma 01/22/2016   sup-right scapula (CX35FU)   Basal cell carcinoma 06/09/2017   sup/nod-left post shoulder (CX35FU)   Basal cell carcinoma 11/17/2018   sup/nod-right side of nose (CX35FU)   Basal cell carcinoma 01/26/2019   nod-right side nose bridge, inf   BCC (basal cell carcinoma) 06/19/2019   below right ear-cx3 and Excision   Cancer (HCC) 2013   right shin   Celiac disease 01/11/2020   Chronic kidney disease    Cognitive impairment, mild, so stated 01/23/2014   Colon polyps    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   Hypertension    Migraines    Nodular infiltrative basal cell carcinoma (BCC) 10/28/2020   Right Submandibular area   Squamous cell carcinoma of skin 09/07/2018   in situ-right mid shin (CX35FU)   Past Surgical History:  Procedure Laterality Date   ABDOMINAL HERNIA REPAIR  04/06/2010   APPENDECTOMY  04/06/1972   ENDOVENOUS ABLATION SAPHENOUS VEIN W/ LASER Left 11/05/2021   endovenous laser ablation left greater saphenous vein and stab phlebectomy > 20 incisions left leg by Medford Blade MD   left knee  04/06/2009   surgery   VARICOSE VEIN SURGERY      reports that he quit smoking about 24  years ago. His smoking use included cigarettes. He started smoking about 54 years ago. He has a 60 pack-year smoking history. He has never used smokeless tobacco. He reports that he does not drink alcohol and does not use drugs. family history includes Cancer in his maternal uncle; Heart disease in his father; Hypertension in his mother; Stroke in his paternal grandfather. Allergies  Allergen Reactions   Gluten Meal Diarrhea    Review of Systems  Constitutional:  Negative for malaise/fatigue.  Eyes:  Negative for blurred vision.  Respiratory:  Negative for shortness of breath.   Cardiovascular:  Negative for chest pain.  Neurological:  Negative for  dizziness, weakness and headaches.  Psychiatric/Behavioral:  Negative for substance abuse and suicidal ideas. The patient is nervous/anxious.       Objective:     BP 134/80   Pulse 89   Temp 97.9 F (36.6 C) (Oral)   Wt 228 lb 11.2 oz (103.7 kg)   SpO2 97%   BMI 31.02 kg/m  BP Readings from Last 3 Encounters:  01/25/24 134/80  11/17/23 130/80  10/04/23 110/60   Wt Readings from Last 3 Encounters:  01/25/24 228 lb 11.2 oz (103.7 kg)  11/17/23 230 lb 3.2 oz (104.4 kg)  10/04/23 224 lb 9.6 oz (101.9 kg)      Physical Exam Vitals reviewed.  Constitutional:      General: He is not in acute distress.    Appearance: He is not ill-appearing.  Cardiovascular:     Rate and Rhythm: Normal rate and regular rhythm.  Pulmonary:     Effort: Pulmonary effort is normal.     Breath sounds: Normal breath sounds. No wheezing or rales.  Neurological:     Mental Status: He is alert.      No results found for any visits on 01/25/24.  Last CBC Lab Results  Component Value Date   WBC 7.6 06/11/2023   HGB 14.3 06/11/2023   HCT 42.7 06/11/2023   MCV 93.4 06/11/2023   MCH 32.7 02/05/2020   RDW 16.9 (H) 06/11/2023   PLT 157.0 06/11/2023   Last metabolic panel Lab Results  Component Value Date   GLUCOSE 116 (H) 06/11/2023   NA 139 06/11/2023   K 4.8 06/11/2023   CL 104 06/11/2023   CO2 26 06/11/2023   BUN 19 06/11/2023   CREATININE 1.45 06/11/2023   GFR 49.20 (L) 06/11/2023   CALCIUM  9.0 06/11/2023   PROT 7.4 06/11/2023   ALBUMIN 4.3 06/11/2023   LABGLOB 3.2 01/23/2014   AGRATIO 1.4 01/23/2014   BILITOT 0.3 06/11/2023   ALKPHOS 127 (H) 06/11/2023   AST 14 06/11/2023   ALT 15 06/11/2023   ANIONGAP 10 01/17/2020   Last lipids Lab Results  Component Value Date   CHOL 180 06/11/2023   HDL 38.60 (L) 06/11/2023   LDLCALC 87 06/11/2023   LDLDIRECT 124.0 06/12/2015   TRIG 275.0 (H) 06/11/2023   CHOLHDL 5 06/11/2023   Last hemoglobin A1c Lab Results  Component  Value Date   HGBA1C 6.1 06/11/2023   Last thyroid  functions Lab Results  Component Value Date   TSH 2.41 04/21/2021      The 10-year ASCVD risk score (Arnett DK, et al., 2019) is: 23.8%    Assessment & Plan:   #1 situational anxiety/stress.  We discussed nonpharmacologic management of stress and anxiety issues with adequate sleep, exercise, etc.  Handout given.  Patient already on sertraline .  He is requesting specifically alprazolam  which he has taken in  the past for severe anxiety symptoms.  We discussed risk of benzos including risk of fall, balance issues, risk of abuse, risk of mixing with other medication such as other sedative hypnotics or pain medication, risk of withdrawal symptoms with chronic use, and even potential for possible associated memory loss  -We agreed to short-term use only of alprazolam  1 mg daily as needed for severe anxiety symptoms.  He plans to take this more on a as needed basis.  We did discuss potential side effects.  #2 hypertension stable.  Continue telmisartan  40 mg daily.  Continue low-sodium diet  #3 recurrent depression improved on combination therapy with sertraline  and Wellbutrin .   Wolm Scarlet, MD
